# Patient Record
Sex: Male | Born: 1975
Health system: Southern US, Community
[De-identification: ages and names within clinical notes are randomized; demographics above are authoritative.]

## PROBLEM LIST (undated history)

## (undated) DIAGNOSIS — I10 Essential (primary) hypertension: Secondary | ICD-10-CM

## (undated) DIAGNOSIS — M5134 Other intervertebral disc degeneration, thoracic region: Secondary | ICD-10-CM

## (undated) DIAGNOSIS — E785 Hyperlipidemia, unspecified: Secondary | ICD-10-CM

## (undated) DIAGNOSIS — G4730 Sleep apnea, unspecified: Secondary | ICD-10-CM

## (undated) DIAGNOSIS — E669 Obesity, unspecified: Secondary | ICD-10-CM

## (undated) DIAGNOSIS — G039 Meningitis, unspecified: Secondary | ICD-10-CM

## (undated) DIAGNOSIS — Z8489 Family history of other specified conditions: Secondary | ICD-10-CM

## (undated) DIAGNOSIS — R0683 Snoring: Secondary | ICD-10-CM

## (undated) DIAGNOSIS — E119 Type 2 diabetes mellitus without complications: Secondary | ICD-10-CM

## (undated) DIAGNOSIS — Z973 Presence of spectacles and contact lenses: Secondary | ICD-10-CM

## (undated) DIAGNOSIS — E8881 Metabolic syndrome: Secondary | ICD-10-CM

## (undated) DIAGNOSIS — F32A Depression, unspecified: Secondary | ICD-10-CM

## (undated) DIAGNOSIS — G43909 Migraine, unspecified, not intractable, without status migrainosus: Secondary | ICD-10-CM

## (undated) DIAGNOSIS — R7303 Prediabetes: Secondary | ICD-10-CM

## (undated) HISTORY — DX: Obesity, unspecified: E66.9

## (undated) HISTORY — DX: Migraine, unspecified, not intractable, without status migrainosus: G43.909

## (undated) HISTORY — DX: Snoring: R06.83

## (undated) HISTORY — DX: Hyperlipidemia, unspecified: E78.5

## (undated) HISTORY — DX: Metabolic syndrome: E88.810

## (undated) HISTORY — DX: Meningitis, unspecified: G03.9

## (undated) HISTORY — DX: Type 2 diabetes mellitus without complications: E11.9

## (undated) HISTORY — DX: Metabolic syndrome: E88.81

## (undated) HISTORY — PX: ABDOMINAL SURGERY: SHX537

---
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
## 2005-09-01 ENCOUNTER — Encounter: Admission: RE | Admit: 2005-09-01 | Discharge: 2005-09-01 | Payer: Self-pay | Admitting: Endocrinology

## 2008-10-02 ENCOUNTER — Encounter (INDEPENDENT_AMBULATORY_CARE_PROVIDER_SITE_OTHER): Payer: Self-pay | Admitting: Surgery

## 2008-10-03 ENCOUNTER — Inpatient Hospital Stay (HOSPITAL_COMMUNITY): Admission: EM | Admit: 2008-10-03 | Discharge: 2008-10-04 | Payer: Self-pay | Admitting: Emergency Medicine

## 2008-11-13 ENCOUNTER — Ambulatory Visit: Payer: Self-pay | Admitting: Oncology

## 2008-11-25 LAB — COMPREHENSIVE METABOLIC PANEL
ALT: 24 U/L (ref 0–53)
AST: 22 U/L (ref 0–37)
Albumin: 3.9 g/dL (ref 3.5–5.2)
Alkaline Phosphatase: 79 U/L (ref 39–117)
BUN: 12 mg/dL (ref 6–23)
CO2: 26 mEq/L (ref 19–32)
Calcium: 9.1 mg/dL (ref 8.4–10.5)
Chloride: 106 mEq/L (ref 96–112)
Creatinine, Ser: 0.94 mg/dL (ref 0.40–1.50)
Glucose, Bld: 108 mg/dL — ABNORMAL HIGH (ref 70–99)
Potassium: 3.8 mEq/L (ref 3.5–5.3)
Sodium: 139 mEq/L (ref 135–145)
Total Bilirubin: 0.6 mg/dL (ref 0.3–1.2)
Total Protein: 7.4 g/dL (ref 6.0–8.3)

## 2008-11-25 LAB — CBC WITH DIFFERENTIAL/PLATELET
BASO%: 0.9 % (ref 0.0–2.0)
Basophils Absolute: 0.1 10*3/uL (ref 0.0–0.1)
EOS%: 1.1 % (ref 0.0–7.0)
Eosinophils Absolute: 0.1 10*3/uL (ref 0.0–0.5)
HCT: 43.3 % (ref 38.4–49.9)
HGB: 14.8 g/dL (ref 13.0–17.1)
LYMPH%: 21.2 % (ref 14.0–49.0)
MCH: 29.9 pg (ref 27.2–33.4)
MCHC: 34.1 g/dL (ref 32.0–36.0)
MCV: 87.8 fL (ref 79.3–98.0)
MONO#: 0.7 10*3/uL (ref 0.1–0.9)
MONO%: 6.8 % (ref 0.0–14.0)
NEUT#: 6.7 10*3/uL — ABNORMAL HIGH (ref 1.5–6.5)
NEUT%: 70 % (ref 39.0–75.0)
Platelets: 196 10*3/uL (ref 140–400)
RBC: 4.93 10*6/uL (ref 4.20–5.82)
RDW: 13.8 % (ref 11.0–14.6)
WBC: 9.6 10*3/uL (ref 4.0–10.3)
lymph#: 2 10*3/uL (ref 0.9–3.3)

## 2008-11-25 LAB — LACTATE DEHYDROGENASE: LDH: 117 U/L (ref 94–250)

## 2008-11-26 LAB — CEA: CEA: 0.5 ng/mL (ref 0.0–5.0)

## 2010-08-24 LAB — CBC
HCT: 40.4 % (ref 39.0–52.0)
HCT: 46.8 % (ref 39.0–52.0)
Hemoglobin: 13.4 g/dL (ref 13.0–17.0)
Hemoglobin: 15.9 g/dL (ref 13.0–17.0)
MCHC: 33.1 g/dL (ref 30.0–36.0)
MCHC: 34 g/dL (ref 30.0–36.0)
MCV: 88.6 fL (ref 78.0–100.0)
MCV: 90 fL (ref 78.0–100.0)
Platelets: 168 10*3/uL (ref 150–400)
Platelets: 187 10*3/uL (ref 150–400)
RBC: 4.49 MIL/uL (ref 4.22–5.81)
RBC: 5.28 MIL/uL (ref 4.22–5.81)
RDW: 13.5 % (ref 11.5–15.5)
RDW: 13.5 % (ref 11.5–15.5)
WBC: 13.7 10*3/uL — ABNORMAL HIGH (ref 4.0–10.5)
WBC: 15.1 10*3/uL — ABNORMAL HIGH (ref 4.0–10.5)

## 2010-08-24 LAB — DIFFERENTIAL
Basophils Absolute: 0.1 10*3/uL (ref 0.0–0.1)
Basophils Relative: 1 % (ref 0–1)
Eosinophils Absolute: 0.1 10*3/uL (ref 0.0–0.7)
Eosinophils Relative: 1 % (ref 0–5)
Lymphocytes Relative: 13 % (ref 12–46)
Lymphs Abs: 2 10*3/uL (ref 0.7–4.0)
Monocytes Absolute: 1 10*3/uL (ref 0.1–1.0)
Monocytes Relative: 7 % (ref 3–12)
Neutro Abs: 11.8 10*3/uL — ABNORMAL HIGH (ref 1.7–7.7)
Neutrophils Relative %: 79 % — ABNORMAL HIGH (ref 43–77)

## 2010-08-24 LAB — URINALYSIS, ROUTINE W REFLEX MICROSCOPIC
Bilirubin Urine: NEGATIVE
Glucose, UA: NEGATIVE mg/dL
Hgb urine dipstick: NEGATIVE
Ketones, ur: NEGATIVE mg/dL
Nitrite: NEGATIVE
Protein, ur: NEGATIVE mg/dL
Specific Gravity, Urine: 1.022 (ref 1.005–1.030)
Urobilinogen, UA: 0.2 mg/dL (ref 0.0–1.0)
pH: 5 (ref 5.0–8.0)

## 2010-08-24 LAB — COMPREHENSIVE METABOLIC PANEL
ALT: 29 U/L (ref 0–53)
AST: 34 U/L (ref 0–37)
Albumin: 4.3 g/dL (ref 3.5–5.2)
Alkaline Phosphatase: 87 U/L (ref 39–117)
BUN: 11 mg/dL (ref 6–23)
CO2: 25 mEq/L (ref 19–32)
Calcium: 9.3 mg/dL (ref 8.4–10.5)
Chloride: 107 mEq/L (ref 96–112)
Creatinine, Ser: 0.84 mg/dL (ref 0.4–1.5)
GFR calc Af Amer: >60 mL/min (ref >60)
GFR calc non Af Amer: >60 mL/min (ref >60)
Glucose, Bld: 87 mg/dL (ref 70–99)
Potassium: 4.7 mEq/L (ref 3.5–5.1)
Sodium: 140 mEq/L (ref 135–145)
Total Bilirubin: 1.2 mg/dL (ref 0.3–1.2)
Total Protein: 7.6 g/dL (ref 6.0–8.3)

## 2010-08-24 LAB — CREATININE, SERUM
Creatinine, Ser: 1.04 mg/dL (ref 0.4–1.5)
GFR calc Af Amer: >60 mL/min (ref >60)
GFR calc non Af Amer: >60 mL/min (ref >60)

## 2010-08-24 LAB — POTASSIUM: Potassium: 3.7 mEq/L (ref 3.5–5.1)

## 2010-09-28 NOTE — H&P (Signed)
Christian [name], Christian [name] NO.:  [id number]   MEDICAL RECORD NO.:  [id number]          PATIENT TYPE:  EMS   LOCATION:  ED                           FACILITY:  [facility]   PHYSICIAN:  Ardeth Sportsman, MD     DATE OF BIRTH:  10/25/1975   DATE OF ADMISSION:  10/02/2008  DATE OF DISCHARGE:                              HISTORY & PHYSICAL   PRIMARY CARE PHYSICIAN:  Dr. Juleen China with [facility]  Also [name], [name]   SURGEON:  Dr. Michaell Cowing.   REFERRING PHYSICIAN:  Dr. Cathren Laine.   REASON FOR CONSULTATION:  Acute appendicitis.   HISTORY OF PRESENT ILLNESS:  Christian [name] is a 35 year old obese male with  hypertension who has no prior GI problems, but yesterday started having  severe abdominal pain that was periumbilical that intensified and  radiated to the right lower quadrant.  He went and saw his primary care  physician, who was concerned about possibility of appendicitis, and sent  the patient for evaluation.  The CT scan confirmed it.  Based on  concerns, the ER doctors have asked me to come evaluate.   The patient denies any sick contacts or travel history.  No history of  inflammatory bowel disease or bowel syndrome.  No history of prior  antibiotics.  No history of any bowel surgery in the past.  No family  history of this.   Normally, he can walk a couple of miles without any difficulty.  No  history of cardiopulmonary issues that he recalls aside from the  hypertension and obesity.  He does occasionally drink alcohol, though no  heavy drinking.  No history of pancreatitis.  No hematochezia, melena,  or dysphagia, or GERD.   PAST MEDICAL HISTORY:  1. Hypertension.  2. Obesity.  3. Anxiety.   PAST SURGICAL HISTORY:  Negative.   SOCIAL HISTORY:  No tobacco or drug use.  Occasional uses alcohol.  His  mother and wife are at bedside.  He is in a stable relationship.   FAMILY HISTORY:  Negative for any irritable bowel, bowel disease, or  GI  symptoms that he recalls or any early cardiopulmonary disease.   ALLERGIES:  None.   MEDICATIONS:  Benicar 20/12.5 daily.   REVIEW OF SYSTEMS:  As noted per HPI.  In general, no fevers or chills,  sweats, no weight gain or weight loss.  EYES:  Negative.  ENT:  Negative.  CARDIAC:  Negative.  RESPIRATORY:  Negative.  Genitourinary,  testicular, breast, heme, lymph are negative.  Cardiovascular, note he  does have hypertension but it is well controlled at this point per the  patient.  GI as noted per HPI.  Dermatologic, psych, hepatic, renal,  endocrine otherwise negative.   PHYSICAL EXAM:  VITAL SIGNS:  Temperature 98.1, blood pressure 136/97,  pulse 90, respirations 20.  Pain was 10/10 but is down to about 4/10 on  IV narcotics.  GENERAL:  He is a well-developed, well-nourished obese male lying in bed  very still, not frankly toxic, but uncomfortable.  EYES:  Pupils equal, round, and reactive to light.  Extraocular  movements intact.  Sclerae not icteric or injected.  NEUROLOGIC:  Cranial nerves 2-12 are intact.  Hand grip is 5/5 and  equal, symmetric.  No resting or intention tremors.  HEENT:  He is normocephalic.  Mucous membranes dry.  Nose and oropharynx  clear.  LYMPHATICS:  No head, neck, axillary, or groin lymphadenopathy.  CHEST:  Clear to auscultation bilaterally.  No wheeze, rales, or  rhonchi.  No pain to rub or sternal compression.  HEART:  Regular rate and rhythm.  No murmurs, rubs, or gallops.  ABDOMEN:  Obese but soft.  He is very tender in his right lower quadrant  with voluntary guarding.  He has reproduction with cough and bed shake.  No real Rovsing sign or obturator's sign or psoas sign.  EXTERNAL GENITALIA:  NEMG, No inguinal hernias.  RECTAL:  Deferred.  SKIN:  No obvious petechia.  No caput medusae.  No purpura.  PSYCH:  Pleasant and interactive.  No evidence of any dementia,  delirium, psychosis, or paranoia.   DIAGNOSTIC STUDIES:  He has a white  count of 15.1, urinalysis negative.   He has a CAT scan that shows an inflamed appendix with some stranding  near it.  I cannot definitely tell that there was a perforation  associated with this, although it is slightly suspicious.  There is no  major abscess or free air.  He has no evidence of bowel obstruction or  hernia.  His terminal ileum looks clear.  He has no evidence of any  colitis that I can tell.   ASSESSMENT AND PLAN:  A 35 year old obese male with classic history and  physical, and CAT scan findings concerning for acute appendicitis.   PLAN:  1. Admit.  2. IV antibiotics.  3. IV fluids.  4. Diagnostic laparoscopy with appendectomy.   MEDICAL DECISION MAKING:  The anatomy and physiology of the digestive  tract was explained.  Etiology of appendicitis was discussed.  Differential diagnoses discussed.  Options discussed.  Recommendations  made for diagnostic laparoscopy with appendectomy.  Risks, benefits, and  alternatives discussed.  Possibility of overnight  stay versus prolonged stay of several weeks with complications of  abscess, ileus, other differential diagnoses, and other risks were  discussed in detail as well.  The questions answered to he and his wife,  and mother, agreed to proceed.      Ardeth Sportsman, MD  Electronically Signed     SCG/MEDQ  D:  10/02/2008  T:  10/02/2008  Job:  [id number]   cc:   [name], Felicie Morn, M.D.  Fax: [phone]   Trudi Ida. Denton Lank, M.D.  Fax: [phone]

## 2010-09-28 NOTE — Op Note (Signed)
NAME:  Christian [name], Christian [name] NO.:  [id number]   MEDICAL RECORD NO.:  [id number]          PATIENT TYPE:  OBV   LOCATION:  1613                         FACILITY:  [facility]   PHYSICIAN:  Christian [name]     DATE OF BIRTH:  16-Nov-1975   DATE OF PROCEDURE:  10/02/2008  DATE OF DISCHARGE:                               OPERATIVE REPORT   PRIMARY CARE PHYSICIAN:  Christian [name], M.D., [facility] (although I think he was initially seen by Christian [name], [name]; I had a conversation with both of them.)   SURGEON:  Christian [name]   ASSISTANT:  RNFA.   PREOPERATIVE DIAGNOSIS:  Acute appendicitis.   POSTOPERATIVE DIAGNOSIS:  Acute suppurative appendicitis.   PROCEDURE PERFORMED:  Diagnostic laparoscopy with appendectomy.   ANESTHESIA:  1. General anesthesia.  2. Local anesthetic and field block around all port sites.   SPECIMEN:  Appendix.   DRAINS:  None.   ESTIMATED BLOOD LOSS:  About 30 mL.   COMPLICATIONS:  None apparent.   INDICATIONS:  Mr. Christian [name] is a pleasant 35 year old obese male who had  severe abdominal pain, diffuse and radiating to the right lower  quadrant; with history and physical and CT scan findings concerning for  acute appendicitis.   The anatomy and physiology of the digestive tract was explained.  Pathophysiology of the appendicitis was explained with its natural  history and risks.  Options were discussed and recommendations were made  for diagnostic laparoscopy with appendectomy.  The risks, benefits and  alternatives of procedure were discussed.  Questions were answered.  He  and his wife agreed to proceed.   OPERATIVE FINDINGS:  He had a short but very broad and thickened  appendix, with inflammation and mild purulence along part of the right  paracolic gutter, but no definite perforation.  His small intestine and  colon, and the rest of his abdomen appeared to be unremarkable.  There  was no evidence of any  pelvic r subhepatic  abscess.   DESCRIPTION OF PROCEDURE:  Informed consent was confirmed.  The patient  had already been  given IV Unasyn.  He had an EKG and it was within  normal limits.  He underwent general anesthesia without any difficulty.  He had a Foley catheter sterilely placed.  He had sequential pressure  devices active throughout the entire case.  His abdomen was clipped,  prepped and draped in sterile fashion.  He was positioned supine with  the left arm tucked and right arm out.  A surgical time-out confirmed  our plan.   The #5 mm port was placed in the right upper quadrant, using optical  entry technique with the patient in steep reverse Trendelenburg and  right side up.  Camera inspection revealed no intra-abdominal injury.  Under direct visualization a final port was placed into the umbilicus  and a 12 mm port was placed suprapubically away from the bladder.   Camera inspection revealed findings noted above.  The patient was  positioned head down left-side down to allow the small bowel and greater  omentum  out of the way.  It was apparent that there was purulence along  his entry to the cecum along his right paracolic gutter.  We ended up  mobilizing the ascending colon, cecum and terminal ileum mesentery off  the right paracolic gutter in lateral medial fashion.  With that, I was  able to roll the retrocecal appendix until it came into view.  It was a  very thick, short appendix with an even thicker mesentery.  Harmonic  dissection was done to ultimately skeletonize some of the adhesions to  get a better view.  A window was made in the base of the appendix, and  the appendix was transected using a vascular load laparoscopic 45  stapler to good result.  It was clamped, held for 30 seconds, fired;  held for 30 seconds and released.  The appendiceal mesentery was ligated  using ultrasonic dissection to good result.  There was a little bleeding  from the appendiceal  mesentery during dissection, but the hemostasis was  easily controlled by the end of the case.   The appendix was removed outside of the 12 port inside an EndoCatch bag.  The fascial defect was closed using a 0 Vicryl stitch, using the  laparoscopic suture passer -- given his thickened body wall.  This was  done under direct visualization without any injury.  Copious irrigation  over 4 liters was done, with clear return at the end.  Irrigation was  done down the pelvis and right subhepatic space as well.  Inspection was  made in the staple line, and it was viable and intact with no bleeding.  The mesentery was viable and no bleeding as well.  Count  was reported  accurate.  Ports were removed.  The fascial defect was tied down.  The  skin was closed using 4-0 Monocryl stitch.  Sterile dressings were  applied.  The patient extubated and sent to the recovery room in stable  condition.   I discussed the postoperative care with the patient and his wife just  prior to surgery, and again with his wife afterwards; after showing them  the postoperative plan.  Questions answered and she expressed  understanding and appreciation.      Christian [name]  Electronically Signed     SCG/MEDQ  D:  10/02/2008  T:  10/03/2008  Job:  [id number]   cc:   Christian [name], [name]   Christian [name], M.D.  Fax: [phone]   Christian [name], M.D.  Fax: [phone]

## 2014-04-19 ENCOUNTER — Emergency Department (HOSPITAL_COMMUNITY): Payer: BC Managed Care – PPO

## 2014-04-19 ENCOUNTER — Encounter (HOSPITAL_COMMUNITY): Payer: Self-pay | Admitting: Nurse Practitioner

## 2014-04-19 ENCOUNTER — Emergency Department (HOSPITAL_COMMUNITY)
Admission: EM | Admit: 2014-04-19 | Discharge: 2014-04-19 | Disposition: A | Discharge status: Home / Self Care | Payer: BC Managed Care – PPO | Attending: Emergency Medicine | Admitting: Emergency Medicine

## 2014-04-19 DIAGNOSIS — X58XXXA Exposure to other specified factors, initial encounter: Secondary | ICD-10-CM | POA: Diagnosis not present

## 2014-04-19 DIAGNOSIS — M549 Dorsalgia, unspecified: Secondary | ICD-10-CM

## 2014-04-19 DIAGNOSIS — Y929 Unspecified place or not applicable: Secondary | ICD-10-CM | POA: Insufficient documentation

## 2014-04-19 DIAGNOSIS — Y939 Activity, unspecified: Secondary | ICD-10-CM | POA: Insufficient documentation

## 2014-04-19 DIAGNOSIS — S22000A Wedge compression fracture of unspecified thoracic vertebra, initial encounter for closed fracture: Secondary | ICD-10-CM

## 2014-04-19 DIAGNOSIS — J159 Unspecified bacterial pneumonia: Secondary | ICD-10-CM | POA: Insufficient documentation

## 2014-04-19 DIAGNOSIS — R05 Cough: Secondary | ICD-10-CM | POA: Insufficient documentation

## 2014-04-19 DIAGNOSIS — Z79899 Other long term (current) drug therapy: Secondary | ICD-10-CM | POA: Insufficient documentation

## 2014-04-19 DIAGNOSIS — I10 Essential (primary) hypertension: Secondary | ICD-10-CM | POA: Diagnosis not present

## 2014-04-19 DIAGNOSIS — M545 Low back pain: Secondary | ICD-10-CM | POA: Diagnosis not present

## 2014-04-19 DIAGNOSIS — S22080A Wedge compression fracture of T11-T12 vertebra, initial encounter for closed fracture: Secondary | ICD-10-CM | POA: Insufficient documentation

## 2014-04-19 DIAGNOSIS — Y998 Other external cause status: Secondary | ICD-10-CM | POA: Insufficient documentation

## 2014-04-19 DIAGNOSIS — Z8781 Personal history of (healed) traumatic fracture: Secondary | ICD-10-CM | POA: Diagnosis not present

## 2014-04-19 DIAGNOSIS — S22070A Wedge compression fracture of T9-T10 vertebra, initial encounter for closed fracture: Secondary | ICD-10-CM | POA: Diagnosis not present

## 2014-04-19 DIAGNOSIS — J189 Pneumonia, unspecified organism: Secondary | ICD-10-CM

## 2014-04-19 DIAGNOSIS — R059 Cough, unspecified: Secondary | ICD-10-CM

## 2014-04-19 DIAGNOSIS — R52 Pain, unspecified: Secondary | ICD-10-CM | POA: Diagnosis present

## 2014-04-19 HISTORY — DX: Essential (primary) hypertension: I10

## 2014-04-19 LAB — COMPREHENSIVE METABOLIC PANEL
ALT: 23 U/L (ref 0–53)
AST: 18 U/L (ref 0–37)
Albumin: 4.1 g/dL (ref 3.5–5.2)
Alkaline Phosphatase: 92 U/L (ref 39–117)
Anion gap: 14 (ref 5–15)
BUN: 11 mg/dL (ref 6–23)
CO2: 25 mEq/L (ref 19–32)
Calcium: 9.4 mg/dL (ref 8.4–10.5)
Chloride: 102 mEq/L (ref 96–112)
Creatinine, Ser: 0.95 mg/dL (ref 0.50–1.35)
GFR calc Af Amer: >90 mL/min (ref >90)
GFR calc non Af Amer: >90 mL/min (ref >90)
Glucose, Bld: 96 mg/dL (ref 70–99)
Potassium: 4.1 mEq/L (ref 3.7–5.3)
Sodium: 141 mEq/L (ref 137–147)
Total Bilirubin: 0.3 mg/dL (ref 0.3–1.2)
Total Protein: 8 g/dL (ref 6.0–8.3)

## 2014-04-19 LAB — CBC WITH DIFFERENTIAL/PLATELET
Basophils Absolute: 0 10*3/uL (ref 0.0–0.1)
Basophils Relative: 0 % (ref 0–1)
Eosinophils Absolute: 0.1 10*3/uL (ref 0.0–0.7)
Eosinophils Relative: 2 % (ref 0–5)
HCT: 46.9 % (ref 39.0–52.0)
Hemoglobin: 15.8 g/dL (ref 13.0–17.0)
Lymphocytes Relative: 20 % (ref 12–46)
Lymphs Abs: 1.4 10*3/uL (ref 0.7–4.0)
MCH: 29.4 pg (ref 26.0–34.0)
MCHC: 33.7 g/dL (ref 30.0–36.0)
MCV: 87.2 fL (ref 78.0–100.0)
Monocytes Absolute: 1 10*3/uL (ref 0.1–1.0)
Monocytes Relative: 15 % — ABNORMAL HIGH (ref 3–12)
Neutro Abs: 4.4 10*3/uL (ref 1.7–7.7)
Neutrophils Relative %: 63 % (ref 43–77)
Platelets: 152 10*3/uL (ref 150–400)
RBC: 5.38 MIL/uL (ref 4.22–5.81)
RDW: 13 % (ref 11.5–15.5)
WBC: 7 10*3/uL (ref 4.0–10.5)

## 2014-04-19 LAB — URINALYSIS, ROUTINE W REFLEX MICROSCOPIC
Bilirubin Urine: NEGATIVE
Glucose, UA: NEGATIVE mg/dL
Hgb urine dipstick: NEGATIVE
Ketones, ur: NEGATIVE mg/dL
Leukocytes, UA: NEGATIVE
Nitrite: NEGATIVE
Protein, ur: NEGATIVE mg/dL
Specific Gravity, Urine: 1.023 (ref 1.005–1.030)
Urobilinogen, UA: 0.2 mg/dL (ref 0.0–1.0)
pH: 5.5 (ref 5.0–8.0)

## 2014-04-19 LAB — LIPASE, BLOOD: Lipase: 18 U/L (ref 11–59)

## 2014-04-19 LAB — D-DIMER, QUANTITATIVE: D-Dimer, Quant: <0.27 ug/mL-FEU (ref 0.00–0.48)

## 2014-04-19 MED ORDER — BENZONATATE 100 MG PO CAPS: 100.0000 mg | ORAL_CAPSULE | Freq: Three times a day (TID) | ORAL | Status: DC

## 2014-04-19 MED ORDER — AZITHROMYCIN 250 MG PO TABS: 250.0000 mg | ORAL_TABLET | Freq: Every day | ORAL | Status: DC

## 2014-04-19 NOTE — ED Notes (Signed)
Patient transported to CT 

## 2014-04-19 NOTE — ED Notes (Signed)
MD at bedside. 

## 2014-04-19 NOTE — Discharge Instructions (Signed)
zithromax as prescribed until all gone for infection.  Tessalon for cough.  Follow up with primary care doctor regarding your vertebral fractures. Return if worsening symptoms.   Pneumonia Pneumonia is an infection of the lungs.  CAUSES Pneumonia may be caused by bacteria or a virus. Usually, these infections are caused by breathing infectious particles into the lungs (respiratory tract). SIGNS AND SYMPTOMS   Cough.  Fever.  Chest pain.  Increased rate of breathing.  Wheezing.  Mucus production. DIAGNOSIS  If you have the common symptoms of pneumonia, your health care provider will typically confirm the diagnosis with a chest X-ray. The X-ray will show an abnormality in the lung (pulmonary infiltrate) if you have pneumonia. Other tests of your blood, urine, or sputum may be done to find the specific cause of your pneumonia. Your health care provider may also do tests (blood gases or pulse oximetry) to see how well your lungs are working. TREATMENT  Some forms of pneumonia may be spread to other people when you cough or sneeze. You may be asked to wear a mask before and during your exam. Pneumonia that is caused by bacteria is treated with antibiotic medicine. Pneumonia that is caused by the influenza virus may be treated with an antiviral medicine. Most other viral infections must run their course. These infections will not respond to antibiotics.  HOME CARE INSTRUCTIONS   Cough suppressants may be used if you are losing too much rest. However, coughing protects you by clearing your lungs. You should avoid using cough suppressants if you can.  Your health care provider may have prescribed medicine if he or she thinks your pneumonia is caused by bacteria or influenza. Finish your medicine even if you start to feel better.  Your health care provider may also prescribe an expectorant. This loosens the mucus to be coughed up.  Take medicines only as directed by your health care  provider.  Do not smoke. Smoking is a common cause of bronchitis and can contribute to pneumonia. If you are a smoker and continue to smoke, your cough may last several weeks after your pneumonia has cleared.  A cold steam vaporizer or humidifier in your room or home may help loosen mucus.  Coughing is often worse at night. Sleeping in a semi-upright position in a recliner or using a couple pillows under your head will help with this.  Get rest as you feel it is needed. Your body will usually let you know when you need to rest. PREVENTION A pneumococcal shot (vaccine) is available to prevent a common bacterial cause of pneumonia. This is usually suggested for:  People over 36 years old.  Patients on chemotherapy.  People with chronic lung problems, such as bronchitis or emphysema.  People with immune system problems. If you are over 65 or have a high risk condition, you may receive the pneumococcal vaccine if you have not received it before. In some countries, a routine influenza vaccine is also recommended. This vaccine can help prevent some cases of pneumonia.You may be offered the influenza vaccine as part of your care. If you smoke, it is time to quit. You may receive instructions on how to stop smoking. Your health care provider can provide medicines and counseling to help you quit. SEEK MEDICAL CARE IF: You have a fever. SEEK IMMEDIATE MEDICAL CARE IF:   Your illness becomes worse. This is especially true if you are elderly or weakened from any other disease.  You cannot control your cough with  suppressants and are losing sleep.  You begin coughing up blood.  You develop pain which is getting worse or is uncontrolled with medicines.  Any of the symptoms which initially brought you in for treatment are getting worse rather than better.  You develop shortness of breath or chest pain. MAKE SURE YOU:   Understand these instructions.  Will watch your condition.  Will get  help right away if you are not doing well or get worse. Document Released: 05/02/2005 Document Revised: 09/16/2013 Document Reviewed: 07/22/2010 [facility] Patient Information 2015 [place], [place]. This information is not intended to replace advice given to you by your health care provider. Make sure you discuss any questions you have with your health care provider.   Back, Compression Fracture A compression fracture happens when a force is put upon the length of your spine. Slipping and falling on your bottom are examples of such a force. When this happens, sometimes the force is great enough to compress the building blocks (vertebral bodies) of your spine. Although this causes a lot of pain, this can usually be treated at home, unless your caregiver feels hospitalization is needed for pain control. Your backbone (spinal column) is made up of 24 main vertebral bodies in addition to the sacrum and coccyx (see illustration). These are held together by tough fibrous tissues (ligaments) and by support of your muscles. Nerve roots pass through the openings between the vertebrae. A sudden wrenching move, injury, or a fall may cause a compression fracture of one of the vertebral bodies. This may result in back pain or spread of pain into the belly (abdomen), the buttocks, and down the leg into the foot. Pain may also be created by muscle spasm alone. Large studies have been undertaken to determine the best possible course of action to help your back following injury and also to prevent future problems. The recommendations are as follows. FOLLOWING A COMPRESSION FRACTURE: Do the following only if advised by your caregiver.   If a back brace has been suggested or provided, wear it as directed.  Do not stop wearing the back brace unless instructed by your caregiver.  When allowed to return to regular activities, avoid a sedentary lifestyle. Actively exercise. Sporadic weekend binges of tennis, racquetball, or  waterskiing may actually aggravate or create problems, especially if you are not in condition for that activity.  Avoid sports requiring sudden body movements until you are in condition for them. Swimming and walking are safer activities.  Maintain good posture.  Avoid obesity.  If not already done, you should have a DEXA scan. Based on the results, be treated for osteoporosis. FOLLOWING ACUTE (SUDDEN) INJURY:  Only take over-the-counter or prescription medicines for pain, discomfort, or fever as directed by your caregiver.  Use bed rest for only the most extreme acute episode. Prolonged bed rest may aggravate your condition. Ice used for acute conditions is effective. Use a large plastic bag filled with ice. Wrap it in a towel. This also provides excellent pain relief. This may be continuous. Or use it for 30 minutes every 2 hours during acute phase, then as needed. Heat for 30 minutes prior to activities is helpful.  As soon as the acute phase (the time when your back is too painful for you to do normal activities) is over, it is important to resume normal activities and work Arboriculturisthardening programs. Back injuries can cause potentially marked changes in lifestyle. So it is important to attack these problems aggressively.  See your caregiver  for continued problems. He or she can help or refer you for appropriate exercises, physical therapy, and work hardening if needed.  If you are given narcotic medications for your condition, for the next 24 hours do not:  Drive.  Operate machinery or power tools.  Sign legal documents.  Do not drink alcohol, or take sleeping pills or other medications that may interfere with treatment. If your caregiver has given you a follow-up appointment, it is very important to keep that appointment. Not keeping the appointment could result in a chronic or permanent injury, pain, and disability. If there is any problem keeping the appointment, you must call back to  this facility for assistance.  SEEK IMMEDIATE MEDICAL CARE IF:  You develop numbness, tingling, weakness, or problems with the use of your arms or legs.  You develop severe back pain not relieved with medications.  You have changes in bowel or bladder control.  You have increasing pain in any areas of the body. Document Released: 05/02/2005 Document Revised: 09/16/2013 Document Reviewed: 12/05/2007 [facility] Patient Information 2015 [place], [place]. This information is not intended to replace advice given to you by your health care provider. Make sure you discuss any questions you have with your health care provider.

## 2014-04-19 NOTE — ED Notes (Signed)
Hes had nausea, body aches, chills, nonproductive cough since Wednesday. He has not vomited. He called his PCP and they told him to come to ED for further workup. He is concerned because he had similar pain 5 years ago and had to have emergency abdominal surgery

## 2014-04-19 NOTE — ED Notes (Signed)
Patient transported to X-ray 

## 2014-04-19 NOTE — ED Provider Notes (Signed)
CSN: [id number]     Arrival date & time 04/19/14  1123 History   First MD Initiated Contact with Patient 04/19/14 1144     Chief Complaint  Patient presents with  . Generalized Body Aches     (Consider location/radiation/quality/duration/timing/severity/associated sxs/prior Treatment) HPI Christian [name] is a 38 y.o. male with history of hypertension, presents to emergency department complaining of flulike symptoms, body aches, chills, cough and back pain. Patient states his symptoms began approximately 4 days ago. He did not check his temperature at home, but states he had body aches and chills as if he was having a fever. He denies any nasal congestion or sore throat, but states he was coughing. States cough is productive. He denies any abdominal pain, no nausea or vomiting, states has had some diarrhea. States that he was not going to come here however he persistently having upper mid back pain, which he has never had before, and he became concerned. He reports history of similar symptoms only with lower back pain about 5 years ago, and states he had to have an emergent surgery for precancerous ruptured cysts in his abdomen. Patient denies chest pain or shortness of breath. He took ibuprofen for his symptoms, last taken at 8 AM this morning  Past Medical History  Diagnosis Date  . Hypertension    Past Surgical History  Procedure Laterality Date  . Abdominal surgery     History reviewed. No pertinent family history. History  Substance Use Topics  . Smoking status: Never Smoker   . Smokeless tobacco: Not on file  . Alcohol Use: Yes    Review of Systems  Constitutional: Positive for chills.  HENT: Negative.   Respiratory: Positive for cough. Negative for chest tightness and shortness of breath.   Cardiovascular: Negative for chest pain, palpitations and leg swelling.  Gastrointestinal: Negative for nausea, vomiting, abdominal pain, diarrhea and abdominal distention.   Genitourinary: Negative for dysuria, urgency, frequency and hematuria.  Musculoskeletal: Positive for myalgias and back pain. Negative for neck pain and neck stiffness.  Skin: Negative for rash.  Allergic/Immunologic: Negative for immunocompromised state.  Neurological: Negative for dizziness, weakness, light-headedness, numbness and headaches.      Allergies  Review of patient's allergies indicates no known allergies.  Home Medications   Prior to Admission medications   Medication Sig Start Date End Date Taking? Authorizing Provider  PRESCRIPTION MEDICATION Take 1 tablet by mouth daily. Blood pressure medication   Yes Historical Provider, MD  QSYMIA 11.25-69 MG CP24 Take 1 tablet by mouth daily. 03/18/14  Yes Historical Provider, MD   BP 123/80 mmHg  Pulse 76  Temp(Src) 97.8 F (36.6 C) (Oral)  Resp 16  SpO2 97% Physical Exam  Constitutional: He is oriented to person, place, and time. He appears well-developed and well-nourished. No distress.  HENT:  Head: Normocephalic and atraumatic.  Eyes: Conjunctivae are normal.  Neck: Neck supple.  Cardiovascular: Normal rate, regular rhythm and normal heart sounds.   Pulmonary/Chest: Effort normal. No respiratory distress. He has no wheezes. He has no rales.  Abdominal: Soft. Bowel sounds are normal. He exhibits no distension. There is no tenderness. There is no rebound.  Musculoskeletal: He exhibits no edema.  No tenderness to palpation over thoracic or lumbar spine midline or paraspinal muscles  Neurological: He is alert and oriented to person, place, and time.  5/5 and equal upper and lower extremity strength bilaterally. Equal grip strength bilaterally. Normal finger to nose and heel to shin. No pronator drift.  Patellar reflexes 2+. Gait is normal   Skin: Skin is warm and dry.  Nursing note and vitals reviewed.   ED Course  Procedures (including critical care time) Labs Review Labs Reviewed  CBC WITH DIFFERENTIAL -  Abnormal; Notable for the following:    Monocytes Relative 15 (*)    All other components within normal limits  COMPREHENSIVE METABOLIC PANEL  LIPASE, BLOOD  URINALYSIS, ROUTINE W REFLEX MICROSCOPIC  D-DIMER, QUANTITATIVE    Imaging Review Dg Chest 2 View  04/19/2014   CLINICAL DATA:  Cough.  Centralized stabbing back pain.  Nausea.  EXAM: CHEST  2 VIEW  COMPARISON:  None.  FINDINGS: Normal cardiac and mediastinal contours. Low lung volumes. No consolidative pulmonary opacities. No pleural effusion or pneumothorax. Mild eventration right hemidiaphragm. Age-indeterminate wedge compression deformities of multiple mid and lower thoracic vertebral bodies.  IMPRESSION: No acute cardiopulmonary process.  Multiple age-indeterminate anterior wedge compression deformities of multiple mid and lower thoracic vertebral bodies.   Electronically Signed   By: Annia Belt M.D.   On: 04/19/2014 13:37   Ct Thoracic Spine Wo Contrast  04/19/2014   CLINICAL DATA:  Initial evaluation for back pain nausea chills non productive cough for 3 days, which fractures seen on chest x-ray, no known injury  EXAM: CT THORACIC SPINE WITHOUT CONTRAST  TECHNIQUE: Multidetector CT imaging of the thoracic spine was performed without intravenous contrast administration. Multiplanar CT image reconstructions were also generated.  COMPARISON:  Chest radiograph 04/19/2014  FINDINGS: Normal alignment. Upper thoracic spine shows minimal spondylosis. Central thoracic spine shows mild to moderate spondylosis.  No acute fractures. Minimal anterior wedging at T9, T10, T11, and T12 with no fracture lines evident. No retropulsion. There is medial right lower lobe dense consolidation with air bronchograms.  IMPRESSION: 1. Mild compression deformity second second levels from T9-T12 show in appearance most suggestive of subacute to chronic process. MRI would be most helpful to determine acuity. 2. Right lower lobe consolidation suggesting pneumonia    Electronically Signed   By: Esperanza Heir M.D.   On: 04/19/2014 15:42     EKG Interpretation None      MDM   Final diagnoses:  Cough  Back pain  Thoracic compression fracture, closed, initial encounter  CAP (community acquired pneumonia)    Patient with symptoms and thoracic back pain. Will get labs, chest x-ray, monitor. Normal, afebrile here.   Chest x-ray showing multiple thoracic vertebral fractures. Patient denies any recent injuries. States he did have a injury when he fell out of the back of a cart 20 years ago and hit his back. Otherwise no back pain or back injuries. Will get CT for further evaluation   CT showing T9-T12 mild compression deformities most suggestive of subacute to chronic process. Recommend an MRI, I do not think patient needs MRI on an emergent basis at this time. He does have a primary care doctor and follow-up with him. Also has a orthopedic specialist that he states already called and he will see him in the office next week. Patient's CT also showed right lower lobe pneumonia. This would explain his cough and body aches. Will start on Zithromax. Follow-up with primary care doctor. Vital signs normal, he is not hypoxic, not tachycardic, doubt septic.  Filed Vitals:   04/19/14 1330 04/19/14 1400 04/19/14 1430 04/19/14 1640  BP: 115/62 122/70 131/67 134/79  Pulse: 60 67 67 76  Temp:    98.4 F (36.9 C)  TempSrc:    Oral  Resp:  16  SpO2: 96% 99% 99% 97%       Lottie Musselatyana A Artie Takayama, PA-C 04/19/14 1656  Kristen N Ward, DO 04/19/14 1658

## 2014-04-19 NOTE — ED Notes (Signed)
PA at bedside.

## 2014-05-16 HISTORY — PX: UMBILICAL HERNIA REPAIR: SHX196

## 2016-03-09 DIAGNOSIS — N509 Disorder of male genital organs, unspecified: Secondary | ICD-10-CM | POA: Diagnosis not present

## 2016-03-29 DIAGNOSIS — I10 Essential (primary) hypertension: Secondary | ICD-10-CM | POA: Diagnosis not present

## 2016-03-29 DIAGNOSIS — R7301 Impaired fasting glucose: Secondary | ICD-10-CM | POA: Diagnosis not present

## 2016-03-29 DIAGNOSIS — Z0000 Encounter for general adult medical examination without abnormal findings: Secondary | ICD-10-CM | POA: Diagnosis not present

## 2016-04-01 DIAGNOSIS — N410 Acute prostatitis: Secondary | ICD-10-CM | POA: Diagnosis not present

## 2016-04-05 DIAGNOSIS — Z6841 Body Mass Index (BMI) 40.0 and over, adult: Secondary | ICD-10-CM | POA: Diagnosis not present

## 2016-04-05 DIAGNOSIS — Z1389 Encounter for screening for other disorder: Secondary | ICD-10-CM | POA: Diagnosis not present

## 2016-04-05 DIAGNOSIS — E8881 Metabolic syndrome: Secondary | ICD-10-CM | POA: Diagnosis not present

## 2016-04-05 DIAGNOSIS — R7301 Impaired fasting glucose: Secondary | ICD-10-CM | POA: Diagnosis not present

## 2016-04-05 DIAGNOSIS — I10 Essential (primary) hypertension: Secondary | ICD-10-CM | POA: Diagnosis not present

## 2016-04-05 DIAGNOSIS — Z0000 Encounter for general adult medical examination without abnormal findings: Secondary | ICD-10-CM | POA: Diagnosis not present

## 2016-04-05 DIAGNOSIS — G43909 Migraine, unspecified, not intractable, without status migrainosus: Secondary | ICD-10-CM | POA: Diagnosis not present

## 2016-04-13 DIAGNOSIS — N509 Disorder of male genital organs, unspecified: Secondary | ICD-10-CM | POA: Diagnosis not present

## 2016-05-23 DIAGNOSIS — F4325 Adjustment disorder with mixed disturbance of emotions and conduct: Secondary | ICD-10-CM | POA: Diagnosis not present

## 2016-06-15 DIAGNOSIS — F4322 Adjustment disorder with anxiety: Secondary | ICD-10-CM | POA: Diagnosis not present

## 2016-08-11 DIAGNOSIS — Z6841 Body Mass Index (BMI) 40.0 and over, adult: Secondary | ICD-10-CM | POA: Diagnosis not present

## 2016-08-11 DIAGNOSIS — G43909 Migraine, unspecified, not intractable, without status migrainosus: Secondary | ICD-10-CM | POA: Diagnosis not present

## 2016-08-11 DIAGNOSIS — I10 Essential (primary) hypertension: Secondary | ICD-10-CM | POA: Diagnosis not present

## 2016-08-11 DIAGNOSIS — F418 Other specified anxiety disorders: Secondary | ICD-10-CM | POA: Diagnosis not present

## 2016-10-17 DIAGNOSIS — F4322 Adjustment disorder with anxiety: Secondary | ICD-10-CM | POA: Diagnosis not present

## 2016-10-24 DIAGNOSIS — F4322 Adjustment disorder with anxiety: Secondary | ICD-10-CM | POA: Diagnosis not present

## 2016-10-31 DIAGNOSIS — F4322 Adjustment disorder with anxiety: Secondary | ICD-10-CM | POA: Diagnosis not present

## 2016-11-14 DIAGNOSIS — F4322 Adjustment disorder with anxiety: Secondary | ICD-10-CM | POA: Diagnosis not present

## 2016-11-28 DIAGNOSIS — F4322 Adjustment disorder with anxiety: Secondary | ICD-10-CM | POA: Diagnosis not present

## 2017-02-17 DIAGNOSIS — M546 Pain in thoracic spine: Secondary | ICD-10-CM | POA: Diagnosis not present

## 2017-02-25 DIAGNOSIS — M546 Pain in thoracic spine: Secondary | ICD-10-CM | POA: Diagnosis not present

## 2017-03-14 DIAGNOSIS — M546 Pain in thoracic spine: Secondary | ICD-10-CM | POA: Diagnosis not present

## 2017-03-16 DIAGNOSIS — M546 Pain in thoracic spine: Secondary | ICD-10-CM | POA: Diagnosis not present

## 2017-03-20 DIAGNOSIS — M25532 Pain in left wrist: Secondary | ICD-10-CM | POA: Diagnosis not present

## 2017-03-20 DIAGNOSIS — G5601 Carpal tunnel syndrome, right upper limb: Secondary | ICD-10-CM | POA: Diagnosis not present

## 2017-03-23 DIAGNOSIS — G5601 Carpal tunnel syndrome, right upper limb: Secondary | ICD-10-CM | POA: Diagnosis not present

## 2017-04-04 DIAGNOSIS — M5134 Other intervertebral disc degeneration, thoracic region: Secondary | ICD-10-CM | POA: Diagnosis not present

## 2017-04-18 DIAGNOSIS — M546 Pain in thoracic spine: Secondary | ICD-10-CM | POA: Diagnosis not present

## 2017-05-02 DIAGNOSIS — M5134 Other intervertebral disc degeneration, thoracic region: Secondary | ICD-10-CM | POA: Diagnosis not present

## 2017-05-02 DIAGNOSIS — M546 Pain in thoracic spine: Secondary | ICD-10-CM | POA: Diagnosis not present

## 2017-05-03 DIAGNOSIS — R7301 Impaired fasting glucose: Secondary | ICD-10-CM | POA: Diagnosis not present

## 2017-05-03 DIAGNOSIS — E8881 Metabolic syndrome: Secondary | ICD-10-CM | POA: Diagnosis not present

## 2017-05-03 DIAGNOSIS — I10 Essential (primary) hypertension: Secondary | ICD-10-CM | POA: Diagnosis not present

## 2017-05-03 DIAGNOSIS — Z1389 Encounter for screening for other disorder: Secondary | ICD-10-CM | POA: Diagnosis not present

## 2017-07-11 DIAGNOSIS — F439 Reaction to severe stress, unspecified: Secondary | ICD-10-CM | POA: Diagnosis not present

## 2017-07-18 DIAGNOSIS — F439 Reaction to severe stress, unspecified: Secondary | ICD-10-CM | POA: Diagnosis not present

## 2017-11-03 DIAGNOSIS — I10 Essential (primary) hypertension: Secondary | ICD-10-CM | POA: Diagnosis not present

## 2017-11-03 DIAGNOSIS — R82998 Other abnormal findings in urine: Secondary | ICD-10-CM | POA: Diagnosis not present

## 2017-11-03 DIAGNOSIS — R7301 Impaired fasting glucose: Secondary | ICD-10-CM | POA: Diagnosis not present

## 2018-01-18 DIAGNOSIS — M5134 Other intervertebral disc degeneration, thoracic region: Secondary | ICD-10-CM | POA: Diagnosis not present

## 2018-02-01 DIAGNOSIS — E8881 Metabolic syndrome: Secondary | ICD-10-CM | POA: Diagnosis not present

## 2018-02-01 DIAGNOSIS — Z125 Encounter for screening for malignant neoplasm of prostate: Secondary | ICD-10-CM | POA: Diagnosis not present

## 2018-02-01 DIAGNOSIS — E7849 Other hyperlipidemia: Secondary | ICD-10-CM | POA: Diagnosis not present

## 2018-02-01 DIAGNOSIS — I10 Essential (primary) hypertension: Secondary | ICD-10-CM | POA: Diagnosis not present

## 2018-02-01 DIAGNOSIS — Z1389 Encounter for screening for other disorder: Secondary | ICD-10-CM | POA: Diagnosis not present

## 2018-02-01 DIAGNOSIS — R7301 Impaired fasting glucose: Secondary | ICD-10-CM | POA: Diagnosis not present

## 2018-02-01 DIAGNOSIS — Z0000 Encounter for general adult medical examination without abnormal findings: Secondary | ICD-10-CM | POA: Diagnosis not present

## 2018-02-20 DIAGNOSIS — M5134 Other intervertebral disc degeneration, thoracic region: Secondary | ICD-10-CM | POA: Diagnosis not present

## 2018-05-11 DIAGNOSIS — M5033 Other cervical disc degeneration, cervicothoracic region: Secondary | ICD-10-CM | POA: Diagnosis not present

## 2018-11-07 DIAGNOSIS — R7301 Impaired fasting glucose: Secondary | ICD-10-CM | POA: Diagnosis not present

## 2018-11-07 DIAGNOSIS — G43909 Migraine, unspecified, not intractable, without status migrainosus: Secondary | ICD-10-CM | POA: Diagnosis not present

## 2018-11-07 DIAGNOSIS — E785 Hyperlipidemia, unspecified: Secondary | ICD-10-CM | POA: Diagnosis not present

## 2018-11-15 DIAGNOSIS — M5134 Other intervertebral disc degeneration, thoracic region: Secondary | ICD-10-CM | POA: Diagnosis not present

## 2019-01-16 DIAGNOSIS — E8881 Metabolic syndrome: Secondary | ICD-10-CM | POA: Diagnosis not present

## 2019-01-16 DIAGNOSIS — R7301 Impaired fasting glucose: Secondary | ICD-10-CM | POA: Diagnosis not present

## 2019-01-16 DIAGNOSIS — I10 Essential (primary) hypertension: Secondary | ICD-10-CM | POA: Diagnosis not present

## 2019-01-23 ENCOUNTER — Other Ambulatory Visit: Payer: Self-pay

## 2019-01-23 ENCOUNTER — Ambulatory Visit (INDEPENDENT_AMBULATORY_CARE_PROVIDER_SITE_OTHER): Payer: BC Managed Care – PPO | Admitting: Neurology

## 2019-01-23 ENCOUNTER — Encounter: Payer: Self-pay | Admitting: Neurology

## 2019-01-23 VITALS — BP 143/99 | HR 75 | Ht 68.0 in | Wt 277.0 lb

## 2019-01-23 DIAGNOSIS — R0683 Snoring: Secondary | ICD-10-CM

## 2019-01-23 DIAGNOSIS — R519 Headache, unspecified: Secondary | ICD-10-CM

## 2019-01-23 DIAGNOSIS — G4719 Other hypersomnia: Secondary | ICD-10-CM

## 2019-01-23 DIAGNOSIS — Z6841 Body Mass Index (BMI) 40.0 and over, adult: Secondary | ICD-10-CM

## 2019-01-23 DIAGNOSIS — R51 Headache: Secondary | ICD-10-CM

## 2019-01-23 DIAGNOSIS — Z820 Family history of epilepsy and other diseases of the nervous system: Secondary | ICD-10-CM | POA: Diagnosis not present

## 2019-01-23 NOTE — Progress Notes (Signed)
Subjective:    Patient ID: Christian [name] is a 43 y.o. male.  HPI     Huston Foley, MD, PhD [facility] [address], [address] [address] [place], [place] [address]   Dear Dr. Clelia Croft,   I saw your patient, Christian [name], upon your kind request in my sleep clinic today for initial consultation of his sleep disorder, in particular, concern for underlying obstructive sleep apnea.  The patient is unaccompanied today.  As you know, Christian [name] is a 43 year old right-handed gentleman with an underlying medical history of hypertension, impaired fasting glucose, migraine headaches, anxiety, and morbid obesity with a BMI of over 40, who reports snoring and excessive daytime somnolence.  I reviewed your office record from 01/16/2019 when he saw Tommy Medal, Pharm.D.  His Epworth sleepiness score is 18 out of 24, fatigue severity score is 41 out of 63.  He had a sleep study several years ago but is never been on CPAP therapy.  Since the COVID-19 pandemic he has had a more erratic sleep schedule, generally, he tries to be in bed between 10 PM and 11:30 PM, rise time is currently around 6 or 6:15 AM.  He has woken himself up with a sense of gasping for air but more so with sharp frontal headaches which are short-lived but do wake him up in the mornings.  He has a family history of sleep apnea in his mother who has a CPAP machine.  He has discomfort at night especially in his mid and lower back, he has been seeing Dr. Ethelene Hal and has received injections into the back since 2019 which have been helpful, more so after the first time.  He is a non-smoker, he drinks alcohol rarely, caffeine daily in the form of soda, 2-3 diet soda cans per day on average.  He would be willing to consider a CPAP machine but would rather use an oral appliance.  He has gained weight.  Since his sleep study several years ago he estimates that he is about 40 pounds heavier.  He works as a Writer.  He has 3  children, ages 75 year old son, 63 year old daughter and 58-year-old son.  His weight has been fluctuating, up to 20 pounds up or down.  Since the COVID-19 pandemic he has gained quite a bit of weight he reports.  He denies any telltale symptoms of restless leg syndrome.  He does not have night to night nocturia.   His Past Medical History Is Significant For: Past Medical History:  Diagnosis Date  . Diabetes (HCC)   . Hypertension   . Meningitis   . Metabolic syndrome   . Migraine headache   . Obesity   . Snoring     His Past Surgical History Is Significant For: Past Surgical History:  Procedure Laterality Date  . ABDOMINAL SURGERY    . APPENDECTOMY    . UMBILICAL HERNIA REPAIR      His Family History Is Significant For: Family History  Problem Relation Age of Onset  . Fibromyalgia Mother   . Neurodegenerative disease Mother   . CVA Father   . CAD Father   . Diabetes Father   . Hypertension Father   . Obesity Father   . Liver cancer Maternal Grandfather   . Heart attack Maternal Grandfather   . Lung disease Paternal Grandmother   . Heart attack Paternal Grandfather   . Diabetes Paternal Grandfather     His Social History Is Significant For: Social History  Socioeconomic History  . Marital status: Married    Spouse name: Not on file  . Number of children: Not on file  . Years of education: Not on file  . Highest education level: Not on file  Occupational History  . Not on file  Social Needs  . Financial resource strain: Not on file  . Food insecurity    Worry: Not on file    Inability: Not on file  . Transportation needs    Medical: Not on file    Non-medical: Not on file  Tobacco Use  . Smoking status: Never Smoker  . Smokeless tobacco: Never Used  Substance and Sexual Activity  . Alcohol use: Yes    Comment: Occassional Beer   . Drug use: No  . Sexual activity: Not on file  Lifestyle  . Physical activity    Days per week: Not on file    Minutes  per session: Not on file  . Stress: Not on file  Relationships  . Social Musicianconnections    Talks on phone: Not on file    Gets together: Not on file    Attends religious service: Not on file    Active member of club or organization: Not on file    Attends meetings of clubs or organizations: Not on file    Relationship status: Not on file  Other Topics Concern  . Not on file  Social History Narrative  . Not on file    His Allergies Are:  No Known Allergies:   His Current Medications Are:  Outpatient Encounter Medications as of 01/23/2019  Medication Sig  . lisinopril-hydrochlorothiazide (ZESTORETIC) 10-12.5 MG tablet Take 1 tablet by mouth daily.  Marland Kitchen. PRESCRIPTION MEDICATION Take 1 tablet by mouth daily. Blood pressure medication  . Semaglutide (OZEMPIC, 1 MG/DOSE, [place]) Inject into the skin once a week.  . topiramate (TOPAMAX) 50 MG tablet Take 50 mg by mouth 2 (two) times daily.  . [DISCONTINUED] azithromycin (ZITHROMAX) 250 MG tablet Take 1 tablet (250 mg total) by mouth daily. Take first 2 tablets together, then 1 every day until finished.  . [DISCONTINUED] benzonatate (TESSALON) 100 MG capsule Take 1 capsule (100 mg total) by mouth every 8 (eight) hours.  . [DISCONTINUED] QSYMIA 11.25-69 MG CP24 Take 1 tablet by mouth daily.   No facility-administered encounter medications on file as of 01/23/2019.   :  Review of Systems:  Out of a complete 14 point review of systems, all are reviewed and negative with the exception of these symptoms as listed below: Review of Systems  Neurological:       Pt presents today to discuss his sleep. Pt has had a sleep study about 15 years ago but never started a cpap. Pt does endorse snoring.  Epworth Sleepiness Scale 0= would never doze 1= slight chance of dozing 2= moderate chance of dozing 3= high chance of dozing  Sitting and reading: 3 Watching TV: 1 Sitting inactive in a public place (ex. Theater or meeting): 3 As a passenger in a car for an  hour without a break: 2 Lying down to rest in the afternoon: 1 Sitting and talking to someone: 2 Sitting quietly after lunch (no alcohol): 3 In a car, while stopped in traffic: 3 Total: 18     Objective:  Neurological Exam  Physical Exam Physical Examination:   Vitals:   01/23/19 1051  BP: (!) 143/99  Pulse: 75    General Examination: The patient is a very pleasant 43 y.o.  male in no acute distress. He appears well-developed and well-nourished and well groomed.   HEENT: Normocephalic, atraumatic, pupils are equal, round and reactive to light and accommodation. Funduscopic exam is normal with sharp disc margins noted. Extraocular tracking is good without limitation to gaze excursion or nystagmus noted. Normal smooth pursuit is noted. Hearing is grossly intact. Tympanic membranes are clear bilaterally. Face is symmetric with normal facial animation and normal facial sensation. Speech is clear with no dysarthria noted. There is no hypophonia. There is no lip, neck/head, jaw or voice tremor. Neck is supple with full range of passive and active motion. There are no carotid bruits on auscultation. Oropharynx exam reveals: mild mouth dryness, adequate dental hygiene and Moderate to market airway crowding secondary to tonsillar size of 2-3+ bilaterally, Mallampati class III, larger tongue and larger uvula.  Tongue protrudes centrally in palate elevates symmetrically.  He has a minimal overbite.  Nasal inspection reveals somewhat narrow nasal passages and slightly deviated septum to the top right.  He reports having some difficulty breathing through his left nostril typically.  Chest: Clear to auscultation without wheezing, rhonchi or crackles noted.  Heart: S1+S2+0, regular and normal without murmurs, rubs or gallops noted.   Abdomen: Soft, non-tender and non-distended with normal bowel sounds appreciated on auscultation.  Extremities: There is no pitting edema in the distal lower extremities  bilaterally. Pedal pulses are intact.  Skin: Warm and dry without trophic changes noted. There are no varicose veins.  Musculoskeletal: exam reveals no obvious joint deformities, tenderness or joint swelling or erythema.   Neurologically:  Mental status: The patient is awake, alert and oriented in all 4 spheres. His immediate and remote memory, attention, language skills and fund of knowledge are appropriate. There is no evidence of aphasia, agnosia, apraxia or anomia. Speech is clear with normal prosody and enunciation. Thought process is linear. Mood is normal and affect is normal.  Cranial nerves II - XII are as described above under HEENT exam. In addition: shoulder shrug is normal with equal shoulder height noted. Motor exam: Normal bulk, strength and tone is noted. There is no tremor, Romberg is negative. Fine motor skills and coordination: intact with normal finger taps, normal hand movements, normal rapid alternating patting, normal foot taps and normal foot agility.  Cerebellar testing: No dysmetria or intention tremor. There is no truncal or gait ataxia.  Sensory exam: intact to light touch in the upper and lower extremities.  Gait, station and balance: He stands easily. No veering to one side is noted. No leaning to one side is noted. Posture is age-appropriate and stance is narrow based. Gait shows normal stride length and normal pace. No problems turning are noted. Tandem walk is unremarkable.   Assessment and Plan:   In summary, Christian [name] is a very pleasant 43 y.o.-year old male with an underlying medical history of hypertension, impaired fasting glucose, migraine headaches, anxiety, and morbid obesity with a BMI of over 40, whose history and physical exam are concerning for obstructive sleep apnea (OSA). I had a long chat with the patient about my findings and the diagnosis of OSA, its prognosis and treatment options. We talked about medical treatments, surgical interventions  and non-pharmacological approaches. I explained in particular the risks and ramifications of untreated moderate to severe OSA, especially with respect to developing cardiovascular disease down the Road, including congestive heart failure, difficult to treat hypertension, cardiac arrhythmias, or stroke. Even type 2 diabetes has, in part, been linked to untreated OSA.  Symptoms of untreated OSA include daytime sleepiness, memory problems, mood irritability and mood disorder such as depression and anxiety, lack of energy, as well as recurrent headaches, especially morning headaches. We talked about trying to maintain a healthy lifestyle in general, as well as the importance of weight control. I encouraged the patient to eat healthy, exercise daily and keep well hydrated, to keep a scheduled bedtime and wake time routine, to not skip any meals and eat healthy snacks in between meals. I advised the patient not to drive when feeling sleepy. I recommended the following at this time: sleep study.   I explained the sleep test procedure to the patient and also outlined possible surgical and non-surgical treatment options of OSA, including the use of a custom-made dental device (which would require a referral to a specialist dentist or oral surgeon), upper airway surgical options, including UPPP and Inspire implantable device (which would involve a referral to an ENT surgeon).  I also explained the CPAP treatment option to the patient, who indicated that he would be willing to try CPAP if the need arises, but he would prefer to try an oral appl. I explained the importance of being compliant with PAP treatment, not only for insurance purposes but primarily to improve His symptoms, and for the patient's long term health benefit, including to reduce His cardiovascular risks. I answered all his questions today and the patient was in agreement. I plan to see him back after the sleep study is completed and encouraged him to call  with any interim questions, concerns, problems or updates.   Thank you very much for allowing me to participate in the care of this nice patient. If I can be of any further assistance to you please do not hesitate to call me at [phone].  Sincerely,   Huston FoleySaima Karrisa Didio, MD, PhD

## 2019-01-23 NOTE — Patient Instructions (Signed)

## 2019-01-31 DIAGNOSIS — I10 Essential (primary) hypertension: Secondary | ICD-10-CM | POA: Diagnosis not present

## 2019-01-31 DIAGNOSIS — G4440 Drug-induced headache, not elsewhere classified, not intractable: Secondary | ICD-10-CM | POA: Diagnosis not present

## 2019-01-31 DIAGNOSIS — G43709 Chronic migraine without aura, not intractable, without status migrainosus: Secondary | ICD-10-CM | POA: Diagnosis not present

## 2019-01-31 DIAGNOSIS — Z125 Encounter for screening for malignant neoplasm of prostate: Secondary | ICD-10-CM | POA: Diagnosis not present

## 2019-01-31 DIAGNOSIS — E7849 Other hyperlipidemia: Secondary | ICD-10-CM | POA: Diagnosis not present

## 2019-01-31 DIAGNOSIS — G43009 Migraine without aura, not intractable, without status migrainosus: Secondary | ICD-10-CM | POA: Diagnosis not present

## 2019-01-31 DIAGNOSIS — Z0000 Encounter for general adult medical examination without abnormal findings: Secondary | ICD-10-CM | POA: Diagnosis not present

## 2019-01-31 DIAGNOSIS — R7301 Impaired fasting glucose: Secondary | ICD-10-CM | POA: Diagnosis not present

## 2019-02-04 ENCOUNTER — Ambulatory Visit: Payer: BC Managed Care – PPO | Admitting: Neurology

## 2019-02-04 DIAGNOSIS — Z820 Family history of epilepsy and other diseases of the nervous system: Secondary | ICD-10-CM

## 2019-02-04 DIAGNOSIS — G4733 Obstructive sleep apnea (adult) (pediatric): Secondary | ICD-10-CM

## 2019-02-04 DIAGNOSIS — R519 Headache, unspecified: Secondary | ICD-10-CM

## 2019-02-04 DIAGNOSIS — G4719 Other hypersomnia: Secondary | ICD-10-CM

## 2019-02-04 DIAGNOSIS — R0683 Snoring: Secondary | ICD-10-CM

## 2019-02-07 DIAGNOSIS — R7301 Impaired fasting glucose: Secondary | ICD-10-CM | POA: Diagnosis not present

## 2019-02-07 DIAGNOSIS — I10 Essential (primary) hypertension: Secondary | ICD-10-CM | POA: Diagnosis not present

## 2019-02-07 DIAGNOSIS — E8881 Metabolic syndrome: Secondary | ICD-10-CM | POA: Diagnosis not present

## 2019-02-07 DIAGNOSIS — Z0000 Encounter for general adult medical examination without abnormal findings: Secondary | ICD-10-CM | POA: Diagnosis not present

## 2019-02-07 DIAGNOSIS — Z1331 Encounter for screening for depression: Secondary | ICD-10-CM | POA: Diagnosis not present

## 2019-02-07 DIAGNOSIS — E785 Hyperlipidemia, unspecified: Secondary | ICD-10-CM | POA: Diagnosis not present

## 2019-02-07 NOTE — Addendum Note (Signed)
Addended by: Star Age on: 02/07/2019 06:39 PM   Modules accepted: Orders

## 2019-02-07 NOTE — Procedures (Signed)
Patient Information     First Name: Christian Last Name: [name] ID: [id number]  Birth Date: 12-15-1975 Age: 43 Gender: Male  Referring Provider: Marton Redwood, MD BMI: 42.1 (W=278 lb, H=5' 8'')    Epworth:  18/24   Sleep Study Information    Study Date: Feb 04, 2019 S/H/A Version: 001.001.001.001 / 4.1.1528 / 60  History:    43 year old man with a history of hypertension, impaired fasting glucose, migraine headaches, anxiety, and morbid obesity with a BMI of over 40, who reports snoring and excessive daytime somnolence. Summary & Diagnosis:     OSA   Recommendations:      This home sleep test demonstrates severe obstructive sleep apnea with a total AHI of 31.5/hour and O2 nadir of 79%. Treatment with positive airway pressure (in the form of CPAP) is recommended. This will require a full night CPAP titration study for proper treatment settings, O2 monitoring and mask fitting. Based on the severity of the sleep disordered breathing an attended titration study is indicated. However, patient's insurance has denied an attended sleep study; therefore, the patient will be advised to proceed with an autoPAP titration/trial at home for now. Please note that untreated obstructive sleep apnea may carry additional perioperative morbidity. Patients with significant obstructive sleep apnea should receive perioperative PAP therapy and the surgeons and particularly the anesthesiologist should be informed of the diagnosis and the severity of the sleep disordered breathing. The patient should be cautioned not to drive, work at heights, or operate dangerous or heavy equipment when tired or sleepy. Review and reiteration of good sleep hygiene measures should be pursued with any patient. Other causes of the patient's symptoms, including circadian rhythm disturbances, an underlying mood disorder, medication effect and/or an underlying medical problem cannot be ruled out based on this test. Clinical correlation is  recommended. The patient and his referring provider will be notified of the test results. The patient will be seen in follow up in sleep clinic at [facility].  I certify that I have reviewed the raw data recording prior to the issuance of this report in accordance with the standards of the American Academy of Sleep Medicine (AASM).  Star Age, MD, PhD [facility]) Diplomat, ABPN (Neurology and Sleep)             Sleep Summary  Oxygen Saturation Statistics   Start Study Time: End Study Time: Total Recording Time:  9:52:11 PM 6:10:48 AM 8 h, 18 min  Total Sleep Time % REM of Sleep Time:  7 h, 9 min  19.3    Mean: 93 Minimum: 79 Maximum: 99  Mean of Desaturations Nadirs (%):   89  Oxygen Desaturation. %:   4-9 10-20 >20 Total  Events Number Total   124  49 71.7 28.3  0 0.0  173 100.0  Oxygen Saturation: <90 <=88 <85 <80 <70  Duration (minutes): Sleep % 21.8 5.1 13.9 2.1 3.2 0.5 0.0 0.0 0.0 0.0     Respiratory Indices      Total Events REM NREM All Night  pRDI:  239  pAHI:  225 ODI:  173  pAHIc:  27  % CSR: 0.0 29.0 23.2 5.8 8.1 34.5 33.5 28.6 3.0 33.5 31.5 24.2 3.9       Pulse Rate Statistics during Sleep (BPM)      Mean: 72 Minimum: 47 Maximum: 102    Indices are calculated using technically valid sleep time of  7 hrs, 8 min. Central-Indices are  calculated using technically valid sleep time of  6  hrs, 56 min. pRDI/pAHI are calculated using oxi desaturations ? 3%  Body Position Statistics  Position Supine Prone Right Left Non-Supine  Sleep (min) 111.0 0.0 61.0 257.5 318.5  Sleep % 25.8 0.0 14.2 60.0 74.2  pRDI 84.5 N/A 36.8 10.7 15.7  pAHI 84.5 N/A 33.8 8.2 13.0  ODI 77.4 N/A 19.9 2.3 5.7     Snoring Statistics Snoring Level (dB) >40 >50 >60 >70 >80 >Threshold (45)  Sleep (min) 301.6 23.5 1.1 0.0 0.0 49.1  Sleep % 70.2 5.5 0.3 0.0 0.0 11.4    Mean: 42 dB Sleep Stages Chart                                                 pAHI=31.5                                                            Mild              Moderate                    Severe                                                 5              15                    30

## 2019-02-07 NOTE — Progress Notes (Signed)
Patient referred by Dr. Brigitte Pulse, seen by me on 01/23/19, HST on 02/04/19.    Please call and notify the patient that the recent home sleep test showed obstructive sleep apnea in the severe range. While I recommend treatment for this in the form CPAP, his insurance will not approve a sleep study for this. They will likely only approve a trial of autoPAP, which means, that we don't have to bring him in for a sleep study with CPAP, but will let him start using a so called autoPAP machine at home, through a DME company (of his choice, or as per insurance requirement). The DME representative will educate him on how to use the machine, how to put the mask on, etc. I have placed an order in the chart. Please send referral, talk to patient, send report to referring MD. We will need a FU in sleep clinic for 10 weeks post-PAP set up, please arrange that with me or one of our NPs. Thanks,   Star Age, MD, PhD [facility])

## 2019-02-11 ENCOUNTER — Telehealth: Payer: Self-pay | Admitting: Neurology

## 2019-02-11 NOTE — Telephone Encounter (Signed)
I called pt. I advised pt that Dr. Rexene Alberts reviewed their sleep study results and found that pt has severe osa. Dr. Rexene Alberts recommends that pt start an auto pap at home. I reviewed PAP compliance expectations with the pt. Pt is agreeable to starting an auto-PAP. I advised pt that an order will be sent to a DME, [facility], and [facility] will call the pt within about one week after they file with the pt's insurance. [facility] will show the pt how to use the machine, fit for masks, and troubleshoot the auto-PAP if needed. A follow up appt was made for insurance purposes with Amy, NP on 05/02/19 at 9:00am. Pt verbalized understanding to arrive 15 minutes early and bring their auto-PAP. A letter with all of this information in it will be mailed to the pt as a reminder. I verified with the pt that the address we have on file is correct. Pt verbalized understanding of results. Pt had no questions at this time but was encouraged to call back if questions arise. I have sent the order to [facility] and have received confirmation that they have received the order.

## 2019-02-11 NOTE — Telephone Encounter (Signed)
Pt is calling in requesting sleep study results, pt is requesting a call back.

## 2019-02-11 NOTE — Telephone Encounter (Signed)
-----   Message from Star Age, MD sent at 02/07/2019  6:39 PM EDT ----- Patient referred by Dr. Brigitte Pulse, seen by me on 01/23/19, HST on 02/04/19.    Please call and notify the patient that the recent home sleep test showed obstructive sleep apnea in the severe range. While I recommend treatment for this in the form CPAP, his insurance will not approve a sleep study for this. They will likely only approve a trial of autoPAP, which means, that we don't have to bring him in for a sleep study with CPAP, but will let him start using a so called autoPAP machine at home, through a DME company (of his choice, or as per insurance requirement). The DME representative will educate him on how to use the machine, how to put the mask on, etc. I have placed an order in the chart. Please send referral, talk to patient, send report to referring MD. We will need a FU in sleep clinic for 10 weeks post-PAP set up, please arrange that with me or one of our NPs. Thanks,   Star Age, MD, PhD [facility])

## 2019-02-21 DIAGNOSIS — G4733 Obstructive sleep apnea (adult) (pediatric): Secondary | ICD-10-CM | POA: Diagnosis not present

## 2019-02-22 DIAGNOSIS — F419 Anxiety disorder, unspecified: Secondary | ICD-10-CM | POA: Diagnosis not present

## 2019-02-27 ENCOUNTER — Other Ambulatory Visit: Payer: Self-pay

## 2019-02-27 DIAGNOSIS — Z20822 Contact with and (suspected) exposure to covid-19: Secondary | ICD-10-CM

## 2019-03-01 LAB — NOVEL CORONAVIRUS, NAA: SARS-CoV-2, NAA: NOT DETECTED

## 2019-03-13 ENCOUNTER — Other Ambulatory Visit: Payer: Self-pay

## 2019-03-13 DIAGNOSIS — Z20828 Contact with and (suspected) exposure to other viral communicable diseases: Secondary | ICD-10-CM | POA: Diagnosis not present

## 2019-03-13 DIAGNOSIS — Z20822 Contact with and (suspected) exposure to covid-19: Secondary | ICD-10-CM

## 2019-03-14 LAB — NOVEL CORONAVIRUS, NAA: SARS-CoV-2, NAA: NOT DETECTED

## 2019-04-01 ENCOUNTER — Other Ambulatory Visit: Payer: Self-pay | Admitting: *Deleted

## 2019-04-01 ENCOUNTER — Ambulatory Visit (INDEPENDENT_AMBULATORY_CARE_PROVIDER_SITE_OTHER): Payer: BC Managed Care – PPO | Admitting: Mental Health

## 2019-04-01 ENCOUNTER — Other Ambulatory Visit: Payer: Self-pay

## 2019-04-01 DIAGNOSIS — F4323 Adjustment disorder with mixed anxiety and depressed mood: Secondary | ICD-10-CM

## 2019-04-01 DIAGNOSIS — Z20822 Contact with and (suspected) exposure to covid-19: Secondary | ICD-10-CM
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
## 2019-04-03 LAB — NOVEL CORONAVIRUS, NAA: SARS-CoV-2, NAA: NOT DETECTED

## 2019-04-06 DIAGNOSIS — M5134 Other intervertebral disc degeneration, thoracic region: Secondary | ICD-10-CM | POA: Diagnosis not present

## 2019-04-18 ENCOUNTER — Ambulatory Visit (INDEPENDENT_AMBULATORY_CARE_PROVIDER_SITE_OTHER): Payer: BC Managed Care – PPO | Admitting: Mental Health

## 2019-04-18 ENCOUNTER — Other Ambulatory Visit: Payer: Self-pay

## 2019-04-18 DIAGNOSIS — F4323 Adjustment disorder with mixed anxiety and depressed mood: Secondary | ICD-10-CM | POA: Diagnosis not present
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
## 2019-05-02 ENCOUNTER — Ambulatory Visit (INDEPENDENT_AMBULATORY_CARE_PROVIDER_SITE_OTHER): Payer: BC Managed Care – PPO | Admitting: Mental Health

## 2019-05-02 ENCOUNTER — Other Ambulatory Visit: Payer: Self-pay

## 2019-05-02 ENCOUNTER — Encounter: Payer: Self-pay | Admitting: Family Medicine

## 2019-05-02 ENCOUNTER — Ambulatory Visit (INDEPENDENT_AMBULATORY_CARE_PROVIDER_SITE_OTHER): Payer: BC Managed Care – PPO | Admitting: Family Medicine

## 2019-05-02 VITALS — BP 130/85 | HR 83 | Temp 97.1°F | Ht 68.0 in | Wt 264.8 lb

## 2019-05-02 DIAGNOSIS — G4733 Obstructive sleep apnea (adult) (pediatric): Secondary | ICD-10-CM

## 2019-05-02 DIAGNOSIS — F4323 Adjustment disorder with mixed anxiety and depressed mood: Secondary | ICD-10-CM

## 2019-05-02 NOTE — Patient Instructions (Addendum)
Continue working on weight loss. We will see you back in 6 months. If you have > 30 pounds weight loss we can reorder sleep test to reevaluate.   CPAP is the gold standard in OSA treatment    CPAP and BPAP Information CPAP and BPAP are methods of helping a person breathe with the use of air pressure. CPAP stands for "continuous positive airway pressure." BPAP stands for "bi-level positive airway pressure." In both methods, air is blown through your nose or mouth and into your air passages to help you breathe well. CPAP and BPAP use different amounts of pressure to blow air. With CPAP, the amount of pressure stays the same while you breathe in and out. With BPAP, the amount of pressure is increased when you breathe in (inhale) so that you can take larger breaths. Your health care provider will recommend whether CPAP or BPAP would be more helpful for you. Why are CPAP and BPAP treatments used? CPAP or BPAP can be helpful if you have:  Sleep apnea.  Chronic obstructive pulmonary disease (COPD).  Heart failure.  Medical conditions that weaken the muscles of the chest including muscular dystrophy, or neurological diseases such as amyotrophic lateral sclerosis (ALS).  Other problems that cause breathing to be weak, abnormal, or difficult. CPAP is most commonly used for obstructive sleep apnea (OSA) to keep the airways from collapsing when the muscles relax during sleep. How is CPAP or BPAP administered? Both CPAP and BPAP are provided by a small machine with a flexible plastic tube that attaches to a plastic mask. You wear the mask. Air is blown through the mask into your nose or mouth. The amount of pressure that is used to blow the air can be adjusted on the machine. Your health care provider will determine the pressure setting that should be used based on your individual needs. When should CPAP or BPAP be used? In most cases, the mask only needs to be worn during sleep. Generally, the mask  needs to be worn throughout the night and during any daytime naps. People with certain medical conditions may also need to wear the mask at other times when they are awake. Follow instructions from your health care provider about when to use the machine. What are some tips for using the mask?   Because the mask needs to be snug, some people feel trapped or closed-in (claustrophobic) when first using the mask. If you feel this way, you may need to get used to the mask. One way to do this is by holding the mask loosely over your nose or mouth and then gradually applying the mask more snugly. You can also gradually increase the amount of time that you use the mask.  Masks are available in various types and sizes. Some fit over your mouth and nose while others fit over just your nose. If your mask does not fit well, talk with your health care provider about getting a different one.  If you are using a mask that fits over your nose and you tend to breathe through your mouth, a chin strap may be applied to help keep your mouth closed.  The CPAP and BPAP machines have alarms that may sound if the mask comes off or develops a leak.  If you have trouble with the mask, it is very important that you talk with your health care provider about finding a way to make the mask easier to tolerate. Do not stop using the mask. Stopping the use  of the mask could have a negative impact on your health. What are some tips for using the machine?  Place your CPAP or BPAP machine on a secure table or stand near an electrical outlet.  Know where the on/off switch is located on the machine.  Follow instructions from your health care provider about how to set the pressure on your machine and when you should use it.  Do not eat or drink while the CPAP or BPAP machine is on. Food or fluids could get pushed into your lungs by the pressure of the CPAP or BPAP.  Do not smoke. Tobacco smoke residue can damage the machine.  For  home use, CPAP and BPAP machines can be rented or purchased through home health care companies. Many different brands of machines are available. Renting a machine before purchasing may help you find out which particular machine works well for you.  Keep the CPAP or BPAP machine and attachments clean. Ask your health care provider for specific instructions. Get help right away if:  You have redness or open areas around your nose or mouth where the mask fits.  You have trouble using the CPAP or BPAP machine.  You cannot tolerate wearing the CPAP or BPAP mask.  You have pain, discomfort, and bloating in your abdomen. Summary  CPAP and BPAP are methods of helping a person breathe with the use of air pressure.  Both CPAP and BPAP are provided by a small machine with a flexible plastic tube that attaches to a plastic mask.  If you have trouble with the mask, it is very important that you talk with your health care provider about finding a way to make the mask easier to tolerate. This information is not intended to replace advice given to you by your health care provider. Make sure you discuss any questions you have with your health care provider. Document Released: 01/29/2004 Document Revised: 08/22/2018 Document Reviewed: 03/21/2016 Elsevier Patient Education  2020 Elsevier Inc.    Sleep Apnea Sleep apnea affects breathing during sleep. It causes breathing to stop for a short time or to become shallow. It can also increase the risk of:  Heart attack.  Stroke.  Being very overweight (obese).  Diabetes.  Heart failure.  Irregular heartbeat. The goal of treatment is to help you breathe normally again. What are the causes? There are three kinds of sleep apnea:  Obstructive sleep apnea. This is caused by a blocked or collapsed airway.  Central sleep apnea. This happens when the brain does not send the right signals to the muscles that control breathing.  Mixed sleep apnea. This  is a combination of obstructive and central sleep apnea. The most common cause of this condition is a collapsed or blocked airway. This can happen if:  Your throat muscles are too relaxed.  Your tongue and tonsils are too large.  You are overweight.  Your airway is too small. What increases the risk?  Being overweight.  Smoking.  Having a small airway.  Being older.  Being male.  Drinking alcohol.  Taking medicines to calm yourself (sedatives or tranquilizers).  Having family members with the condition. What are the signs or symptoms?  Trouble staying asleep.  Being sleepy or tired during the day.  Getting angry a lot.  Loud snoring.  Headaches in the morning.  Not being able to focus your mind (concentrate).  Forgetting things.  Less interest in sex.  Mood swings.  Personality changes.  Feelings of sadness (depression).  Waking up a lot during the night to pee (urinate).  Dry mouth.  Sore throat. How is this diagnosed?  Your medical history.  A physical exam.  A test that is done when you are sleeping (sleep study). The test is most often done in a sleep lab but may also be done at home. How is this treated?   Sleeping on your side.  Using a medicine to get rid of mucus in your nose (decongestant).  Avoiding the use of alcohol, medicines to help you relax, or certain pain medicines (narcotics).  Losing weight, if needed.  Changing your diet.  Not smoking.  Using a machine to open your airway while you sleep, such as: ? An oral appliance. This is a mouthpiece that shifts your lower jaw forward. ? A CPAP device. This device blows air through a mask when you breathe out (exhale). ? An EPAP device. This has valves that you put in each nostril. ? A BPAP device. This device blows air through a mask when you breathe in (inhale) and breathe out.  Having surgery if other treatments do not work. It is important to get treatment for sleep  apnea. Without treatment, it can lead to:  High blood pressure.  Coronary artery disease.  In men, not being able to have an erection (impotence).  Reduced thinking ability. Follow these instructions at home: Lifestyle  Make changes that your doctor recommends.  Eat a healthy diet.  Lose weight if needed.  Avoid alcohol, medicines to help you relax, and some pain medicines.  Do not use any products that contain nicotine or tobacco, such as cigarettes, e-cigarettes, and chewing tobacco. If you need help quitting, ask your doctor. General instructions  Take over-the-counter and prescription medicines only as told by your doctor.  If you were given a machine to use while you sleep, use it only as told by your doctor.  If you are having surgery, make sure to tell your doctor you have sleep apnea. You may need to bring your device with you.  Keep all follow-up visits as told by your doctor. This is important. Contact a doctor if:  The machine that you were given to use during sleep bothers you or does not seem to be working.  You do not get better.  You get worse. Get help right away if:  Your chest hurts.  You have trouble breathing in enough air.  You have an uncomfortable feeling in your back, arms, or stomach.  You have trouble talking.  One side of your body feels weak.  A part of your face is hanging down. These symptoms may be an emergency. Do not wait to see if the symptoms will go away. Get medical help right away. Call your local emergency services (911 in the [place] Do not drive yourself to the hospital. Summary  This condition affects breathing during sleep.  The most common cause is a collapsed or blocked airway.  The goal of treatment is to help you breathe normally while you sleep. This information is not intended to replace advice given to you by your health care provider. Make sure you discuss any questions you have with your health care  provider. Document Released: 02/09/2008 Document Revised: 02/16/2018 Document Reviewed: 12/26/2017 Elsevier Patient Education  2020 [employer]

## 2019-05-02 NOTE — Progress Notes (Addendum)
PATIENT: Christian FarrierMatthew A [name] DOB: 12/01/1975  REASON FOR VISIT: follow up HISTORY FROM: patient  Chief Complaint  Patient presents with  . Follow-up    Initial auto pap f/u. Alone. Rm 1. Patient mentioned that he took his cpap machine back in October due to having worse sleep.      HISTORY OF PRESENT ILLNESS: Today 05/02/19 Christian FarrierMatthew A [name] is a 43 y.o. male here today for follow up for OSA on CPAP.  Molli HazardMatthew reports that initially he did attempt to CPAP therapy.  He felt that it was very difficult to complete therapy for 4 hours.  He found that he slept better without therapy.  He has returned his CPAP machine to the DME company.  He does not wish to use CPAP.  He states that he has lost about 20 pounds since July 2020.  He is working with primary care and is now taking Ozempic for weight loss.  He has managed by headache clinic and feels that topiramate is helping significantly with headaches.  He has no longer snoring.  Compliance report dated 02/10/2019 through 03/11/2019 reveals that he used CPAP 7 of those 30 days for compliance of 23%.  He used CPAP greater than 4 hours all 7 days for compliance of 23%.  On average he used CPAP 4 hours and 30 minutes.  Residual AHI was 0.6 on 7 to 14 cm of water and an EPR of 3.  There was no significant leak noted.  HISTORY: (copied from Dr Teofilo PodAthar's note on 01/23/2019)  Dear Dr. Clelia CroftShaw,   I saw your patient, Christian PlaterMatthew [name], upon your kind request in my sleep clinic today for initial consultation of his sleep disorder, in particular, concern for underlying obstructive sleep apnea.  The patient is unaccompanied today.  As you know, Mr. Christian [name] is a 43 year old right-handed gentleman with an underlying medical history of hypertension, impaired fasting glucose, migraine headaches, anxiety, and morbid obesity with a BMI of over 40, who reports snoring and excessive daytime somnolence.  I reviewed your office record from 01/16/2019 when he saw Tommy Medalatherine Miller,  Pharm.D.  His Epworth sleepiness score is 18 out of 24, fatigue severity score is 41 out of 63.  He had a sleep study several years ago but is never been on CPAP therapy.  Since the COVID-19 pandemic he has had a more erratic sleep schedule, generally, he tries to be in bed between 10 PM and 11:30 PM, rise time is currently around 6 or 6:15 AM.  He has woken himself up with a sense of gasping for air but more so with sharp frontal headaches which are short-lived but do wake him up in the mornings.  He has a family history of sleep apnea in his mother who has a CPAP machine.  He has discomfort at night especially in his mid and lower back, he has been seeing Dr. Ethelene Halamos and has received injections into the back since 2019 which have been helpful, more so after the first time.  He is a non-smoker, he drinks alcohol rarely, caffeine daily in the form of soda, 2-3 diet soda cans per day on average.  He would be willing to consider a CPAP machine but would rather use an oral appliance.  He has gained weight.  Since his sleep study several years ago he estimates that he is about 40 pounds heavier.  He works as a [employer].  He has 3 children, ages 41 year old son, 41 year old daughter and 41-year-old son.  His weight has been fluctuating, up to 20 pounds up or down.  Since the COVID-19 pandemic he has gained quite a bit of weight he reports.  He denies any telltale symptoms of restless leg syndrome.  He does not have night to night nocturia.    REVIEW OF SYSTEMS: Out of a complete 14 system review of symptoms, the patient complains only of the following symptoms, headaches and all other reviewed systems are negative.  ALLERGIES: No Known Allergies  HOME MEDICATIONS: Outpatient Medications Prior to Visit  Medication Sig Dispense Refill  . baclofen (LIORESAL) 10 MG tablet Take 10 mg by mouth 3 (three) times daily as needed.    Marland Kitchen lisinopril-hydrochlorothiazide (ZESTORETIC) 10-12.5 MG tablet Take 1 tablet by  mouth daily.    . predniSONE (STERAPRED UNI-PAK 21 TAB) 10 MG (21) TBPK tablet     . rizatriptan (MAXALT) 10 MG tablet Take 10 mg by mouth as needed.    . rosuvastatin (CRESTOR) 10 MG tablet Take 10 mg by mouth at bedtime.    . Semaglutide (OZEMPIC, 1 MG/DOSE, [place]) Inject 0.75 mg into the skin once a week.     . topiramate (TOPAMAX) 50 MG tablet Take 150 mg by mouth at bedtime.     Marland Kitchen PRESCRIPTION MEDICATION Take 1 tablet by mouth daily. Blood pressure medication     No facility-administered medications prior to visit.    PAST MEDICAL HISTORY: Past Medical History:  Diagnosis Date  . Diabetes ([place])   . Hypertension   . Meningitis   . Metabolic syndrome   . Migraine headache   . Obesity   . Snoring     PAST SURGICAL HISTORY: Past Surgical History:  Procedure Laterality Date  . ABDOMINAL SURGERY    . APPENDECTOMY    . UMBILICAL HERNIA REPAIR      FAMILY HISTORY: Family History  Problem Relation Age of Onset  . Fibromyalgia Mother   . Neurodegenerative disease Mother   . CVA Father   . CAD Father   . Diabetes Father   . Hypertension Father   . Obesity Father   . Liver cancer Maternal Grandfather   . Heart attack Maternal Grandfather   . Lung disease Paternal Grandmother   . Heart attack Paternal Grandfather   . Diabetes Paternal Grandfather     SOCIAL HISTORY: Social History   Socioeconomic History  . Marital status: Married    Spouse name: Not on file  . Number of children: Not on file  . Years of education: Not on file  . Highest education level: Not on file  Occupational History  . Not on file  Tobacco Use  . Smoking status: Never Smoker  . Smokeless tobacco: Never Used  Substance and Sexual Activity  . Alcohol use: Yes    Comment: Occassional Beer   . Drug use: No  . Sexual activity: Not on file  Other Topics Concern  . Not on file  Social History Narrative  . Not on file   Social Determinants of Health   Financial Resource Strain:   .  Difficulty of Paying Living Expenses: Not on file  Food Insecurity:   . Worried About Charity fundraiser in the Last Year: Not on file  . Ran Out of Food in the Last Year: Not on file  Transportation Needs:   . Lack of Transportation (Medical): Not on file  . Lack of Transportation (Non-Medical): Not on file  Physical Activity:   . Days of Exercise per Week:  Not on file  . Minutes of Exercise per Session: Not on file  Stress:   . Feeling of Stress : Not on file  Social Connections:   . Frequency of Communication with Friends and Family: Not on file  . Frequency of Social Gatherings with Friends and Family: Not on file  . Attends Religious Services: Not on file  . Active Member of Clubs or Organizations: Not on file  . Attends Banker Meetings: Not on file  . Marital Status: Not on file  Intimate Partner Violence:   . Fear of Current or Ex-Partner: Not on file  . Emotionally Abused: Not on file  . Physically Abused: Not on file  . Sexually Abused: Not on file      PHYSICAL EXAM  Vitals:   05/02/19 0849  BP: 130/85  Pulse: 83  Temp: (!) 97.1 F (36.2 C)  TempSrc: Oral  Weight: 264 lb 12.8 oz (120.1 kg)  Height:  (1.727 m)   Body mass index is 40.26 kg/m.  Generalized: Well developed, in no acute distress  Cardiology: normal rate and rhythm, no murmur noted Respiratory: Clear to auscultation bilaterally Neurological examination  Mentation: Alert oriented to time, place, history taking. Follows all commands speech and language fluent Cranial nerve II-XII: Pupils were equal round reactive to light. Extraocular movements were full, visual field were full on confrontational test. Motor: The motor testing reveals 5 over 5 strength of all 4 extremities. Good symmetric motor tone is noted throughout.  Gait and station: Gait is normal.   DIAGNOSTIC DATA (LABS, IMAGING, TESTING) - I reviewed patient records, labs, notes, testing and imaging myself where  available.  No flowsheet data found.   Lab Results  Component Value Date   WBC 7.0 04/19/2014   HGB 15.8 04/19/2014   HCT 46.9 04/19/2014   MCV 87.2 04/19/2014   PLT 152 04/19/2014      Component Value Date/Time   NA 141 04/19/2014 1152   K 4.1 04/19/2014 1152   CL 102 04/19/2014 1152   CO2 25 04/19/2014 1152   GLUCOSE 96 04/19/2014 1152   BUN 11 04/19/2014 1152   CREATININE 0.95 04/19/2014 1152   CALCIUM 9.4 04/19/2014 1152   PROT 8.0 04/19/2014 1152   ALBUMIN 4.1 04/19/2014 1152   AST 18 04/19/2014 1152   ALT 23 04/19/2014 1152   ALKPHOS 92 04/19/2014 1152   BILITOT 0.3 04/19/2014 1152   GFRNONAA >90 04/19/2014 1152   GFRAA >90 04/19/2014 1152   No results found for: CHOL, HDL, LDLCALC, LDLDIRECT, TRIG, CHOLHDL No results found for: RUEA5W No results found for: VITAMINB12 No results found for: TSH     ASSESSMENT AND PLAN 43 y.o. year old male  has a past medical history of Diabetes (HCC), Hypertension, Meningitis, Metabolic syndrome, Migraine headache, Obesity, and Snoring. here with     ICD-10-CM   1. OSA (obstructive sleep apnea)  G47.33     Raghav reports that he is unable to tolerate CPAP therapy.  He has returned to his CPAP machine.  We have discussed in detail the results from his home sleep test.  I have educated him on the diagnosis of moderate to severe obstructive sleep apnea.  We have discussed concerns of hypoxemia with oxygen desaturations to 79%.  I have discussed risks of untreated sleep apnea.  Aarron wishes to continue working on weight loss efforts.  He feels certain that with continued weight loss apnea will improve.  He states that he no  longer has morning headaches.  He has no longer snoring.  I have advised that he return in 6 months for follow-up.  We will reevaluate at that time.  I would like to obtain a nocturnal polysomnography in office if insurance will approve.  He verbalizes understanding and agreement with this plan.   No orders of  the defined types were placed in this encounter.    No orders of the defined types were placed in this encounter.     I spent 15 minutes with the patient. 50% of this time was spent counseling and educating patient on plan of care and medications.    Shawnie Dapper, FNP-C 05/02/2019, 12:34 PM [facility] [address], [address] [place], [place] [address] [phone]  I reviewed the above note and documentation by the Nurse Practitioner and agree with the history, exam, assessment and plan as outlined above. I was available for consultation. Huston Foley, MD, PhD [facility])

## 2019-05-02 NOTE — Progress Notes (Signed)
[facility] Counsel[facility] Psychotherapy Note  Name: Christian [name] Date: 05/02/2019 MRN: [id number] DOB: 1976-03-20 PCP: Marton Redwood, MD  Time spent: 53 mins  Treatment: Individual Therapy  Mental Status Exam:   Appearance:   Casual     Behavior:  Appropriate  Motor:  Normal  Speech/Language:   Clear and Coherent  Affect:  Congruent  Mood:  depressed  Thought process:  normal  Thought content:    WNL  Sensory/Perceptual disturbances:    WNL  Orientation:  x4  Attention:  Good  Concentration:  Good  Memory:  intact  Fund of knowledge:   Good  Insight:    Good  Judgment:   Good  Impulse Control:  Good   Reported Symptoms:  Irritable, depressed, anxiety, anger outbursts (verbal)  Risk Assessment: Danger to Self:  No Self-injurious Behavior: No Danger to Others: No Duty to Warn:no Physical Aggression / Violence:No  Access to Firearms a concern: No  Gang Involvement:No  Patient / guardian was educated about steps to take if suicide or homicide risk level increases between visits: yes While future psychiatric events cannot be accurately predicted, the patient does not currently require acute inpatient psychiatric care and does not currently meet [facility] involuntary commitment criteria.  Subjective:  Patient arrived on time for today's session. He stated his wife has medical issues, but will not disclose her medical hx and what medications she takes.  Stated she has a hx of excessive spending which has put them in debt at times.  He stated they have considerable marital stress that has been ongoing but recently increased with her marital separation was discussed.  Provide support as patient processed feelings about the same. He provided various examples of how his daughter has significant behavioral issues and how his wife responds to her and effectively often leading to an escalated situation between them.  Patient went on to discuss more relevant history related to his  marital relationship, continued family relational stressors with a focus on his wife and daughter and has attempted to cope.  Provide support, understanding while encouraging patient to engage in some healthy outlets for himself to work to decrease stress levels.  Intervention: CBT, supportive therapy  Diagnoses:    ICD-10-CM   1. Adjustment disorder with mixed anxiety and depressed mood  F43.23      Plan: Patient is to use CBT, mindfulness and coping skills to help manage decrease symptoms associated with their diagnosis.  Patient to continue to work on his communication and assess his expectations and how they play a role in his reactive behaviors that include anger outbursts.   Long-term goal:   Reduce overall level, frequency, and intensity of the feelings of depression, anxiety and panic evidenced by decreased irritability, negative self talk  from 6 to 7 days/week to 0 to 1 days/week per client report for at least 3 consecutive months.  Short-term goal:  Verbally express understanding of the relationship between feelings of depression, anxiety and their impact on thinking patterns and behaviors. Verbalize an understanding of the role that distorted thinking plays in creating fears, excessive worry, and ruminations. Improve effective communication skills to reduce relational stress Decrease irritability frequency and anger outbursts  Progress: Progressing  [place], [facility]

## 2019-05-15 DIAGNOSIS — E785 Hyperlipidemia, unspecified: Secondary | ICD-10-CM | POA: Diagnosis not present

## 2019-05-15 DIAGNOSIS — I10 Essential (primary) hypertension: Secondary | ICD-10-CM | POA: Diagnosis not present

## 2019-05-15 DIAGNOSIS — R7301 Impaired fasting glucose: Secondary | ICD-10-CM | POA: Diagnosis not present

## 2019-05-16 ENCOUNTER — Ambulatory Visit: Payer: BC Managed Care – PPO | Admitting: Mental Health

## 2019-05-30 ENCOUNTER — Ambulatory Visit (INDEPENDENT_AMBULATORY_CARE_PROVIDER_SITE_OTHER): Payer: BC Managed Care – PPO | Admitting: Mental Health

## 2019-05-30 DIAGNOSIS — F4323 Adjustment disorder with mixed anxiety and depressed mood: Secondary | ICD-10-CM

## 2019-05-30 NOTE — Progress Notes (Signed)
[facility] Counsel[facility] Psychotherapy Note  Name: Christian [name] Date: 05/30/2019 MRN: [id number] DOB: [birth year] PCP: Marton Redwood, MD  Time spent: 55 minutes  Treatment: Individual Therapy  Virtual Visit via Telephone Note Connected with patient by a video enabled telemedicine/telehealth application or telephone, with their informed consent, and verified patient privacy and that I am speaking with the correct person using two identifiers. I discussed the limitations, risks, security and privacy concerns of performing psychotherapy and management service by telephone and the availability of in person appointments. I also discussed with the patient that there may be a patient responsible charge related to this service. The patient expressed understanding and agreed to proceed. I discussed the treatment planning with the patient. The patient was provided an opportunity to ask questions and all were answered. The patient agreed with the plan and demonstrated an understanding of the instructions. The patient was advised to call  our office if  symptoms worsen or feel they are in a crisis state and need immediate contact.   Therapist Location: home Patient Location: home  Mental Status Exam:   Appearance:   Casual     Behavior:  Appropriate  Motor:  Normal  Speech/Language:   Clear and Coherent  Affect:  Congruent  Mood:  depressed  Thought process:  normal  Thought content:    WNL  Sensory/Perceptual disturbances:    WNL  Orientation:  x4  Attention:  Good  Concentration:  Good  Memory:  intact  Fund of knowledge:   Good  Insight:    Good  Judgment:   Good  Impulse Control:  Good   Reported Symptoms:  Irritable, depressed, anxiety, anger outbursts (verbal)  Risk Assessment: Danger to Self:  No Self-injurious Behavior: No Danger to Others: No Duty to Warn:no Physical Aggression / Violence:No  Access to Firearms a concern: No  Gang Involvement:No  Patient / guardian was  educated about steps to take if suicide or homicide risk level increases between visits: yes While future psychiatric events cannot be accurately predicted, the patient does not currently require acute inpatient psychiatric care and does not currently meet [facility] involuntary commitment criteria.  Subjective:  Patient engaged in telehealth session. Stated his wife has been sick lately. Over the last week, has been "all over the place" referring to her mood. He stated they have been supportive to a male friend who is going through marital stress. Patient stated his wife was taking this man some food recently.  A few days later, he noticed their sex life had increased. They discussed reasons. Wife stated she wanted their marriage to work. She then asked if it was okay to bring the other man into their sexual relationship. Patient stated he was upset, hurt. This strained the relationship.  He stated his wife is constantly upset w/ their 42 year old due to his having a gf and spending most of his time w/ her. He stated his wife blames the lawsuit on their 65 year old and him due to how it has increased her emotional distress. He stated his daughter has had continued behavioral issues. They are concerned about their younger son due to having less friends as a result of the lawsuit as they had to pull him from the school last year. Wife continues to take medication for mood but he does not know what she takes, she continues not disclose what she takes to her husband. He was able to confront her re; her mood. Encouraged him to attempt to  find time for himself to destress due to multiple stressors.   Intervention: CBT, supportive therapy  Diagnoses:    ICD-10-CM   1. Adjustment disorder with mixed anxiety and depressed mood  F43.23      Plan: Patient is to use CBT, mindfulness and coping skills to help manage decrease symptoms associated with their diagnosis.  Patient to continue to work on his  communication and assess his expectations and how they play a role in his reactive behaviors that include anger outbursts.   Long-term goal:   Reduce overall level, frequency, and intensity of the feelings of depression, anxiety and panic evidenced by decreased irritability, negative self talk  from 6 to 7 days/week to 0 to 1 days/week per client report for at least 3 consecutive months.  Short-term goal:  Verbally express understanding of the relationship between feelings of depression, anxiety and their impact on thinking patterns and behaviors. Verbalize an understanding of the role that distorted thinking plays in creating fears, excessive worry, and ruminations. Improve effective communication skills to reduce relational stress Decrease irritability frequency and anger outbursts  Progress: Progressing  [name], [facility]

## 2019-06-13 ENCOUNTER — Ambulatory Visit (INDEPENDENT_AMBULATORY_CARE_PROVIDER_SITE_OTHER): Payer: BC Managed Care – PPO | Admitting: Mental Health

## 2019-06-13 ENCOUNTER — Other Ambulatory Visit: Payer: Self-pay

## 2019-06-13 DIAGNOSIS — F4323 Adjustment disorder with mixed anxiety and depressed mood: Secondary | ICD-10-CM

## 2019-06-13 NOTE — Progress Notes (Signed)
[facility] Counsel[facility] Psychotherapy Note  Name: Christian [name] Date: 06/13/2019 MRN: [id number] DOB: October 15, 1975 PCP: Martha Clan, MD  Time spent: 54 minutes  Treatment: Individual Therapy  Mental Status Exam:   Appearance:   Casual     Behavior:  Appropriate  Motor:  Normal  Speech/Language:   Clear and Coherent  Affect:  Congruent  Mood:  depressed  Thought process:  normal  Thought content:    WNL  Sensory/Perceptual disturbances:    WNL  Orientation:  x4  Attention:  Good  Concentration:  Good  Memory:  intact  Fund of knowledge:   Good  Insight:    Good  Judgment:   Good  Impulse Control:  Good   Reported Symptoms:  Irritable, depressed, anxiety, anger outbursts (verbal)  Risk Assessment: Danger to Self:  No Self-injurious Behavior: No Danger to Others: No Duty to Warn:no Physical Aggression / Violence:No  Access to Firearms a concern: No  Gang Involvement:No  Patient / guardian was educated about steps to take if suicide or homicide risk level increases between visits: yes While future psychiatric events cannot be accurately predicted, the patient does not currently require acute inpatient psychiatric care and does not currently meet [facility] involuntary commitment criteria.  Subjective:  Patient arrived on time for today's session in no apparent distress.  He stated that he has had ongoing stress at home, how his wife continues to have an affective reactions often with his daughter and shared a recent conflict his daughter and son had how her approach escalated the situation.  He shared how he intervened and was able to de-escalate the situation, going on to share details.  He shared how his children are in therapy and how they participated in family therapy and a recent session.  He shared how his wife has minimal accountability for her ineffective way that she tries to handle some of her children's behavior and also how she struggles with accountability and  other relationships as well as in their marital relationship.  He shared how he was able to spend time with a friend recently, how he needs downtime for himself.  Has been able to begin working out about 2 times per week and this is helpful but shared how he needs more time although this is a struggle to find.  We explored ways to try and obtain a way to integrate these efforts towards self-care consistently.  He plans to follow through between sessions.  Intervention: CBT, supportive therapy  Diagnoses:    ICD-10-CM   1. Adjustment disorder with mixed anxiety and depressed mood  F43.23      Plan: Patient is to use CBT, mindfulness and coping skills to help manage decrease symptoms associated with their diagnosis.  Patient to continue to work on his communication and assess his expectations and how they play a role in his reactive behaviors that include anger outbursts.   Long-term goal:   Reduce overall level, frequency, and intensity of the feelings of depression, anxiety and panic evidenced by decreased irritability, negative self talk  from 6 to 7 days/week to 0 to 1 days/week per client report for at least 3 consecutive months.  Short-term goal:  Verbally express understanding of the relationship between feelings of depression, anxiety and their impact on thinking patterns and behaviors. Verbalize an understanding of the role that distorted thinking plays in creating fears, excessive worry, and ruminations. Improve effective communication skills to reduce relational stress Decrease irritability frequency and anger outbursts  Progress:  [place], [facility]

## 2019-06-27 ENCOUNTER — Other Ambulatory Visit: Payer: Self-pay

## 2019-06-27 ENCOUNTER — Ambulatory Visit (INDEPENDENT_AMBULATORY_CARE_PROVIDER_SITE_OTHER): Payer: BC Managed Care – PPO | Admitting: Mental Health

## 2019-06-27 DIAGNOSIS — F4323 Adjustment disorder with mixed anxiety and depressed mood: Secondary | ICD-10-CM

## 2019-06-27 NOTE — Progress Notes (Signed)
[facility] Psychotherapy Note  Name: Christian [name] Date: 06/27/2019 MRN: [id number] DOB: [birth year] PCP: Martha Clan, MD  Time spent: 54 minutes  Treatment: Individual Therapy  Mental Status Exam:   Appearance:   Casual     Behavior:  Appropriate  Motor:  Normal  Speech/Language:   Clear and Coherent  Affect:  Congruent  Mood:  depressed  Thought process:  normal  Thought content:    WNL  Sensory/Perceptual disturbances:    WNL  Orientation:  x4  Attention:  Good  Concentration:  Good  Memory:  intact  Fund of knowledge:   Good  Insight:    Good  Judgment:   Good  Impulse Control:  Good   Reported Symptoms:  Irritable, depressed, anxiety, anger outbursts (verbal)  Risk Assessment: Danger to Self:  No Self-injurious Behavior: No Danger to Others: No Duty to Warn:no Physical Aggression / Violence:No  Access to Firearms a concern: No  Gang Involvement:No  Patient / guardian was educated about steps to take if suicide or homicide risk level increases between visits: yes While future psychiatric events cannot be accurately predicted, the patient does not currently require acute inpatient psychiatric care and does not currently meet [facility] involuntary commitment criteria.  Subjective:  Patient arrived on time for today's session.  He shared progress related to family stressors, relationships.  He stated that he and his wife continue to have marital stress going on to detail some recent issues between her and her daughter and with her older son.  He shared how recently she again stated that she wanted to separate and also that she recently talked to a lawyer.  He stated this is a change however about 2 days later she appears to back off on the statements.  Patient stated that he is at a point where he is emotionally exhausted from some of her behaviors which she feels instigate conflict in the home.  He shared how they different parenting styles, sharing  examples.  Patient stated he wants to be consistent with rules, expectations with the children, however his wife will not keep consistent with consequences and how he feels this consent mixed messages to the children.  He shared she continues to not share with him her mental health issues such as what medications she may take and this is been consistent for a while.  Patient identified to discovery his need to try and focus on some individual activities to de-stress and also needing to include some exercise in his week.  Provide support, encouragement continue to work with patient family cognitive behavioral framework.  Intervention: CBT, supportive therapy  Diagnoses:    ICD-10-CM   1. Adjustment disorder with mixed anxiety and depressed mood  F43.23      Plan: Patient is to use CBT, mindfulness and coping skills to help manage decrease symptoms associated with their diagnosis.  Patient to continue to work on his communication and assess his expectations and how they play a role in his reactive behaviors that include anger outbursts.   Long-term goal:   Reduce overall level, frequency, and intensity of the feelings of depression, anxiety and panic evidenced by decreased irritability, negative self talk  from 6 to 7 days/week to 0 to 1 days/week per client report for at least 3 consecutive months.  Short-term goal:  Verbally express understanding of the relationship between feelings of depression, anxiety and their impact on thinking patterns and behaviors. Verbalize an understanding of the role that distorted thinking plays  in creating fears, excessive worry, and ruminations. Improve effective communication skills to reduce relational stress Decrease irritability frequency and anger outbursts  Progress: Progressing  Anson [place], [facility]

## 2019-07-11 ENCOUNTER — Ambulatory Visit: Payer: BC Managed Care – PPO | Admitting: Mental Health

## 2019-07-18 DIAGNOSIS — Z6841 Body Mass Index (BMI) 40.0 and over, adult: Secondary | ICD-10-CM | POA: Diagnosis not present

## 2019-07-18 DIAGNOSIS — G43009 Migraine without aura, not intractable, without status migrainosus: Secondary | ICD-10-CM | POA: Diagnosis not present

## 2019-07-18 DIAGNOSIS — G43709 Chronic migraine without aura, not intractable, without status migrainosus: Secondary | ICD-10-CM | POA: Diagnosis not present

## 2019-07-24 ENCOUNTER — Other Ambulatory Visit: Payer: Self-pay

## 2019-07-24 ENCOUNTER — Ambulatory Visit (INDEPENDENT_AMBULATORY_CARE_PROVIDER_SITE_OTHER): Payer: BC Managed Care – PPO | Admitting: Mental Health

## 2019-07-24 DIAGNOSIS — F4323 Adjustment disorder with mixed anxiety and depressed mood: Secondary | ICD-10-CM

## 2019-07-24 NOTE — Progress Notes (Signed)
[facility] Psychotherapy Note  Name: Christian [name] Date: 07/24/2019 MRN: [id number] DOB: 1976/04/10 PCP: Marton Redwood, MD  Time spent: 62 minutes  Treatment: Individual Therapy  Mental Status Exam:   Appearance:   Casual     Behavior:  Appropriate  Motor:  Normal  Speech/Language:   Clear and Coherent  Affect:  Congruent  Mood:  depressed  Thought process:  normal  Thought content:    WNL  Sensory/Perceptual disturbances:    WNL  Orientation:  x4  Attention:  Good  Concentration:  Good  Memory:  intact  Fund of knowledge:   Good  Insight:    Good  Judgment:   Good  Impulse Control:  Good   Reported Symptoms:  Irritable, depressed, anxiety, anger outbursts (verbal)  Risk Assessment: Danger to Self:  No Self-injurious Behavior: No Danger to Others: No Duty to Warn:no Physical Aggression / Violence:No  Access to Firearms a concern: No  Gang Involvement:No  Patient / guardian was educated about steps to take if suicide or homicide risk level increases between visits: yes While future psychiatric events cannot be accurately predicted, the patient does not currently require acute inpatient psychiatric care and does not currently meet [facility] involuntary commitment criteria.  Subjective:  Patient arrived on time for today's session.  He shared the ongoing stress at home, how his daughter was evaluated at the ER about a week ago due to her continued aggressive behaviors at home towards his wife.  He stated that she was not admitted inpatient but they were advised by the assessment team to bring her back if she exhibits any similar behaviors regarding concerns of safety.  He vented feelings related to the challenges of raising his daughter who copes with autism, they are looking into alternative treatment options as she is in outpatient therapy, however he is concerned about financial stress as the consideration of boarding school was discussed.  He will initiate  how they continue to navigate steps regarding the lawsuit with their sons previous school.  He is hopeful that in the coming months it will be concluded.  He shared how he and his wife have had some continued arguments, her recently stating that she wanted a divorce contacting a Chief Executive Officer.  He shared at this point, he is unsure of the future of the relationship, sharing some examples of how they argue frequently, how she has a significant temper but also that she is engaged in treatment, and how he hopes this will be helpful.  Some ways to communicate effectively were explored during session as well as considering the possibility of their engaging in couples counseling.   Intervention: CBT, supportive therapy  Diagnoses:    ICD-10-CM   1. Adjustment disorder with mixed anxiety and depressed mood  F43.23      Plan: Patient is to use CBT, mindfulness and coping skills to help manage decrease symptoms associated with their diagnosis.  Patient to continue to work on his communication and assess his expectations and how they play a role in his reactive behaviors that include anger outbursts.  Patient to utilize communication skills with more focus on affirming statements towards his wife.   Long-term goal:    Reduce overall level, frequency, and intensity of the feelings of depression, anxiety and panic evidenced by decreased irritability, negative self talk  from 6 to 7 days/week to 0 to 1 days/week per client report for at least 3 consecutive months.  Short-term goal:  Verbally express understanding of the  relationship between feelings of depression, anxiety and their impact on thinking patterns and behaviors. Verbalize an understanding of the role that distorted thinking plays in creating fears, excessive worry, and ruminations. Improve effective communication skills to reduce relational stress Decrease irritability frequency and anger outbursts  Progress: Progressing  [name] Session, [facility]

## 2019-07-25 DIAGNOSIS — M79641 Pain in right hand: Secondary | ICD-10-CM | POA: Diagnosis not present

## 2019-08-07 ENCOUNTER — Ambulatory Visit (INDEPENDENT_AMBULATORY_CARE_PROVIDER_SITE_OTHER): Payer: BC Managed Care – PPO | Admitting: Mental Health

## 2019-08-07 ENCOUNTER — Other Ambulatory Visit: Payer: Self-pay

## 2019-08-07 DIAGNOSIS — F4323 Adjustment disorder with mixed anxiety and depressed mood: Secondary | ICD-10-CM | POA: Diagnosis not present

## 2019-08-07 NOTE — Progress Notes (Signed)
[facility] Psychotherapy Note  Name: Christian [name] Date: 08/07/2019 MRN: [id number] DOB: November 02, 1975 PCP: Marton Redwood, MD  Time spent: 54 minutes  Treatment: Individual Therapy  Mental Status Exam:   Appearance:   Casual     Behavior:  Appropriate  Motor:  Normal  Speech/Language:   Clear and Coherent  Affect:  Congruent, full range  Mood:   Euthymic, anxious  Thought process:  normal  Thought content:    WNL  Sensory/Perceptual disturbances:    WNL  Orientation:  x4  Attention:  Good  Concentration:  Good  Memory:  intact  Fund of knowledge:   Good  Insight:    Good  Judgment:   Good  Impulse Control:  Good   Reported Symptoms:  Irritable, depressed, anxiety, anger outbursts (verbal)  Risk Assessment: Danger to Self:  No Self-injurious Behavior: No Danger to Others: No Duty to Warn:no Physical Aggression / Violence:No  Access to Firearms a concern: No  Gang Involvement:No  Patient / guardian was educated about steps to take if suicide or homicide risk level increases between visits: yes While future psychiatric events cannot be accurately predicted, the patient does not currently require acute inpatient psychiatric care and does not currently meet [facility] involuntary commitment criteria.  Subjective:  He shared recent progress, reports some continued marital stress. He stated she has mood changes, sharing some details.  He stated that his wife realize she was not taking her medication that helps to stabilize her mood over the past few months, how this may have played a role in some of her recent mood issues per his report.  He shared how his daughter is also exhibiting a more stable mood in part due to her medication change.  He stated his wife continues to vacillate between wanting to work through the differences to separating and ending the marriage.  He shared his attempts to communicate in the relationship and we continue to explore possibilities as  well as reviewing the potential benefits of they are going to couples counseling at some point.  He continues to be committed to the relationship and feels that he wants they conclude the current lawsuit that some of the stress will decrease and this will in turn improve family relationships in some ways but also his marital relationship.  He identified the negative cognition "nothing ever changes" referring to the challenges that he has identified in the marriage relating to his wife's mood as well as her being dissatisfied with his efforts or judgmental.  We discussed the potential benefits of her restarting some medication as he stated this may be helpful but ultimately at this point he is doubtful long-term.   Patient arrived on time for today's session.  He shared the ongoing stress at home, how his daughter was evaluated at the ER about a week ago due to her continued aggressive behaviors at home towards his wife.  He stated that she was not admitted inpatient but they were advised by the assessment team to bring her back if she exhibits any similar behaviors regarding concerns of safety.  He vented feelings related to the challenges of raising his daughter who copes with autism, they are looking into alternative treatment options as she is in outpatient therapy, however he is concerned about financial stress as the consideration of boarding school was discussed.  He will initiate how they continue to navigate steps regarding the lawsuit with their sons previous school.  He is hopeful that in the coming  months it will be concluded.  He shared how he and his wife have had some continued arguments, her recently stating that she wanted a divorce contacting a Clinical research associate.  He shared at this point, he is unsure of the future of the relationship, sharing some examples of how they argue frequently, how she has a significant temper but also that she is engaged in treatment, and how he hopes this will be helpful.  Some  ways to communicate effectively were explored during session as well as considering the possibility of their engaging in couples counseling.   Intervention: CBT, supportive therapy  Diagnoses:  No diagnosis found.   Plan: Patient is to use CBT, mindfulness and coping skills to help manage decrease symptoms associated with their diagnosis.  Patient to continue to work on his communication and assess his expectations and how they play a role in his reactive behaviors that include anger outbursts.  Patient to utilize communication skills with more focus on affirming statements towards his wife.   Long-term goal:    Reduce overall level, frequency, and intensity of the feelings of depression, anxiety and panic evidenced by decreased irritability, negative self talk  from 6 to 7 days/week to 0 to 1 days/week per client report for at least 3 consecutive months.  Short-term goal:  Verbally express understanding of the relationship between feelings of depression, anxiety and their impact on thinking patterns and behaviors. Verbalize an understanding of the role that distorted thinking plays in creating fears, excessive worry, and ruminations. Improve effective communication skills to reduce relational stress Decrease irritability frequency and anger outbursts  Progress: Progressing  [name] Session, [facility]

## 2019-08-21 ENCOUNTER — Ambulatory Visit (INDEPENDENT_AMBULATORY_CARE_PROVIDER_SITE_OTHER): Payer: BC Managed Care – PPO | Admitting: Mental Health

## 2019-08-21 ENCOUNTER — Other Ambulatory Visit: Payer: Self-pay

## 2019-08-21 DIAGNOSIS — F4323 Adjustment disorder with mixed anxiety and depressed mood: Secondary | ICD-10-CM | POA: Diagnosis not present

## 2019-08-21 NOTE — Progress Notes (Signed)
[facility] Psychotherapy Note  Name: Christian [name] Date: 08/21/2019 MRN: [id number] DOB: October 19, 1975 PCP: Martha Clan, MD  Time spent: 54 minutes  Treatment: Individual Therapy  Mental Status Exam:   Appearance:   Casual     Behavior:  Appropriate  Motor:  Normal  Speech/Language:   Clear and Coherent  Affect:  Congruent, full range  Mood:   Euthymic, anxious  Thought process:  normal  Thought content:    WNL  Sensory/Perceptual disturbances:    WNL  Orientation:  x4  Attention:  Good  Concentration:  Good  Memory:  intact  Fund of knowledge:   Good  Insight:    Good  Judgment:   Good  Impulse Control:  Good   Reported Symptoms:  Irritable, depressed, anxiety, anger outbursts (verbal)  Risk Assessment: Danger to Self:  No Self-injurious Behavior: No Danger to Others: No Duty to Warn:no Physical Aggression / Violence:No  Access to Firearms a concern: No  Gang Involvement:No  Patient / guardian was educated about steps to take if suicide or homicide risk level increases between visits: yes While future psychiatric events cannot be accurately predicted, the patient does not currently require acute inpatient psychiatric care and does not currently meet [facility] involuntary commitment criteria.  Subjective:  Patient presents for session on time, sharing progress.  He stated that he continues to have marital stress going on to share some details, how his wife was upset this morning some of this relating to their daughter's behavior as well as the ongoing litigation.  He stated that they have to engage in deposition tomorrow however, his wife being stressed with the situation, is trying to distance herself from the process as it has been stressful for her.  Patient shared how it also has been stressful for him and the family.  He vented feelings related to his marital relationship, feels that he is often blamed by his wife in the relationship, stating that he  "works too much" as well as other similar comments.  At this point, he shared that he does not know what he needs to do to make her happy but did identify ways he plans to set some boundaries when she becomes highly critical of him.  We reviewed the potential benefits of couples counseling in which he stated he will consider but wants to move past the current litigation process.   Intervention: CBT, supportive therapy  Diagnoses:    ICD-10-CM   1. Adjustment disorder with mixed anxiety and depressed mood  F43.23      Plan: Patient is to use CBT, mindfulness and coping skills to help manage decrease symptoms associated with their diagnosis.  Patient to continue to work on his communication and assess his expectations and how they play a role in his reactive behaviors that include anger outbursts.  Patient to utilize communication skills with more focus on affirming statements towards his wife.   Long-term goal:    Reduce overall level, frequency, and intensity of the feelings of depression, anxiety and panic evidenced by decreased irritability, negative self talk  from 6 to 7 days/week to 0 to 1 days/week per client report for at least 3 consecutive months.  Short-term goal:  Verbally express understanding of the relationship between feelings of depression, anxiety and their impact on thinking patterns and behaviors. Verbalize an understanding of the role that distorted thinking plays in creating fears, excessive worry, and ruminations. Improve effective communication skills to reduce relational stress Decrease irritability frequency and  anger outbursts  Progress: Progressing  Anson [place], [facility]

## 2019-09-04 ENCOUNTER — Other Ambulatory Visit: Payer: Self-pay

## 2019-09-04 ENCOUNTER — Ambulatory Visit (INDEPENDENT_AMBULATORY_CARE_PROVIDER_SITE_OTHER): Payer: BC Managed Care – PPO | Admitting: Mental Health

## 2019-09-04 DIAGNOSIS — F4323 Adjustment disorder with mixed anxiety and depressed mood: Secondary | ICD-10-CM

## 2019-09-04 NOTE — Progress Notes (Signed)
[facility] Psychotherapy Note  Name: Christian [name] Date: 09/04/2019 MRN: [id number] DOB: Mar 01, 1976 PCP: Martha Clan, MD  Time spent: 56 minutes  Treatment: Individual Therapy  Mental Status Exam:   Appearance:   Casual     Behavior:  Appropriate  Motor:  Normal  Speech/Language:   Clear and Coherent  Affect:  Congruent, full range  Mood:   Euthymic, anxious  Thought process:  normal  Thought content:    WNL  Sensory/Perceptual disturbances:    WNL  Orientation:  x4  Attention:  Good  Concentration:  Good  Memory:  intact  Fund of knowledge:   Good  Insight:    Good  Judgment:   Good  Impulse Control:  Good   Reported Symptoms:  Irritable, depressed, anxiety, anger outbursts (verbal)  Risk Assessment: Danger to Self:  No Self-injurious Behavior: No Danger to Others: No Duty to Warn:no Physical Aggression / Violence:No  Access to Firearms a concern: No  Gang Involvement:No  Patient / guardian was educated about steps to take if suicide or homicide risk level increases between visits: yes While future psychiatric events cannot be accurately predicted, the patient does not currently require acute inpatient psychiatric care and does not currently meet [facility] involuntary commitment criteria.  Subjective:  He arrived on time, in no apparent distress sharing recent events related to outcomes of a recent mediation meeting.  He stated that they decided to settle the case.  Time was spent to allow him to share details as well as vent feelings.  How it impacted his relationship with his wife was also shared.  There continues to be stress in the marriage he shared how she blamed him for how the litigation affected their marriage and family.  He shared how she has vacillated between blaming him and wanting the case to continue over the last year.  He says she mentioned divorced recently again and he provided some details about how her conversation went and  ultimately has stopped again.  He shared feelings of confusion as she appears to vacillate with this decision as well, he expressed not wanting a divorce that she is the one that brings this up.  He went on to share other relationships, how his son will go to his parents property to go fishing and more relational history he has with his parents and sister.  He has a strained relationship with his father to the point where they do not speak for the last couple of years.  He speaks to his mother about once per week.  It appears they are falling out and centered around finances, that his father wants him to pay him back for partially paying for some of his college.  Patient made this agreement at the young age of 56 while also coping with learning disabilities that made it challenging to be successful academically, although he was able to graduate with honors through hard work.  We plan to further discuss this relationship next session.  Striking a balance of enjoying activities for himself while also being there for his children recognizing their interests was discussed as this has been challenging due to how he was raised.   Intervention: CBT, supportive therapy  Diagnoses:    ICD-10-CM   1. Adjustment disorder with mixed anxiety and depressed mood  F43.23      Plan: Patient is to use CBT, mindfulness and coping skills to help manage decrease symptoms associated with their diagnosis.  Patient to continue to work  on his communication and assess his expectations and how they play a role in his reactive behaviors that include anger outbursts.  Patient to utilize communication skills with more focus on affirming statements towards his wife.   Long-term goal:    Reduce overall level, frequency, and intensity of the feelings of depression, anxiety and panic evidenced by decreased irritability, negative self talk  from 6 to 7 days/week to 0 to 1 days/week per client report for at least 3 consecutive  months.  Short-term goal:  Verbally express understanding of the relationship between feelings of depression, anxiety and their impact on thinking patterns and behaviors. Verbalize an understanding of the role that distorted thinking plays in creating fears, excessive worry, and ruminations. Improve effective communication skills to reduce relational stress Decrease irritability frequency and anger outbursts  Progress: Progressing  Anson [place], [facility]

## 2019-09-09 DIAGNOSIS — R7301 Impaired fasting glucose: Secondary | ICD-10-CM | POA: Diagnosis not present

## 2019-09-09 DIAGNOSIS — G43909 Migraine, unspecified, not intractable, without status migrainosus: Secondary | ICD-10-CM | POA: Diagnosis not present

## 2019-09-09 DIAGNOSIS — I10 Essential (primary) hypertension: Secondary | ICD-10-CM | POA: Diagnosis not present

## 2019-09-09 DIAGNOSIS — E7849 Other hyperlipidemia: Secondary | ICD-10-CM | POA: Diagnosis not present

## 2019-09-18 ENCOUNTER — Ambulatory Visit (INDEPENDENT_AMBULATORY_CARE_PROVIDER_SITE_OTHER): Payer: BC Managed Care – PPO | Admitting: Mental Health

## 2019-09-18 ENCOUNTER — Other Ambulatory Visit: Payer: Self-pay

## 2019-09-18 DIAGNOSIS — F4323 Adjustment disorder with mixed anxiety and depressed mood: Secondary | ICD-10-CM

## 2019-09-18 NOTE — Progress Notes (Signed)
[facility] Psychotherapy Note  Name: Christian [name] Date: 09/18/2019 MRN: [id number] DOB: [birth year] PCP: Marton Redwood, MD  Time spent: 55 minutes  Treatment: Individual Therapy  Mental Status Exam:   Appearance:   Casual     Behavior:  Appropriate  Motor:  Normal  Speech/Language:   Clear and Coherent  Affect:  Congruent, full range  Mood:   Euthymic   Thought process:  normal  Thought content:    WNL  Sensory/Perceptual disturbances:    WNL  Orientation:  x4  Attention:  Good  Concentration:  Good  Memory:  intact  Fund of knowledge:   Good  Insight:    Good  Judgment:   Good  Impulse Control:  Good   Reported Symptoms:  Irritable, depressed, anxiety, anger outbursts (verbal)  Risk Assessment: Danger to Self:  No Self-injurious Behavior: No Danger to Others: No Duty to Warn:no Physical Aggression / Violence:No  Access to Firearms a concern: No  Gang Involvement:No  Patient / guardian was educated about steps to take if suicide or homicide risk level increases between visits: yes While future psychiatric events cannot be accurately predicted, the patient does not currently require acute inpatient psychiatric care and does not currently meet [facility] involuntary commitment criteria.  Subjective:  Patient presents for session in no apparent distress.  He shared his main stressor since our last session which was centered around his daughters mental health issues and subsequent behavioral outbursts.  Has affected the family giving various examples where shared as well as feelings related.  He and his wife continue to have some marital strain but less conflicts are related to the lawsuit.  He stated they both are expressing how they just want this chapter of their lives to be over relating to this lawsuit as it has been stressful.  Patient shared how his mother-in-law is having considerable health issues, going on to share their relational history which is  strained due to various factors over the years.  He stated that she can often be critical and recently on the visit she made a critical weight comment about his son in which his wife was able to address with her directly.  He and his wife are able to discuss the issue and appear to be having less relational conflicts recently.  Stated he is trying to manage his frustrations particularly with his daughter and her increasingly defiant behaviors recently.  I plan to have her tested to further identify her clinical needs.  Ways to relieve stress and cope and care for himself or continually explored where he plans to follow through with going for walks which have been helpful.   Intervention: CBT, supportive therapy  Diagnoses:    ICD-10-CM   1. Adjustment disorder with mixed anxiety and depressed mood  F43.23      Plan: Patient is to use CBT, mindfulness and coping skills to help manage decrease symptoms associated with their diagnosis.  Patient to continue to work on his communication and assess his expectations and how they play a role in his reactive behaviors that include anger outbursts.  Patient to utilize communication skills with more focus on affirming statements towards his wife.   Long-term goal:    Reduce overall level, frequency, and intensity of the feelings of depression, anxiety and panic evidenced by decreased irritability, negative self talk  from 6 to 7 days/week to 0 to 1 days/week per client report for at least 3 consecutive months.  Short-term goal:  Verbally  express understanding of the relationship between feelings of depression, anxiety and their impact on thinking patterns and behaviors. Verbalize an understanding of the role that distorted thinking plays in creating fears, excessive worry, and ruminations. Improve effective communication skills to reduce relational stress Decrease irritability frequency and anger outbursts  Progress: Progressing  [name] Session,  [facility]

## 2019-10-02 ENCOUNTER — Other Ambulatory Visit: Payer: Self-pay

## 2019-10-02 ENCOUNTER — Ambulatory Visit (INDEPENDENT_AMBULATORY_CARE_PROVIDER_SITE_OTHER): Payer: BC Managed Care – PPO | Admitting: Mental Health

## 2019-10-02 DIAGNOSIS — F4323 Adjustment disorder with mixed anxiety and depressed mood: Secondary | ICD-10-CM

## 2019-10-02 NOTE — Progress Notes (Signed)
[facility] Psychotherapy Note  Name: Christian [name] Date: 10/02/2019 MRN: [id number] DOB: 07-21-1975 PCP: Marton Redwood, MD  Time spent: 53 minutes  Treatment: Individual Therapy  Mental Status Exam:   Appearance:   Casual     Behavior:  Appropriate  Motor:  Normal  Speech/Language:   Clear and Coherent  Affect:  Congruent, full range  Mood:   Euthymic   Thought process:  normal  Thought content:    WNL  Sensory/Perceptual disturbances:    WNL  Orientation:  x4  Attention:  Good  Concentration:  Good  Memory:  intact  Fund of knowledge:   Good  Insight:    Good  Judgment:   Good  Impulse Control:  Good   Reported Symptoms:  Irritable, depressed, anxiety, anger outbursts (verbal)  Risk Assessment: Danger to Self:  No Self-injurious Behavior: No Danger to Others: No Duty to Warn:no Physical Aggression / Violence:No  Access to Firearms a concern: No  Gang Involvement:No  Patient / guardian was educated about steps to take if suicide or homicide risk level increases between visits: yes While future psychiatric events cannot be accurately predicted, the patient does not currently require acute inpatient psychiatric care and does not currently meet [facility] involuntary commitment criteria.  Subjective:  Patient presents for session sharing recent progress.  He stated that he and his wife continue to have less stress in the relationship, improved communication recently.  He shared how they are concluding the final details of the lawsuit with which they were engaged for the past several months.  He stated that he and his son plan to attend a court hearing to make him executor and this will be the final step.  Patient expresses feelings of relief and contentment with this sense of closure.  He stated that he and his wife continue to have challenges with her daughter who patient believes is on the autistic spectrum, going on to share details regarding her social  interactions and behaviors.  He worries about her future, if she will be able to function well enough to be independent or she will need their support significantly, at this point, he is unsure but expresses significant worry and concern.  They plan to continue to address her needs to plan and care for her.  Working about 60 hours a week, he has limited time for self-care, arguments in the house occur and he identifies the need for downtime, to keep his stress lower.  He plans to return to work on site possibly as soon as next month identifying the need for better work life balance.  Collaboratively, we explored ways to cope, care for himself to identify some activities he can engage in where he feels some of his stress will get relieved.  He plans to continue to work to identify between sessions as this is challenging due to his busy schedule.  Continue to work with patient from a cognitive behavioral framework, assisting him in identifying when he verbalized some hopeless feelings.   Intervention: CBT, supportive therapy  Diagnoses:    ICD-10-CM   1. Adjustment disorder with mixed anxiety and depressed mood  F43.23      Plan: Patient is to use CBT, mindfulness and coping skills to help manage decrease symptoms associated with their diagnosis.  Patient to continue to work on his communication and assess his expectations and how they play a role in his reactive behaviors that include anger outbursts.  Patient to continue to explore ways he  can care for himself, finding time to engaging meaningful activities to destress.   Long-term goal:    Reduce overall level, frequency, and intensity of the feelings of depression, anxiety and panic evidenced by decreased irritability, negative self talk  from 6 to 7 days/week to 0 to 1 days/week per client report for at least 3 consecutive months.  Short-term goal:  Verbally express understanding of the relationship between feelings of depression, anxiety and  their impact on thinking patterns and behaviors. Verbalize an understanding of the role that distorted thinking plays in creating fears, excessive worry, and ruminations. Improve effective communication skills to reduce relational stress Decrease irritability frequency and anger outbursts  Progress: Progressing  [name] Session, [facility]

## 2019-10-16 ENCOUNTER — Ambulatory Visit (INDEPENDENT_AMBULATORY_CARE_PROVIDER_SITE_OTHER): Payer: BC Managed Care – PPO | Admitting: Mental Health

## 2019-10-16 ENCOUNTER — Other Ambulatory Visit: Payer: Self-pay

## 2019-10-16 DIAGNOSIS — F4323 Adjustment disorder with mixed anxiety and depressed mood: Secondary | ICD-10-CM

## 2019-10-16 NOTE — Progress Notes (Addendum)
[facility] Psychotherapy Note  Name: Christian [name] Date: 10/16/2019 MRN: [id number] DOB: [birth year] PCP: Marton Redwood, MD  Time spent: 55 minutes  Treatment: Individual Therapy  Mental Status Exam:   Appearance:   Casual     Behavior:  Appropriate  Motor:  Normal  Speech/Language:   Clear and Coherent, some pressured  Affect:  Tearful, congruent  Mood:  Depressed, anxious  Thought process:  normal  Thought content:    WNL  Sensory/Perceptual disturbances:    WNL  Orientation:  x4  Attention:  Good  Concentration:  Good  Memory:  intact  Fund of knowledge:   Good  Insight:    developing  Judgment:   Good  Impulse Control:  Good   Reported Symptoms:  Irritable, depressed, anxiety, anger outbursts (verbal), tearful, feelings of inadequacy  Risk Assessment: Danger to Self:  No Self-injurious Behavior: No Danger to Others: No Duty to Warn:no Physical Aggression / Violence:No  Access to Firearms a concern: No  Gang Involvement:No  Patient / guardian was educated about steps to take if suicide or homicide risk level increases between visits: yes While future psychiatric events cannot be accurately predicted, the patient does not currently require acute inpatient psychiatric care and does not currently meet [facility] involuntary commitment criteria.  Subjective:  Patient presents for session in some apparent distress.  He tearfully shared how he has had a very difficult and challenging last few days.  He shared how he copes with chronic back pain due to multiple vertebrae fracture occurring a few years ago.  He stated that he has a history of taking injections to manage this pain but stopped last year due to ineffectiveness.  He stated that the months moved on, and he did not address his back issues.  He stated that he ordered some months for his yard, attempted to shovel a substantial amount over a period of 2 to 3 hours ultimately having to stop due to severe  pain and discomfort.  He stated his wife was helpful, continuing the job.  He identified significant feelings of inadequacy and feeling "demeaning" is being unable to complete this physical task. He shared how he has had decreased appetite over the past 2 days and some nausea.  We encouraged him to follow-up with his doctor appointments to address his back pain.  It appears the nausea could be associated.  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  He identified feelings of fear, worry and sadness.  Through guided discovery, he identified how this relates to their living in the city several years ago and his working at a job in which she had highly critical supervisor there and they are coping with significant financial stress.  He stated this was ongoing for a few years.  Challenged him to examine and identify any differences he knows to be true about himself currently versus during that time.  Encouraged him to identify strengths with discussion related to core beliefs.  We plan to further discuss next session.  Intervention: CBT, supportive therapy  Diagnoses:    ICD-10-CM   1. Adjustment disorder with mixed anxiety and depressed mood  F43.23      Plan: Patient is to use CBT, mindfulness and coping skills to help manage decrease symptoms  associated with their diagnosis.  Patient to continue to work on his communication and assess his expectations and how they play a role in his reactive behaviors that include anger outbursts.  Patient to continue to explore ways he can care for himself, finding time to engaging meaningful activities to destress.   Long-term goal:    Reduce overall level, frequency, and intensity of the feelings of depression, anxiety and panic evidenced by  decreased irritability, negative self talk  from 6 to 7 days/week to 0 to 1 days/week per client report for at least 3 consecutive months.  Patient to follow through with getting enough rest and nutrition as this will assist in helping his mood. He is to also reach out to his wife for support.  Short-term goal:  Verbally express understanding of the relationship between feelings of depression, anxiety and their impact on thinking patterns and behaviors. Verbalize an understanding of the role that distorted thinking plays in creating fears, excessive worry, and ruminations. Improve effective communication skills to reduce relational stress Decrease irritability frequency and anger outbursts  Progress: Progressing  [name] Session, [facility]

## 2019-10-18 DIAGNOSIS — M546 Pain in thoracic spine: Secondary | ICD-10-CM | POA: Diagnosis not present

## 2019-10-18 DIAGNOSIS — G43009 Migraine without aura, not intractable, without status migrainosus: Secondary | ICD-10-CM | POA: Diagnosis not present

## 2019-10-18 DIAGNOSIS — Z6841 Body Mass Index (BMI) 40.0 and over, adult: Secondary | ICD-10-CM | POA: Diagnosis not present

## 2019-10-18 DIAGNOSIS — Z6835 Body mass index (BMI) 35.0-35.9, adult: Secondary | ICD-10-CM | POA: Diagnosis not present

## 2019-10-18 DIAGNOSIS — G43709 Chronic migraine without aura, not intractable, without status migrainosus: Secondary | ICD-10-CM | POA: Diagnosis not present

## 2019-10-21 DIAGNOSIS — M5134 Other intervertebral disc degeneration, thoracic region: Secondary | ICD-10-CM | POA: Diagnosis not present

## 2019-10-30 ENCOUNTER — Other Ambulatory Visit: Payer: Self-pay

## 2019-10-30 ENCOUNTER — Ambulatory Visit (INDEPENDENT_AMBULATORY_CARE_PROVIDER_SITE_OTHER): Payer: BC Managed Care – PPO | Admitting: Mental Health

## 2019-10-30 DIAGNOSIS — M5134 Other intervertebral disc degeneration, thoracic region: Secondary | ICD-10-CM | POA: Diagnosis not present

## 2019-10-30 DIAGNOSIS — F4323 Adjustment disorder with mixed anxiety and depressed mood: Secondary | ICD-10-CM

## 2019-10-30 NOTE — Progress Notes (Signed)
[facility] Psychotherapy Note  Name: Christian [name] Date: 10/30/2019 MRN: [id number] DOB: 24-Aug-1975 PCP: Marton Redwood, MD  Time spent: 55 minutes  Treatment: Individual Therapy  Mental Status Exam:   Appearance:   Casual     Behavior:  Appropriate  Motor:  Normal  Speech/Language:   Clear and Coherent, some pressured  Affect:  Tearful, congruent  Mood:  Depressed, anxious  Thought process:  normal  Thought content:    WNL  Sensory/Perceptual disturbances:    WNL  Orientation:  x4  Attention:  Good  Concentration:  Good  Memory:  intact  Fund of knowledge:   Good  Insight:    developing  Judgment:   Good  Impulse Control:  Good   Reported Symptoms:  Irritable, depressed, anxiety, anger outbursts (verbal), tearful, feelings of inadequacy  Risk Assessment: Danger to Self:  No Self-injurious Behavior: No Danger to Others: No Duty to Warn:no Physical Aggression / Violence:No  Access to Firearms a concern: No  Gang Involvement:No  Patient / guardian was educated about steps to take if suicide or homicide risk level increases between visits: yes While future psychiatric events cannot be accurately predicted, the patient does not currently require acute inpatient psychiatric care and does not currently meet [facility] involuntary commitment criteria.  Subjective:  Patient presents for session continuing to share recent stressors.  He stated that he and his wife learned recently that she had 2 lumps on her chest letter to find out that one of them had precancerous cells while the other was benign.  She is scheduled for surgery early next month.  He processed many feelings related and share how he openly tried to talk with his wife about his fears, thoughts about what his life would be like without her here.  He stated that she told him that he was just saying that because he would have to take care of the children without her.  He stated how this was an accurate, as  thoughts were more focused on just being here without her and not having her in his life.  He went on to share other worries related to his daughter who appears to cope with challenges on the autistic spectrum.  He stated that they have identified a camp that would be suited to her needs, skill building however, his daughter has been upset because he has not a "normal" camp.  He shared how he feels that he and his wife have not been his career and honest with her daughter about her potential limitations as she considers herself someone who will be able to drive and even go to college.  Intervention: CBT, supportive therapy  Diagnoses:    ICD-10-CM   1. Adjustment disorder with mixed anxiety and depressed mood  F43.23      Plan: Patient is to use CBT, mindfulness and coping skills to help manage decrease symptoms associated with their diagnosis.  Patient to continue to work on his communication and assess his expectations and how they play a role in his reactive behaviors that include anger outbursts.  Patient to continue to explore ways he can care for himself, finding time to engaging meaningful activities to destress.   Long-term goal:    Reduce overall level, frequency, and intensity of the feelings of depression, anxiety and panic evidenced by decreased irritability, negative self talk  from 6 to 7 days/week to 0 to 1 days/week per client report for at least 3 consecutive months.  Patient to follow  through with getting enough rest and nutrition as this will assist in helping his mood. He is to also reach out to his wife for support.  Short-term goal:  Verbally express understanding of the relationship between feelings of depression, anxiety and their impact on thinking patterns and behaviors. Verbalize an understanding of the role that distorted thinking plays in creating fears, excessive worry, and ruminations. Improve effective communication skills to reduce relational stress Decrease  irritability frequency and anger outbursts  Progress: Progressing  [name] Session, [facility]

## 2019-10-31 ENCOUNTER — Ambulatory Visit: Payer: Self-pay | Admitting: Family Medicine

## 2019-11-01 DIAGNOSIS — M5134 Other intervertebral disc degeneration, thoracic region: Secondary | ICD-10-CM | POA: Diagnosis not present

## 2019-11-04 DIAGNOSIS — F419 Anxiety disorder, unspecified: Secondary | ICD-10-CM | POA: Diagnosis not present

## 2019-11-04 DIAGNOSIS — F329 Major depressive disorder, single episode, unspecified: Secondary | ICD-10-CM | POA: Diagnosis not present

## 2019-11-04 DIAGNOSIS — G43909 Migraine, unspecified, not intractable, without status migrainosus: Secondary | ICD-10-CM | POA: Diagnosis not present

## 2019-11-13 ENCOUNTER — Other Ambulatory Visit: Payer: Self-pay

## 2019-11-13 ENCOUNTER — Ambulatory Visit (INDEPENDENT_AMBULATORY_CARE_PROVIDER_SITE_OTHER): Payer: BC Managed Care – PPO | Admitting: Mental Health

## 2019-11-13 DIAGNOSIS — F4323 Adjustment disorder with mixed anxiety and depressed mood: Secondary | ICD-10-CM | POA: Diagnosis not present

## 2019-11-13 DIAGNOSIS — M5134 Other intervertebral disc degeneration, thoracic region: Secondary | ICD-10-CM | POA: Diagnosis not present

## 2019-11-13 NOTE — Progress Notes (Signed)
[facility] Psychotherapy Note  Name: Christian Christian [name] Date: 11/13/2019 MRN: [id number] DOB: [birth year] PCP: Christian Clan, MD  Time spent: 54 minutes  Treatment: Individual Therapy  Mental Status Exam:   Appearance:   Casual     Behavior:  Appropriate  Motor:  Normal  Speech/Language:   Clear and Coherent, some pressured  Affect:  Tearful, congruent  Mood:  Depressed, anxious  Thought process:  normal  Thought content:    WNL  Sensory/Perceptual disturbances:    WNL  Orientation:  x4  Attention:  Good  Concentration:  Good  Memory:  intact  Fund of knowledge:   Good  Insight:    developing  Judgment:   Good  Impulse Control:  Good   Reported Symptoms:  Irritable, depressed, anxiety, anger outbursts (verbal), tearful, feelings of inadequacy  Risk Assessment: Danger to Self:  No Self-injurious Behavior: No Danger to Others: No Duty to Warn:no Physical Aggression / Violence:No  Access to Firearms a concern: No  Gang Involvement:No  Patient / guardian was educated about steps to take if suicide or homicide risk level increases between visits: yes While future psychiatric events cannot be accurately predicted, the patient does not currently require acute inpatient psychiatric care and does not currently meet [facility] Christian [name] involuntary commitment criteria.  Subjective:  Patient presents for session continuing to share recent stressors.  He stated that his family went on a short vacation about a week ago where he found himself unable to relax.  He stated that he did not want to go but his wife was strongly encouraging.  He stated that he had some panic attacks and was crying often.  He stated that he called his primary care physician and engaged in a virtual visit where he was started on Lexapro 10 mg.  He stated that he experienced some nausea and was given a medication to treat but some nausea remains.  He stated that he recently weaned himself off his migraine  medications, finds himself struggling at night to sleep through the night, wakes up about 3 AM, ruminating with a focus on money stress primarily as he learned recently his wife had 3 credit cards open that he was unaware of that have high balances.  Through guided discovery, he identified the day that he felt his depression and anxiety increased which was about 3 weeks ago when he could not spread mulch in the yard Christian [name] to his back issues.  He stated that his wife can be critical of him as discussed in last sessions.  He stated that sh told him recently that she is uncertain if she could ever "get over the lawsuit" referring to the lawsuit they were engaged in related to their son and the school he attended about 2 years ago.  He stated that she blames him for "choosing our son over her".  Patient stated that he was trying to advocate for their son Christian [name] to the incident that occurred at school, that his son wanted his parents to do so and how his wife in the beginning was adamant that they take steps to hold the school accountable.  He identified also feeling that he is to blame, take some responsibility for the issues they have in their marriage while also appearing to understand that some of the comments his wife makes at times are belittling and changing to him.  Having difficulty to relax, we discussed and provided resource for him to engage in some mindfulness exercises and diaphragmatic breathing.  We recommended he schedule for psychiatric eval following today's session.  Intervention: CBT, supportive therapy  Diagnoses:    ICD-10-CM   1. Adjustment disorder with mixed anxiety and depressed mood  F43.23      Plan: Patient is to use CBT, mindfulness and coping skills to help manage decrease symptoms associated with their diagnosis.  Patient to follow through with coping skills discussed, diaphragmatic breathing mindfulness exercises.  Patient to follow through with psychiatric evaluation.  Patient to  continue to use coping skills and communication strategies.   Long-term goal:    Reduce overall level, frequency, and intensity of the feelings of depression, anxiety and panic evidenced by decreased irritability, negative self talk  from 6 to 7 days/week to 0 to 1 days/week per client report for at least 3 consecutive months.  Patient to follow through with getting enough rest and nutrition as this will assist in helping his mood. He is to also reach out to his wife for support.  Short-term goal:  Verbally express understanding of the relationship between feelings of depression, anxiety and their impact on thinking patterns and behaviors. Verbalize an understanding of the role that distorted thinking plays in creating fears, excessive worry, and ruminations. Improve effective communication skills to reduce relational stress Decrease irritability frequency and anger outbursts  Progress: Progressing  Christian [name] Session, [facility]

## 2019-11-20 ENCOUNTER — Ambulatory Visit (INDEPENDENT_AMBULATORY_CARE_PROVIDER_SITE_OTHER): Payer: BC Managed Care – PPO | Admitting: Neurology

## 2019-11-20 ENCOUNTER — Encounter: Payer: Self-pay | Admitting: Neurology

## 2019-11-20 VITALS — BP 108/73 | HR 69 | Ht 69.0 in | Wt 253.3 lb

## 2019-11-20 DIAGNOSIS — R634 Abnormal weight loss: Secondary | ICD-10-CM | POA: Diagnosis not present

## 2019-11-20 DIAGNOSIS — G4733 Obstructive sleep apnea (adult) (pediatric): Secondary | ICD-10-CM | POA: Diagnosis not present

## 2019-11-20 DIAGNOSIS — R519 Headache, unspecified: Secondary | ICD-10-CM | POA: Diagnosis not present

## 2019-11-20 DIAGNOSIS — E669 Obesity, unspecified: Secondary | ICD-10-CM | POA: Diagnosis not present

## 2019-11-20 NOTE — Patient Instructions (Signed)
As discussed, we will reevaluate your sleep with a sleep study.  Hopefully, we can bring you in for sleep testing.  You have lost quite a bit of weight which is commendable.  Please continue to work on it.  I am glad to hear that you are sleeping better.  We will call you with your test results and take it from there.  We may be able to explore a dental treatment option for sleep apnea versus surgical evaluation, possibly a evaluation for a implantable device for treatment of sleep apnea called inspire.  We will keep you posted as to your test results by phone call.

## 2019-11-20 NOTE — Progress Notes (Signed)
Subjective:    Patient ID: Christian [name] is a 44 y.o. male.  HPI     Interim history:   Christian [name] is a 44 year old right-handed gentleman with an underlying medical history of hypertension, impaired fasting glucose, migraine headaches, anxiety, and morbid obesity with a BMI of over 40, who Presents for follow-up consultation of his obstructive sleep apnea for reevaluation.  Patient is unaccompanied today.  I first met him on 01/23/2019 at the request of his primary care physician, at which time he reported snoring and daytime somnolence and recurrent headaches.  He was advised to proceed with a sleep study.  A home sleep test from 02/04/2019 indicated severe obstructive sleep apnea with a total AHI of 31.5/hour and O2 nadir of 79%.  He had an interim appointment with Debbora Presto, nurse practitioner on 05/01/2020, at which time he reported that he was no longer on Pap therapy.  He had returned his AutoPap machine in October and reported that he could not tolerate it.  He was working on weight loss.  He was advised to follow-up in 6 months for reevaluation.   Today, 11/20/2019: He reports sleeping better.  He snores less per wife's report.  He also has less daytime somnolence, Epworth sleepiness score is 8 out of 24 today, fatigue severity score is 32 out of 63.  He reports that he could not tolerate the CPAP.  He has occasional morning headaches.  He is taking Trokendi once daily for migraine management and takes Maxalt as needed. He is followed by [facility] neurology for his migraines. He has lost weight.  He reports having lost about 30 pounds.  He continues to work on it.  He would like to have his sleep apnea reevaluated with an actual laboratory attended sleep study.  The patient's allergies, current medications, family history, past medical history, past social history, past surgical history and problem list were reviewed and updated as appropriate.  Previously:   01/23/19: (He) reports snoring and  excessive daytime somnolence.  I reviewed your office record from 01/16/2019 when he saw Etta Grandchild, Pharm.D.  His Epworth sleepiness score is 18 out of 24, fatigue severity score is 41 out of 63.  He had a sleep study several years ago but is never been on CPAP therapy.  Since the COVID-19 pandemic he has had a more erratic sleep schedule, generally, he tries to be in bed between 10 PM and 11:30 PM, rise time is currently around 6 or 6:15 AM.  He has woken himself up with a sense of gasping for air but more so with sharp frontal headaches which are short-lived but do wake him up in the mornings.  He has a family history of sleep apnea in his mother who has a CPAP machine.  He has discomfort at night especially in his mid and lower back, he has been seeing Dr. Nelva Bush and has received injections into the back since 2019 which have been helpful, more so after the first time.  He is a non-smoker, he drinks alcohol rarely, caffeine daily in the form of soda, 2-3 diet soda cans per day on average.  He would be willing to consider a CPAP machine but would rather use an oral appliance.  He has gained weight.  Since his sleep study several years ago he estimates that he is about 40 pounds heavier.  He works as a Cytogeneticist.  He has 3 children, ages 58 year old son, 78 year old daughter and 51-year-old son.  His weight  has been fluctuating, up to 20 pounds up or down.  Since the COVID-19 pandemic he has gained quite a bit of weight he reports.  He denies any telltale symptoms of restless leg syndrome.  He does not have night to night nocturia.   His Past Medical History Is Significant For: Past Medical History:  Diagnosis Date  . Diabetes ([place])   . Hypertension   . Meningitis   . Metabolic syndrome   . Migraine headache   . Obesity   . Snoring     His Past Surgical History Is Significant For: Past Surgical History:  Procedure Laterality Date  . ABDOMINAL SURGERY    . APPENDECTOMY    . UMBILICAL  HERNIA REPAIR      His Family History Is Significant For: Family History  Problem Relation Age of Onset  . Fibromyalgia Mother   . Neurodegenerative disease Mother   . CVA Father   . CAD Father   . Diabetes Father   . Hypertension Father   . Obesity Father   . Liver cancer Maternal Grandfather   . Heart attack Maternal Grandfather   . Lung disease Paternal Grandmother   . Heart attack Paternal Grandfather   . Diabetes Paternal Grandfather     His Social History Is Significant For: Social History   Socioeconomic History  . Marital status: Married    Spouse name: Not on file  . Number of children: Not on file  . Years of education: Not on file  . Highest education level: Not on file  Occupational History  . Not on file  Tobacco Use  . Smoking status: Never Smoker  . Smokeless tobacco: Never Used  Substance and Sexual Activity  . Alcohol use: Yes    Comment: Occassional Beer   . Drug use: No  . Sexual activity: Not on file  Other Topics Concern  . Not on file  Social History Narrative  . Not on file   Social Determinants of Health   Financial Resource Strain:   . Difficulty of Paying Living Expenses:   Food Insecurity:   . Worried About Charity fundraiser in the Last Year:   . Arboriculturist in the Last Year:   Transportation Needs:   . Film/video editor (Medical):   Marland Kitchen Lack of Transportation (Non-Medical):   Physical Activity:   . Days of Exercise per Week:   . Minutes of Exercise per Session:   Stress:   . Feeling of Stress :   Social Connections:   . Frequency of Communication with Friends and Family:   . Frequency of Social Gatherings with Friends and Family:   . Attends Religious Services:   . Active Member of Clubs or Organizations:   . Attends Archivist Meetings:   Marland Kitchen Marital Status:     His Allergies Are:  No Known Allergies:   His Current Medications Are:  Outpatient Encounter Medications as of 11/20/2019  Medication Sig  .  baclofen (LIORESAL) 10 MG tablet Take 10 mg by mouth 3 (three) times daily as needed.  Marland Kitchen escitalopram (LEXAPRO) 10 MG tablet Take 10 mg by mouth daily.  Marland Kitchen lisinopril-hydrochlorothiazide (ZESTORETIC) 10-12.5 MG tablet Take 1 tablet by mouth daily.  . rizatriptan (MAXALT) 10 MG tablet Take 10 mg by mouth as needed.  . rosuvastatin (CRESTOR) 10 MG tablet Take 10 mg by mouth at bedtime.  Marland Kitchen TROKENDI XR 200 MG CP24 Take 1 capsule by mouth daily.  . [DISCONTINUED] predniSONE (  STERAPRED UNI-PAK 21 TAB) 10 MG (21) TBPK tablet   . [DISCONTINUED] Semaglutide (OZEMPIC, 1 MG/DOSE, ) Inject 0.75 mg into the skin once a week.   . [DISCONTINUED] topiramate (TOPAMAX) 50 MG tablet Take 150 mg by mouth at bedtime.    No facility-administered encounter medications on file as of 11/20/2019.  :  Review of Systems:  Out of a complete 14 point review of systems, all are reviewed and negative with the exception of these symptoms as listed below: Review of Systems  Neurological:       Here to discuss repeating in lab sleep study. Pt reports he discussed with NP at last visit. Pt sts he is sleeping better at night, reports snoring has improved but occasionally will wake up and not be able to fall back asleep.  Could not tolerate cpap.     Objective:  Neurological Exam  Physical Exam Physical Examination:   Vitals:   11/20/19 1400  BP: 108/73  Pulse: 69    General Examination: The patient is a very pleasant 44 y.o. male in no acute distress. He appears well-developed and well-nourished and well groomed.   HEENT: Normocephalic, atraumatic, pupils are equal, round and reactive to light. Tracking is well preserved.  Corrective eyeglasses in place.  Hearing is grossly intact.  Face is symmetric with normal facial animation.  Speech is clear without dysarthria, hypophonia or voice tremor.  He has no facial or neck tremor.  Airway examination reveals a moderately crowded airway secondary to tonsillar size of about  2+, Mallampati is class II, tongue protrudes centrally and palate elevates symmetrically, neck circumference of 18 inches.  Chest: Clear to auscultation without wheezing, rhonchi or crackles noted.  Heart: S1+S2+0, regular and normal without murmurs, rubs or gallops noted.   Abdomen: Soft, non-tender and non-distended.  Extremities: There is no obvious edema in the distal lower extremities bilaterally.  Skin: Warm and dry without trophic changes noted. There are no varicose veins.  Musculoskeletal: exam reveals no obvious joint deformities, tenderness or joint swelling or erythema.   Neurologically:  Mental status: The patient is awake, alert and oriented in all 4 spheres. His immediate and remote memory, attention, language skills and fund of knowledge are appropriate. There is no evidence of aphasia, agnosia, apraxia or anomia. Speech is clear with normal prosody and enunciation. Thought process is linear. Mood is normal and affect is normal.  Cranial nerves II - XII are as described above under HEENT exam.  Motor exam: Normal bulk, strength and tone is noted. There is no tremor. Fine motor skills and coordination: grossly intact.  Cerebellar testing: No dysmetria or intention tremor. There is no truncal or gait ataxia.  Sensory exam: intact to light touch.   Assessment and Plan:   In summary, JAMARKIS BRANAM is a very pleasant 44 year old male with an underlying medical history of hypertension, impaired fasting glucose, migraine headaches, anxiety, and obesity, who presents for follow-up consultation of his obstructive sleep apnea, which was determined to be in the severe range by home sleep testing in September 2020.  He has since then lost approximately 25 to 30 pounds.  He could not tolerate AutoPap.  He returned his machine in October 2020.  He would be willing to get reevaluated.  We talked about the importance of treating moderate to severe obstructive sleep apnea especially  with regards to long-term risks for cardiovascular disease.  We talked about alternative treatment options today including exploring a dental device versus surgical treatment  option including an implantable hypoglossal nerve stimulator potentially.  We both pick up our discussion after testing.  He is advised to return for laboratory sleep study, we will call to schedule this hopefully soon.  We will keep him posted as to his test results initially by phone call.  He is commended on his weight loss success and encouraged to continue to work on it.  He is advised to call us with any interim questions or concerns.  I answered all his questions today and he was in agreement with the plan.  I spent 20 minutes in total face-to-face time and in reviewing records during pre-charting, more than 50% of which was spent in counseling and coordination of care, reviewing test results, reviewing medications and treatment regimen and/or in discussing or reviewing the diagnosis of OSA, the prognosis and treatment options. Pertinent laboratory and imaging test results that were available during this visit with the patient were reviewed by me and considered in my medical decision making (see chart for details).

## 2019-11-25 ENCOUNTER — Ambulatory Visit (INDEPENDENT_AMBULATORY_CARE_PROVIDER_SITE_OTHER): Payer: BC Managed Care – PPO | Admitting: Adult Health

## 2019-11-25 ENCOUNTER — Encounter: Payer: Self-pay | Admitting: Adult Health

## 2019-11-25 ENCOUNTER — Other Ambulatory Visit: Payer: Self-pay

## 2019-11-25 VITALS — BP 116/73 | HR 64 | Ht 69.0 in | Wt 242.0 lb

## 2019-11-25 DIAGNOSIS — F411 Generalized anxiety disorder: Secondary | ICD-10-CM

## 2019-11-25 DIAGNOSIS — F331 Major depressive disorder, recurrent, moderate: Secondary | ICD-10-CM

## 2019-11-25 DIAGNOSIS — Z630 Problems in relationship with spouse or partner: Secondary | ICD-10-CM

## 2019-11-25 DIAGNOSIS — F410 Panic disorder [episodic paroxysmal anxiety] without agoraphobia: Secondary | ICD-10-CM

## 2019-11-25 DIAGNOSIS — G4700 Insomnia, unspecified: Secondary | ICD-10-CM

## 2019-11-25 MED ORDER — CLORAZEPATE DIPOTASSIUM 3.75 MG PO TABS: 3.7500 mg | ORAL_TABLET | Freq: Two times a day (BID) | ORAL | 3 refills | Status: DC | PRN

## 2019-11-25 MED ORDER — ESCITALOPRAM OXALATE 20 MG PO TABS: 20.0000 mg | ORAL_TABLET | Freq: Every day | ORAL | 2 refills | Status: DC

## 2019-11-25 NOTE — Progress Notes (Signed)
[facility] MD/PA/NP Initial Note  11/25/2019 10:56 AM Christian [name]  MRN:  [id number]  Chief Complaint:   HPI:   Describes mood today as "not the best". Pleasant. Flat. Mood symptoms - reports depression, constant anxiety and feeling scared, panic attacks, and denies irritability. Has never really had depression. Has had "bouts and can normally separate himself from it". Difficulties making decisions. PCP started him on Lexapro 10mg  3 weeks ago. Feels like it has helped with tearfulness. Not feeling as depressed. Remains more anxious overall. Stating "mornings are the worst". Has been so "overwhelmed" with anxiety that he "throws up". He and wife having issues - "I'm on pins and needles". Feels like Covid has been a factor in their relationship. Cannot separate work from home life. Family stressors - children. Wife having issues with depression. Stating "I can usually rally and help her, but I can't right now". Stating "we haven't been able to get along". Wife told him this morning she was leaving him. Decreased interest and motivation. Taking medications as prescribed.  Energy levels lower. Active, does not have a regular exercise routine.  Enjoys some usual interests and activities. Watchig TV to pass the time. Married - lives with wife of 17 years and 3 children 85, 73, and 23. Spending time with family. Appetite decreased - eats once a day. Weight loss - 249 down from 280 pounds over past 4 months. Sleeping difficulties. Can get to sleep but wakes up and can't get back to sleep.  Averages 4 to 6 hours. Ongoing for a year or longer.  Focus and concentration stable. Completing tasks. Managing aspects of household. Works full-time from home for 8. Goes back into the office on August 1st.  Denies SI or HI. Denies AH or VH.  Previous medication trials: Qysmia  Visit Diagnosis:    ICD-10-CM   1. Generalized anxiety disorder  F41.1 clorazepate (TRANXENE) 3.75 MG tablet    escitalopram  (LEXAPRO) 20 MG tablet  2. Major depressive disorder, recurrent episode, moderate (HCC)  F33.1 escitalopram (LEXAPRO) 20 MG tablet  3. Insomnia, unspecified type  G47.00 clorazepate (TRANXENE) 3.75 MG tablet  4. Marital conflict  Z63.0   5. Panic attacks  F41.0 clorazepate (TRANXENE) 3.75 MG tablet    Past Psychiatric History: Denies psychiatric hospitalization.  Past Medical History:  Past Medical History:  Diagnosis Date  . Diabetes ([facility])   . Hypertension   . Meningitis   . Metabolic syndrome   . Migraine headache   . Obesity   . Snoring     Past Surgical History:  Procedure Laterality Date  . ABDOMINAL SURGERY    . APPENDECTOMY    . UMBILICAL HERNIA REPAIR      Family Psychiatric History: Mother - depression. Grandmother admitted for "mental breakdowns.   Family History:  Family History  Problem Relation Age of Onset  . Fibromyalgia Mother   . Neurodegenerative disease Mother   . CVA Father   . CAD Father   . Diabetes Father   . Hypertension Father   . Obesity Father   . Liver cancer Maternal Grandfather   . Heart attack Maternal Grandfather   . Lung disease Paternal Grandmother   . Heart attack Paternal Grandfather   . Diabetes Paternal Grandfather     Social History:  Social History   Socioeconomic History  . Marital status: Married    Spouse name: Not on file  . Number of children: Not on file  . Years of education: Not on file  .  Highest education level: Not on file  Occupational History  . Not on file  Tobacco Use  . Smoking status: Never Smoker  . Smokeless tobacco: Never Used  Substance and Sexual Activity  . Alcohol use: Yes    Comment: Occassional Beer   . Drug use: No  . Sexual activity: Not on file  Other Topics Concern  . Not on file  Social History Narrative  . Not on file   Social Determinants of Health   Financial Resource Strain:   . Difficulty of Paying Living Expenses:   Food Insecurity:   . Worried About Brewing technologist in the Last Year:   . Barista in the Last Year:   Transportation Needs:   . Freight forwarder (Medical):   Marland Kitchen Lack of Transportation (Non-Medical):   Physical Activity:   . Days of Exercise per Week:   . Minutes of Exercise per Session:   Stress:   . Feeling of Stress :   Social Connections:   . Frequency of Communication with Friends and Family:   . Frequency of Social Gatherings with Friends and Family:   . Attends Religious Services:   . Active Member of Clubs or Organizations:   . Attends Banker Meetings:   Marland Kitchen Marital Status:     Allergies: No Known Allergies  Metabolic Disorder Labs: No results found for: HGBA1C, MPG No results found for: PROLACTIN No results found for: CHOL, TRIG, HDL, CHOLHDL, VLDL, LDLCALC No results found for: TSH  Therapeutic Level Labs: No results found for: LITHIUM No results found for: VALPROATE No components found for:  CBMZ  Current Medications: Current Outpatient Medications  Medication Sig Dispense Refill  . baclofen (LIORESAL) 10 MG tablet Take 10 mg by mouth 3 (three) times daily as needed.    . clorazepate (TRANXENE) 3.75 MG tablet Take 1 tablet (3.75 mg total) by mouth 2 (two) times daily as needed for anxiety. 30 tablet 3  . escitalopram (LEXAPRO) 20 MG tablet Take 1 tablet (20 mg total) by mouth daily. 30 tablet 2  . lisinopril-hydrochlorothiazide (ZESTORETIC) 10-12.5 MG tablet Take 1 tablet by mouth daily.    . ondansetron (ZOFRAN) 4 MG tablet Take 4 mg by mouth every 6 (six) hours as needed.    . prochlorperazine (COMPAZINE) 10 MG tablet Take 10 mg by mouth every 8 (eight) hours as needed.    . rizatriptan (MAXALT) 10 MG tablet Take 10 mg by mouth as needed.    . rosuvastatin (CRESTOR) 10 MG tablet Take 10 mg by mouth at bedtime.    Marland Kitchen TROKENDI XR 200 MG CP24 Take 1 capsule by mouth daily.     No current facility-administered medications for this visit.    Medication Side Effects: none  Orders  placed this visit:  No orders of the defined types were placed in this encounter.   Psychiatric Specialty Exam:  Review of Systems  Musculoskeletal: Negative for gait problem.  Neurological: Negative for tremors.  Psychiatric/Behavioral:       Please refer to HPI    There were no vitals taken for this visit.There is no height or weight on file to calculate BMI.  General Appearance: Casual, Neat and Well Groomed  Eye Contact:  Good  Speech:  Clear and Coherent and Normal Rate  Volume:  Normal  Mood:  Anxious and Depressed  Affect:  Appropriate and Congruent  Thought Process:  Coherent and Descriptions of Associations: Intact  Orientation:  Full (  Time, Place, and Person)  Thought Content: Logical   Suicidal Thoughts:  No  Homicidal Thoughts:  No  Memory:  WNL  Judgement:  Good  Insight:  Good  Psychomotor Activity:  Normal  Concentration:  Concentration: Good  Recall:  Good  Fund of Knowledge: Good  Language: Good  Assets:  Communication Skills Desire for Improvement Financial Resources/Insurance Housing Intimacy Leisure Time Physical Health Resilience Social Support Talents/Skills Transportation Vocational/Educational  ADL's:  Intact  Cognition: WNL  Prognosis:  Good   Screenings:   Receiving Psychotherapy: Yes   Treatment Plan/Recommendations:   Plan:  PDMP reviewed  1. Increase Lexapro 10mg  to 20mg  daily 2. Add Tranxene 3.75mg  BID - anxiety  Read and reviewed note with patient for accuracy.   RTC 4 weeks  Patient advised to contact office with any questions, adverse effects, or acute worsening in signs and symptoms.  Discussed potential benefits, risk, and side effects of benzodiazepines to include potential risk of tolerance and dependence, as well as possible drowsiness.  Advised patient not to drive if experiencing drowsiness and to take lowest possible effective dose to minimize risk of dependence and tolerance.   , NP

## 2019-11-26 ENCOUNTER — Telehealth: Payer: Self-pay

## 2019-11-26 DIAGNOSIS — G4733 Obstructive sleep apnea (adult) (pediatric): Secondary | ICD-10-CM

## 2019-11-26 DIAGNOSIS — M5134 Other intervertebral disc degeneration, thoracic region: Secondary | ICD-10-CM | POA: Diagnosis not present

## 2019-11-26 NOTE — Telephone Encounter (Signed)
[employer] denied PSG study. I got a split night approved due to non compliant with cpap. Hopefully the tech can work with him to get adjusedt to cpap.  Is this ok? We will need a Split night order.

## 2019-11-26 NOTE — Telephone Encounter (Signed)
Split night study ordered, per insurance requirement.

## 2019-11-27 ENCOUNTER — Telehealth: Payer: Self-pay

## 2019-11-27 DIAGNOSIS — M5134 Other intervertebral disc degeneration, thoracic region: Secondary | ICD-10-CM | POA: Diagnosis not present

## 2019-11-27 NOTE — Telephone Encounter (Signed)
Pt is not ready to schedule at this time. Pt has my contact info to call me back when ready to schedule.

## 2019-11-28 ENCOUNTER — Ambulatory Visit: Payer: BC Managed Care – PPO | Admitting: Mental Health

## 2019-12-13 ENCOUNTER — Ambulatory Visit: Payer: BC Managed Care – PPO | Admitting: Mental Health

## 2019-12-16 ENCOUNTER — Other Ambulatory Visit: Payer: Self-pay

## 2019-12-16 ENCOUNTER — Ambulatory Visit (INDEPENDENT_AMBULATORY_CARE_PROVIDER_SITE_OTHER): Payer: BC Managed Care – PPO | Admitting: Mental Health

## 2019-12-16 DIAGNOSIS — F411 Generalized anxiety disorder: Secondary | ICD-10-CM | POA: Diagnosis not present

## 2019-12-16 NOTE — Progress Notes (Signed)
[facility] Counselor Psychotherapy Note  Name: Christian [name] Date: 12/16/2019 MRN: [id number] DOB: 25-Jul-1975 PCP: Martha Clan, MD  Time spent: 54 minutes  Treatment: Individual Therapy  Mental Status Exam:   Appearance:   Casual     Behavior:  Appropriate  Motor:  Normal  Speech/Language:   Clear and Coherent, some pressured  Affect:  Tearful, congruent  Mood:  Depressed, anxious  Thought process:  normal  Thought content:    WNL  Sensory/Perceptual disturbances:    WNL  Orientation:  x4  Attention:  Good  Concentration:  Good  Memory:  intact  Fund of knowledge:   Good  Insight:    developing  Judgment:   Good  Impulse Control:  Good   Reported Symptoms:  Irritable, depressed, anxiety, anger outbursts (verbal), tearful, feelings of inadequacy  Risk Assessment: Danger to Self:  No Self-injurious Behavior: No Danger to Others: No Duty to Warn:no Physical Aggression / Violence:No  Access to Firearms a concern: No  Gang Involvement:No  Patient / guardian was educated about steps to take if suicide or homicide risk level increases between visits: yes While future psychiatric events cannot be accurately predicted, the patient does not currently require acute inpatient psychiatric care and does not currently meet [facility] involuntary commitment criteria.  Subjective:  Patient presents for session on time.  He shared progress and recent events.  He stated the family went on a vacation recently and he shared in detail the challenges related to his wife's behavior throughout the trip.  He stated the trip was her idea, he cautioned against going due to their financial issues as well as speculating if the children would really enjoy this type of trip which included a lot of driving.  He shared how his wife had many "emotional breakdowns", sharing some details in his attempts to try and manage the many attempts of her trying to go back home on earlier flights without them.   Also, her mentioning that she wanted a separation multiple times and in front of the children stating that they were getting a divorce which was distressful to them per his report.  He stated he learned later that she was off one of her medications which was able to be called and in her take toward the end of the trip.  He stated he is unsure of what she takes or any history of diagnosis as she keeps this information guarded.  He identified and made a plan toward self-care daily centered around work where he is returning to the office to be more consistent with tasks and to be able to focus.  Other ways to cope and care for himself were explored, being able to make some downtime for himself to do stress utilization of exercises in this process was encouraged.  He said his current psychiatric medications recently prescribed have been helpful.  Intervention: CBT, supportive therapy  Diagnoses:    ICD-10-CM   1. Generalized anxiety disorder  F41.1      Plan: Patient is to use CBT, mindfulness and coping skills to help manage decrease symptoms associated with their diagnosis.  Patient to follow through with coping skills discussed, diaphragmatic breathing mindfulness exercises.  Patient to continue to use coping skills and communication strategies.   Long-term goal:    Reduce overall level, frequency, and intensity of the feelings of depression, anxiety and panic evidenced by decreased irritability, negative self talk  from 6 to 7 days/week to 0 to 1 days/week per client  report for at least 3 consecutive months.  Patient to follow through with getting enough rest and nutrition as this will assist in helping his mood. He is to also reach out to his wife for support.  Short-term goal:  Verbally express understanding of the relationship between feelings of depression, anxiety and their impact on thinking patterns and behaviors. Verbalize an understanding of the role that distorted thinking plays in  creating fears, excessive worry, and ruminations. Improve effective communication skills to reduce relational stress Decrease irritability frequency and anger outbursts  Progress: Progressing  [name] Session, [facility]

## 2019-12-19 ENCOUNTER — Other Ambulatory Visit: Payer: Self-pay | Admitting: Adult Health

## 2019-12-19 DIAGNOSIS — F411 Generalized anxiety disorder: Secondary | ICD-10-CM

## 2019-12-19 DIAGNOSIS — F331 Major depressive disorder, recurrent, moderate: Secondary | ICD-10-CM

## 2019-12-23 ENCOUNTER — Ambulatory Visit: Payer: BC Managed Care – PPO | Admitting: Adult Health

## 2019-12-23 ENCOUNTER — Telehealth: Payer: Self-pay

## 2019-12-23 NOTE — Telephone Encounter (Signed)
Unable to LVM for pt to call me back to schedule sleep study. Voicemail box is full. Will try calling again soon.

## 2019-12-24 ENCOUNTER — Encounter: Payer: Self-pay | Admitting: Adult Health

## 2019-12-24 ENCOUNTER — Ambulatory Visit (INDEPENDENT_AMBULATORY_CARE_PROVIDER_SITE_OTHER): Payer: BC Managed Care – PPO | Admitting: Adult Health

## 2019-12-24 ENCOUNTER — Other Ambulatory Visit: Payer: Self-pay

## 2019-12-24 DIAGNOSIS — F411 Generalized anxiety disorder: Secondary | ICD-10-CM | POA: Diagnosis not present

## 2019-12-24 DIAGNOSIS — F331 Major depressive disorder, recurrent, moderate: Secondary | ICD-10-CM

## 2019-12-24 DIAGNOSIS — Z630 Problems in relationship with spouse or partner: Secondary | ICD-10-CM

## 2019-12-24 DIAGNOSIS — G4700 Insomnia, unspecified: Secondary | ICD-10-CM

## 2019-12-24 NOTE — Progress Notes (Signed)
Christian [name] [id number] 1976-02-16 44 y.o.  Subjective:   Patient ID:  Christian [name] is a 44 y.o. (DOB 1976/03/09) male.  Chief Complaint: No chief complaint on file.   HPI Christian [name] presents to the office today for follow-up of MDD, GAD, insomnia, and marital conflict.  Describes mood today as "not the best". Pleasant. Denies tearfulness. Mood symptoms - reports decreased depression, anxiety and panic attacks. Denies irritability. Able to make decisions. Family feels he is "stoic" - not talking as much. Not showing as much "emotion". Stating "I feel numb". Also stating "anything is better than where I was". Recently went on a family vacation - "it was a disaster". Not taking the Tranxene regularly. Mornings are better - "not waking up and throwing up". Wife looking at apartments - "unhappy in marriage". Stating "we go back and forth and back and forth". Improved interest and motivation. Taking medications as prescribed.   Energy levels improved. Active, does not have a regular exercise routine.  Enjoys some usual interests and activities. Married. Lives with wife of 17 years and 3 children 89, 64, and 9. Spending time with family. Appetite decreased - eats once a day. Weight loss - 245 down from 280 pounds over past 4 months. Sleeping well most nights. Averages 8 hours.   Focus and concentration stable. Completing tasks. Managing aspects of household. Works full-time from home for [employer].   Denies SI or HI. Denies AH or VH.  Previous medication trials: Qysmia  Review of Systems:  Review of Systems  Musculoskeletal: Negative for gait problem.  Neurological: Negative for tremors.  Psychiatric/Behavioral:       Please refer to HPI    Medications: I have reviewed the patient's current medications.  Current Outpatient Medications  Medication Sig Dispense Refill  . baclofen (LIORESAL) 10 MG tablet Take 10 mg by mouth 3 (three) times daily as needed.    . clorazepate  (TRANXENE) 3.75 MG tablet Take 1 tablet (3.75 mg total) by mouth 2 (two) times daily as needed for anxiety. 30 tablet 3  . escitalopram (LEXAPRO) 20 MG tablet Take 1 tablet (20 mg total) by mouth daily. 30 tablet 2  . lisinopril-hydrochlorothiazide (ZESTORETIC) 10-12.5 MG tablet Take 1 tablet by mouth daily.    . ondansetron (ZOFRAN) 4 MG tablet Take 4 mg by mouth every 6 (six) hours as needed.    . prochlorperazine (COMPAZINE) 10 MG tablet Take 10 mg by mouth every 8 (eight) hours as needed.    . rizatriptan (MAXALT) 10 MG tablet Take 10 mg by mouth as needed.    . rosuvastatin (CRESTOR) 10 MG tablet Take 10 mg by mouth at bedtime.    Marland Kitchen TROKENDI XR 200 MG CP24 Take 1 capsule by mouth daily.     No current facility-administered medications for this visit.    Medication Side Effects: None  Allergies: No Known Allergies  Past Medical History:  Diagnosis Date  . Diabetes (HCC)   . Hypertension   . Meningitis   . Metabolic syndrome   . Migraine headache   . Obesity   . Snoring     Family History  Problem Relation Age of Onset  . Fibromyalgia Mother   . Neurodegenerative disease Mother   . CVA Father   . CAD Father   . Diabetes Father   . Hypertension Father   . Obesity Father   . Liver cancer Maternal Grandfather   . Heart attack Maternal Grandfather   . Lung disease  Paternal Grandmother   . Heart attack Paternal Grandfather   . Diabetes Paternal Grandfather     Social History   Socioeconomic History  . Marital status: Married    Spouse name: Not on file  . Number of children: Not on file  . Years of education: Not on file  . Highest education level: Not on file  Occupational History  . Not on file  Tobacco Use  . Smoking status: Never Smoker  . Smokeless tobacco: Never Used  Substance and Sexual Activity  . Alcohol use: Yes    Comment: Occassional Beer   . Drug use: No  . Sexual activity: Not on file  Other Topics Concern  . Not on file  Social History  Narrative  . Not on file   Social Determinants of Health   Financial Resource Strain:   . Difficulty of Paying Living Expenses:   Food Insecurity:   . Worried About Programme researcher, broadcasting/film/video in the Last Year:   . Barista in the Last Year:   Transportation Needs:   . Freight forwarder (Medical):   Marland Kitchen Lack of Transportation (Non-Medical):   Physical Activity:   . Days of Exercise per Week:   . Minutes of Exercise per Session:   Stress:   . Feeling of Stress :   Social Connections:   . Frequency of Communication with Friends and Family:   . Frequency of Social Gatherings with Friends and Family:   . Attends Religious Services:   . Active Member of Clubs or Organizations:   . Attends Banker Meetings:   Marland Kitchen Marital Status:   Intimate Partner Violence:   . Fear of Current or Ex-Partner:   . Emotionally Abused:   Marland Kitchen Physically Abused:   . Sexually Abused:     Past Medical History, Surgical history, Social history, and Family history were reviewed and updated as appropriate.   Please see review of systems for further details on the patient's review from today.   Objective:   Physical Exam:  There were no vitals taken for this visit.  Physical Exam Constitutional:      General: He is not in acute distress. Musculoskeletal:        General: No deformity.  Neurological:     Mental Status: He is alert and oriented to person, place, and time.     Coordination: Coordination normal.  Psychiatric:        Attention and Perception: Attention and perception normal. He does not perceive auditory or visual hallucinations.        Mood and Affect: Mood normal. Mood is not anxious or depressed. Affect is not labile, blunt, angry or inappropriate.        Speech: Speech normal.        Behavior: Behavior normal.        Thought Content: Thought content normal. Thought content is not paranoid or delusional. Thought content does not include homicidal or suicidal ideation.  Thought content does not include homicidal or suicidal plan.        Cognition and Memory: Cognition and memory normal.        Judgment: Judgment normal.     Comments: Insight intact     Lab Review:     Component Value Date/Time   NA 141 04/19/2014 1152   K 4.1 04/19/2014 1152   CL 102 04/19/2014 1152   CO2 25 04/19/2014 1152   GLUCOSE 96 04/19/2014 1152   BUN 11 04/19/2014  1152   CREATININE 0.95 04/19/2014 1152   CALCIUM 9.4 04/19/2014 1152   PROT 8.0 04/19/2014 1152   ALBUMIN 4.1 04/19/2014 1152   AST 18 04/19/2014 1152   ALT 23 04/19/2014 1152   ALKPHOS 92 04/19/2014 1152   BILITOT 0.3 04/19/2014 1152   GFRNONAA >90 04/19/2014 1152   GFRAA >90 04/19/2014 1152       Component Value Date/Time   WBC 7.0 04/19/2014 1152   RBC 5.38 04/19/2014 1152   HGB 15.8 04/19/2014 1152   HGB 14.8 11/25/2008 1520   HCT 46.9 04/19/2014 1152   HCT 43.3 11/25/2008 1520   PLT 152 04/19/2014 1152   PLT 196 11/25/2008 1520   MCV 87.2 04/19/2014 1152   MCV 87.8 11/25/2008 1520   MCH 29.4 04/19/2014 1152   MCHC 33.7 04/19/2014 1152   RDW 13.0 04/19/2014 1152   RDW 13.8 11/25/2008 1520   LYMPHSABS 1.4 04/19/2014 1152   LYMPHSABS 2.0 11/25/2008 1520   MONOABS 1.0 04/19/2014 1152   MONOABS 0.7 11/25/2008 1520   EOSABS 0.1 04/19/2014 1152   EOSABS 0.1 11/25/2008 1520   BASOSABS 0.0 04/19/2014 1152   BASOSABS 0.1 11/25/2008 1520    No results found for: POCLITH, LITHIUM   No results found for: PHENYTOIN, PHENOBARB, VALPROATE, CBMZ   .res Assessment: Plan:    Plan:  PDMP reviewed  1. Continue Lexapro 20mg  daily 2. ContinueTranxene 3.75mg  BID - anxiety  RTC 4 weeks  Patient advised to contact office with any questions, adverse effects, or acute worsening in signs and symptoms.  Discussed potential benefits, risk, and side effects of benzodiazepines to include potential risk of tolerance and dependence, as well as possible drowsiness.  Advised patient not to drive if  experiencing drowsiness and to take lowest possible effective dose to minimize risk of dependence and tolerance.    Diagnoses and all orders for this visit:  Generalized anxiety disorder  Major depressive disorder, recurrent episode, moderate (HCC)  Insomnia, unspecified type  Marital conflict     Please see After Visit Summary for patient specific instructions.  Future Appointments  Date Time Provider Department Center  01/09/2020  3:00 PM 01/11/2020, [facility] None  01/23/2020  9:00 AM 03/24/2020, [facility] None  02/06/2020  9:00 AM 02/08/2020, [facility] None    No orders of the defined types were placed in this encounter.   -------------------------------

## 2019-12-26 ENCOUNTER — Telehealth: Payer: Self-pay

## 2019-12-26 NOTE — Telephone Encounter (Signed)
LVM for pt to call me back to schedule sleep study  

## 2020-01-09 ENCOUNTER — Other Ambulatory Visit: Payer: Self-pay

## 2020-01-09 ENCOUNTER — Ambulatory Visit (INDEPENDENT_AMBULATORY_CARE_PROVIDER_SITE_OTHER): Payer: BC Managed Care – PPO | Admitting: Mental Health

## 2020-01-09 DIAGNOSIS — F411 Generalized anxiety disorder: Secondary | ICD-10-CM
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
## 2020-01-23 ENCOUNTER — Ambulatory Visit: Payer: BC Managed Care – PPO | Admitting: Mental Health

## 2020-01-28 ENCOUNTER — Telehealth (INDEPENDENT_AMBULATORY_CARE_PROVIDER_SITE_OTHER): Payer: BC Managed Care – PPO | Admitting: Adult Health

## 2020-01-28 ENCOUNTER — Encounter: Payer: Self-pay | Admitting: Adult Health

## 2020-01-28 DIAGNOSIS — F411 Generalized anxiety disorder: Secondary | ICD-10-CM

## 2020-01-28 DIAGNOSIS — F331 Major depressive disorder, recurrent, moderate: Secondary | ICD-10-CM | POA: Diagnosis not present

## 2020-01-28 DIAGNOSIS — F410 Panic disorder [episodic paroxysmal anxiety] without agoraphobia: Secondary | ICD-10-CM

## 2020-01-28 DIAGNOSIS — G4700 Insomnia, unspecified: Secondary | ICD-10-CM

## 2020-01-28 DIAGNOSIS — F4323 Adjustment disorder with mixed anxiety and depressed mood: Secondary | ICD-10-CM

## 2020-01-28 MED ORDER — ESCITALOPRAM OXALATE 20 MG PO TABS: 20.0000 mg | ORAL_TABLET | Freq: Every day | ORAL | 2 refills | Status: DC

## 2020-01-28 MED ORDER — CLORAZEPATE DIPOTASSIUM 3.75 MG PO TABS: 3.7500 mg | ORAL_TABLET | Freq: Two times a day (BID) | ORAL | 3 refills | Status: DC | PRN
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
## 2020-01-29 DIAGNOSIS — M47814 Spondylosis without myelopathy or radiculopathy, thoracic region: Secondary | ICD-10-CM | POA: Diagnosis not present

## 2020-01-29 DIAGNOSIS — M5134 Other intervertebral disc degeneration, thoracic region: Secondary | ICD-10-CM | POA: Diagnosis not present

## 2020-02-04 DIAGNOSIS — Z6841 Body Mass Index (BMI) 40.0 and over, adult: Secondary | ICD-10-CM | POA: Diagnosis not present

## 2020-02-04 DIAGNOSIS — G43709 Chronic migraine without aura, not intractable, without status migrainosus: Secondary | ICD-10-CM | POA: Diagnosis not present

## 2020-02-04 DIAGNOSIS — G43009 Migraine without aura, not intractable, without status migrainosus: Secondary | ICD-10-CM | POA: Diagnosis not present

## 2020-02-06 ENCOUNTER — Telehealth (INDEPENDENT_AMBULATORY_CARE_PROVIDER_SITE_OTHER): Payer: BC Managed Care – PPO | Admitting: Mental Health

## 2020-02-06 DIAGNOSIS — F331 Major depressive disorder, recurrent, moderate: Secondary | ICD-10-CM

## 2020-02-06 DIAGNOSIS — F411 Generalized anxiety disorder: Secondary | ICD-10-CM
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
## 2020-02-12 ENCOUNTER — Ambulatory Visit (INDEPENDENT_AMBULATORY_CARE_PROVIDER_SITE_OTHER): Payer: BC Managed Care – PPO | Admitting: Mental Health

## 2020-02-12 DIAGNOSIS — F411 Generalized anxiety disorder: Secondary | ICD-10-CM

## 2020-02-12 DIAGNOSIS — F331 Major depressive disorder, recurrent, moderate: Secondary | ICD-10-CM

## 2020-02-12 NOTE — Progress Notes (Signed)
[facility] Psychotherapy Note  Name: Christian [name] Date: 02/12/2020 MRN: [id number] DOB: [birth year] PCP: Martha Clan, MD  Time spent: 59 minutes  Treatment: Individual Therapy  Mental Status Exam:   Appearance:   Casual     Behavior:  Appropriate  Motor:  Normal  Speech/Language:   Clear and Coherent, some pressured  Affect:  Tearful, congruent  Mood:  Depressed, anxious  Thought process:  normal  Thought content:    WNL  Sensory/Perceptual disturbances:    WNL  Orientation:  x4  Attention:  Good  Concentration:  Good  Memory:  intact  Fund of knowledge:   Good  Insight:    developing  Judgment:   Good  Impulse Control:  Good   Reported Symptoms:  Irritable, depressed, anxiety, anger outbursts (verbal), tearful, feelings of inadequacy  Risk Assessment:  Danger to Self:  No Self-injurious Behavior: No Danger to Others: No Duty to Warn:no Physical Aggression / Violence:No  Access to Firearms a concern: No  Gang Involvement:No  Patient / guardian was educated about steps to take if suicide or homicide risk level increases between visits: yes While future psychiatric events cannot be accurately predicted, the patient does not currently require acute inpatient psychiatric care and does not currently meet [facility] involuntary commitment criteria.  Subjective: Patient engaged in telehealth session via video.  He shared progress and changes.  He stated he continues to work from home as they continue to try and make a decision about making another car purchase.  He stated his wife started a job part-time, he worries that it will not make enough money to constitute getting the job but also identified how he feels his wife may need this to interact more with others socially and get out of the house more often.  He stated that they have had recent arguments as a couple, they continue to stay very busy with their 3 kids, taking them to athletic events.  He stated his  wife will often get upset in the evening due to how busy with her schedule as.  He stated that she has a "horrendous temper", that her family members all are temperamental and tend to raise the voices when upset.  He stated that she often complains about her schedule, the house not being kept up etc.  He shared feelings related to these experiences with her.  He stated that she is not mentioned recently that she wants to leave or separate; last incident was a few weeks ago where he stated that he told her that if that is what she wants to do as a decision she can make for herself.  He shared how he has her status so often he does not know what else to tell her in the situations.  Provide support and understanding throughout, explore ways to cope and care for himself.  He stated that knowing his daughter can get treatment again is important, has an appointment scheduled for therapy next week and hopes that it will be beneficial.   Intervention: CBT, supportive therapy  Diagnoses:    ICD-10-CM   1. Generalized anxiety disorder  F41.1   2. Major depressive disorder, recurrent episode, moderate (HCC)  F33.1      Plan: Patient is to use CBT, mindfulness and coping skills to help manage decrease symptoms associated with their diagnosis.  Patient to follow through with coping skills discussed, diaphragmatic breathing mindfulness exercises.  Patient to continue to use coping skills and communication strategies.  Long-term goal:    Reduce overall level, frequency, and intensity of the feelings of depression, anxiety and panic evidenced by decreased irritability, negative self talk  from 6 to 7 days/week to 0 to 1 days/week per client report for at least 3 consecutive months.  Patient to follow through with getting enough rest and nutrition as this will assist in helping his mood. He is to also reach out to his wife for support.  Short-term goal:  Verbally express understanding of the relationship between  feelings of depression, anxiety and their impact on thinking patterns and behaviors. Verbalize an understanding of the role that distorted thinking plays in creating fears, excessive worry, and ruminations. Improve effective communication skills to reduce relational stress Decrease irritability frequency and anger outbursts  Progress: Progressing  [name] Session, [facility]

## 2020-02-20 DIAGNOSIS — M47814 Spondylosis without myelopathy or radiculopathy, thoracic region: Secondary | ICD-10-CM | POA: Diagnosis not present

## 2020-02-26 ENCOUNTER — Ambulatory Visit (INDEPENDENT_AMBULATORY_CARE_PROVIDER_SITE_OTHER): Payer: BC Managed Care – PPO | Admitting: Mental Health

## 2020-02-26 ENCOUNTER — Other Ambulatory Visit: Payer: Self-pay

## 2020-02-26 DIAGNOSIS — F331 Major depressive disorder, recurrent, moderate: Secondary | ICD-10-CM
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
## 2020-03-11 ENCOUNTER — Ambulatory Visit (INDEPENDENT_AMBULATORY_CARE_PROVIDER_SITE_OTHER): Payer: BC Managed Care – PPO | Admitting: Mental Health

## 2020-03-11 ENCOUNTER — Other Ambulatory Visit: Payer: Self-pay

## 2020-03-11 DIAGNOSIS — F331 Major depressive disorder, recurrent, moderate: Secondary | ICD-10-CM | POA: Diagnosis not present

## 2020-03-11 NOTE — Progress Notes (Signed)
[facility] Psychotherapy Note  Name: Christian [name] Date: 03/11/2020 MRN: [id number] DOB: [birth year] PCP: Martha Clan, MD  Time spent: 54 minutes  Treatment: Individual Therapy  Mental Status Exam:   Appearance:   Casual     Behavior:  Appropriate  Motor:  Normal  Speech/Language:   Clear and Coherent    Affect:  Full range  Mood:  Depressed, anxious  Thought process:  normal  Thought content:    WNL  Sensory/Perceptual disturbances:    WNL  Orientation:  x4  Attention:  Good  Concentration:  Good  Memory:  intact  Fund of knowledge:   WNL  Insight:    good  Judgment:   Good  Impulse Control:  Good   Reported Symptoms:  Irritable, depressed, anxiety, anger outbursts (verbal), tearful, feelings of inadequacy  Risk Assessment:  Danger to Self:  No Self-injurious Behavior: No Danger to Others: No Duty to Warn:no Physical Aggression / Violence:No  Access to Firearms a concern: No  Gang Involvement:No  Patient / guardian was educated about steps to take if suicide or homicide risk level increases between visits: yes While future psychiatric events cannot be accurately predicted, the patient does not currently require acute inpatient psychiatric care and does not currently meet [facility] involuntary commitment criteria.  Subjective:  Patient presents for session sharing recent events and progress.  He shared how he has settled in the need for the family to have enough vehicles, or to call for his son recently where he grappled with the decision due to the amount that he is paying for the vehicle.  He stated that he can afford to pay for the vehicle but questioned if it was the right decision; ultimately he stated he is going to go forward with the purchase and his son getting the car eventually.  Other stressors were assessed, he continues to have family stressors related to his daughter continuing to have daily outbursts, struggles daily with mood instability.   He stated that she is on the autistic spectrum and they have followed through with her psychiatric appointments.  He stated that in their last appointment it was recommended that his daughter receive some residential placement for a period of a few months to allow a "reset" for her in the family due to her ongoing behavioral outbursts in the home and how it is affecting the family.  Patient processed feelings related, identifying how he feels is needed for everyone including himself as he has found himself more upset and agitated with his daughter when she has the outbursts as he often tries to manage the situations that tend to escalate between his daughter and his wife.  He identified being comfortable with with allowing the placement to occur as this is been difficult for him to except in the past.  Provide support and understanding throughout, assisting him in identifying thoughts and feelings related to the family challenges recently as well as the adjustment of his daughter going to a placement possibly in the coming weeks.  Intervention: CBT, supportive therapy  Diagnoses:    ICD-10-CM   1. Major depressive disorder, recurrent episode, moderate (HCC)  F33.1      Plan: Patient is to use CBT, mindfulness and coping skills to help manage decrease symptoms associated with their diagnosis.  Patient to follow through with coping skills discussed, diaphragmatic breathing mindfulness exercises.  Patient to continue to use coping skills and communication strategies.   Long-term goal:    Reduce overall level,  frequency, and intensity of the feelings of depression, anxiety and panic evidenced by decreased irritability, negative self talk  from 6 to 7 days/week to 0 to 1 days/week per client report for at least 3 consecutive months.  Patient to follow through with getting enough rest and nutrition as this will assist in helping his mood. He is to also reach out to his wife for support.  Short-term goal:   Verbally express understanding of the relationship between feelings of depression, anxiety and their impact on thinking patterns and behaviors. Verbalize an understanding of the role that distorted thinking plays in creating fears, excessive worry, and ruminations. Improve effective communication skills to reduce relational stress Decrease irritability frequency and anger outbursts  Progress: Progressing  [name] Session, [facility]

## 2020-03-13 DIAGNOSIS — Z0000 Encounter for general adult medical examination without abnormal findings: Secondary | ICD-10-CM | POA: Diagnosis not present

## 2020-03-13 DIAGNOSIS — E785 Hyperlipidemia, unspecified: Secondary | ICD-10-CM | POA: Diagnosis not present

## 2020-03-13 DIAGNOSIS — Z125 Encounter for screening for malignant neoplasm of prostate: Secondary | ICD-10-CM | POA: Diagnosis not present

## 2020-03-13 DIAGNOSIS — R7301 Impaired fasting glucose: Secondary | ICD-10-CM | POA: Diagnosis not present

## 2020-03-25 ENCOUNTER — Other Ambulatory Visit: Payer: Self-pay

## 2020-03-25 ENCOUNTER — Ambulatory Visit (INDEPENDENT_AMBULATORY_CARE_PROVIDER_SITE_OTHER): Payer: BC Managed Care – PPO | Admitting: Mental Health

## 2020-03-25 DIAGNOSIS — F331 Major depressive disorder, recurrent, moderate: Secondary | ICD-10-CM | POA: Diagnosis not present

## 2020-03-25 NOTE — Progress Notes (Signed)
[facility] Psychotherapy Note  Name: Christian [name] Date: 03/25/2020 MRN: [id number] DOB: 1975-11-07 PCP: Marton Redwood, MD  Time spent: 54 minutes  Treatment: Individual Therapy  Mental Status Exam:   Appearance:   Casual     Behavior:  Appropriate  Motor:  Normal  Speech/Language:   Clear and Coherent    Affect:  Full range  Mood:  Depressed, anxious  Thought process:  normal  Thought content:    WNL  Sensory/Perceptual disturbances:    WNL  Orientation:  x4  Attention:  Good  Concentration:  Good  Memory:  intact  Fund of knowledge:   WNL  Insight:    good  Judgment:   Good  Impulse Control:  Good   Reported Symptoms:  Irritable, depressed, anxiety, anger outbursts (verbal), tearful, feelings of inadequacy  Risk Assessment:  Danger to Self:  No Self-injurious Behavior: No Danger to Others: No Duty to Warn:no Physical Aggression / Violence:No  Access to Firearms a concern: No  Gang Involvement:No  Patient / guardian was educated about steps to take if suicide or homicide risk level increases between visits: yes While future psychiatric events cannot be accurately predicted, the patient does not currently require acute inpatient psychiatric care and does not currently meet [facility] involuntary commitment criteria.  Subjective:  Patient presents for today's session appearing tired.  He stated that he is getting about 6-1/2 hours of sleep per night.  Struggles to get to bed on time, even when trying to go to bed early his wife may enter the room turning on lights to get herself ready for bed while waking him up in the process.  He stated he has had many conversations with her about this and other disagreements, recently relating to the ongoing struggle with her daughter who copes with autism.  He stated that his wife is expressed needing a "break" referring to getting away for about 2 months.  He said he tries to be supportive when she processes feelings  related to the struggle with her daughter however, after this may indoor for several hours or days, he eventually expresses how he feels which would be abandoned and left to parent all her children.  Assisted him in facilitating thoughts and feelings related to what he knows to be true about himself versus what his wife may tell him which is often critical in nature particularly when she is feeling emotionally distressed.  He identified his being good father coming being there for his children, committed to meeting their needs through his emotional support as well as financial.  He continues to express wanting his daughter to get her needs met and has a therapy appointment later today.  He stated they continue to consider out-of-home placement for their daughter and plan to look into possible programs.  Provide support, understanding throughout.  Assisted him in identifying outlets for himself, when to know when he needs to work from the office versus at home to keep his stress manageable and keep his productivity with work intact.  Intervention: CBT, supportive therapy  Diagnoses:  No diagnosis found.   Plan: Patient is to use CBT, mindfulness and coping skills to help manage decrease symptoms associated with their diagnosis.  Patient to follow through with coping skills discussed, diaphragmatic breathing mindfulness exercises.  Patient to continue to use coping skills and communication strategies.   Long-term goal:    Reduce overall level, frequency, and intensity of the feelings of depression, anxiety and panic evidenced by decreased  irritability, negative self talk  from 6 to 7 days/week to 0 to 1 days/week per client report for at least 3 consecutive months.  Patient to follow through with getting enough rest and nutrition as this will assist in helping his mood. He is to also reach out to his wife for support.  Short-term goal:  Verbally express understanding of the relationship between feelings  of depression, anxiety and their impact on thinking patterns and behaviors. Verbalize an understanding of the role that distorted thinking plays in creating fears, excessive worry, and ruminations. Improve effective communication skills to reduce relational stress Decrease irritability frequency and anger outbursts  Progress: Progressing  [place], [facility]

## 2020-04-02 DIAGNOSIS — I10 Essential (primary) hypertension: Secondary | ICD-10-CM | POA: Diagnosis not present

## 2020-04-02 DIAGNOSIS — G629 Polyneuropathy, unspecified: Secondary | ICD-10-CM | POA: Diagnosis not present

## 2020-04-02 DIAGNOSIS — Z1331 Encounter for screening for depression: Secondary | ICD-10-CM | POA: Diagnosis not present

## 2020-04-02 DIAGNOSIS — Z1389 Encounter for screening for other disorder: Secondary | ICD-10-CM | POA: Diagnosis not present

## 2020-04-02 DIAGNOSIS — Z0000 Encounter for general adult medical examination without abnormal findings: Secondary | ICD-10-CM | POA: Diagnosis not present

## 2020-04-06 ENCOUNTER — Ambulatory Visit (INDEPENDENT_AMBULATORY_CARE_PROVIDER_SITE_OTHER): Payer: BC Managed Care – PPO | Admitting: Mental Health

## 2020-04-06 ENCOUNTER — Other Ambulatory Visit: Payer: Self-pay

## 2020-04-06 DIAGNOSIS — F331 Major depressive disorder, recurrent, moderate: Secondary | ICD-10-CM

## 2020-04-06 NOTE — Progress Notes (Signed)
[facility] Psychotherapy Note  Name: Christian [name] Date: 04/06/2020 MRN: [id number] DOB: 07-04-1975 PCP: Christian Clan, MD  Time spent: 54 minutes  Treatment: Individual Therapy  Mental Status Exam:   Appearance:   Casual     Behavior:  Appropriate  Motor:  Normal  Speech/Language:   Clear and Coherent    Affect:  Full range  Mood:  Depressed, anxious  Thought process:  normal  Thought content:    WNL  Sensory/Perceptual disturbances:    WNL  Orientation:  x4  Attention:  Good  Concentration:  Good  Memory:  intact  Fund of knowledge:   WNL  Insight:    good  Judgment:   Good  Impulse Control:  Good   Reported Symptoms:  Irritable, depressed, anxiety, anger outbursts (verbal), tearful, feelings of inadequacy  Risk Assessment:  Danger to Self:  No Self-injurious Behavior: No Danger to Others: No Duty to Warn:no Physical Aggression / Violence:No  Access to Firearms a concern: No  Gang Involvement:No  Patient / guardian was educated about steps to take if suicide or homicide risk level increases between visits: yes While future psychiatric events cannot be accurately predicted, the patient does not currently require acute inpatient psychiatric care and does not currently meet [facility] involuntary commitment criteria.  Subjective:  Patient presents for today's session.  He shared recent events where his daughter went to stay with her paternal grandmother for a couple of days due to patient and his wife needing a break due to her recent behavioral outbursts in the home.  He stated that she continues to get aggressive at times both verbally and physically.  He stated they continue to search for possible out-of-home placement temporarily to assist her in meeting needs while also giving the family respite.  He stated his wife tends to vacillate about these types of decisions, how she has expressed wanting her to engage in a program outside the home, but now,  expresses doubt about following through.  Patient vented feelings related to trying to follow through with making a decision that will be best while also trying also to meet his wife's express needs which he feels change and ultimately leave him responsible where she possibly could blame him later for making the decision.  He stated he wants to make this decision as a couple.  He identified the need to have downtime, time away from the home to ensure that he is following through with work responsibilities where he has been able to do so over the past several weeks.  Ways to continue to cope and care for himself were identified and encouraged to follow through.    Intervention: CBT, supportive therapy  Diagnoses:    ICD-10-CM   1. Major depressive disorder, recurrent episode, moderate (HCC)  F33.1      Plan: Patient is to use CBT, mindfulness and coping skills to help manage decrease symptoms associated with their diagnosis.  Patient to follow through with coping skills discussed, diaphragmatic breathing mindfulness exercises.  Patient to continue to use coping skills and communication strategies.   Long-term goal:    Reduce overall level, frequency, and intensity of the feelings of depression, anxiety and panic evidenced by decreased irritability, negative self talk  from 6 to 7 days/week to 0 to 1 days/week per client report for at least 3 consecutive months.  Patient to follow through with getting enough rest and nutrition as this will assist in helping his mood. He is to also reach  out to his wife for support.  Short-term goal:  Verbally express understanding of the relationship between feelings of depression, anxiety and their impact on thinking patterns and behaviors. Verbalize an understanding of the role that distorted thinking plays in creating fears, excessive worry, and ruminations. Improve effective communication skills to reduce relational stress Decrease irritability frequency and  anger outbursts  Progress: Progressing  [name] Session, [facility]

## 2020-04-23 ENCOUNTER — Other Ambulatory Visit: Payer: Self-pay

## 2020-04-23 ENCOUNTER — Ambulatory Visit (INDEPENDENT_AMBULATORY_CARE_PROVIDER_SITE_OTHER): Payer: BC Managed Care – PPO | Admitting: Mental Health

## 2020-04-23 DIAGNOSIS — F331 Major depressive disorder, recurrent, moderate: Secondary | ICD-10-CM | POA: Diagnosis not present

## 2020-04-23 NOTE — Progress Notes (Signed)
[facility] Psychotherapy Note  Name: Christian [name] Date: 04/23/2020 MRN: [id number] DOB: 30-Jan-1976 PCP: Martha Clan, MD  Time spent: 54 minutes  Treatment: Individual Therapy  Mental Status Exam:   Appearance:   Casual     Behavior:  Appropriate  Motor:  Normal  Speech/Language:   Clear and Coherent    Affect:  Full range  Mood:  Depressed, anxious  Thought process:  normal  Thought content:    WNL  Sensory/Perceptual disturbances:    WNL  Orientation:  x4  Attention:  Good  Concentration:  Good  Memory:  intact  Fund of knowledge:   WNL  Insight:    good  Judgment:   Good  Impulse Control:  Good   Reported Symptoms:  Irritable, depressed, anxiety, anger outbursts (verbal), tearful, feelings of inadequacy  Risk Assessment:  Danger to Self:  No Self-injurious Behavior: No Danger to Others: No Duty to Warn:no Physical Aggression / Violence:No  Access to Firearms a concern: No  Gang Involvement:No  Patient / guardian was educated about steps to take if suicide or homicide risk level increases between visits: yes While future psychiatric events cannot be accurately predicted, the patient does not currently require acute inpatient psychiatric care and does not currently meet [facility] involuntary commitment criteria.  Subjective:  Patient presents for today's session.  He shared recent events where his daughter went to stay with her paternal grandmother for a couple of days due to patient and his wife needing a break due to her recent behavioral outbursts in the home.  He stated that she continues to get aggressive at times both verbally and physically.  He stated they continue to search for possible out-of-home placement temporarily to assist her in meeting needs while also giving the family respite.  He stated his wife tends to vacillate about these types of decisions, how she has expressed wanting her to engage in a program outside the home, but now,  expresses doubt about following through.  Patient vented feelings related to trying to follow through with making a decision that will be best while also trying also to meet his wife's express needs which he feels change and ultimately leave him responsible where she possibly could blame him later for making the decision.  He stated he wants to make this decision as a couple.  He identified the need to have downtime, time away from the home to ensure that he is following through with work responsibilities where he has been able to do so over the past several weeks.  Ways to continue to cope and care for himself were identified and encouraged to follow through.    Intervention: CBT, supportive therapy  Diagnoses:    ICD-10-CM   1. Major depressive disorder, recurrent episode, moderate (HCC)  F33.1      Plan: Patient is to use CBT, mindfulness and coping skills to help manage decrease symptoms associated with their diagnosis.  Patient to follow through with coping skills discussed, diaphragmatic breathing mindfulness exercises.  Patient to continue to use coping skills and communication strategies.   Long-term goal:    Reduce overall level, frequency, and intensity of the feelings of depression, anxiety and panic evidenced by decreased irritability, negative self talk  from 6 to 7 days/week to 0 to 1 days/week per client report for at least 3 consecutive months.  Patient to follow through with getting enough rest and nutrition as this will assist in helping his mood. He is to also reach  out to his wife for support.  Short-term goal:  Verbally express understanding of the relationship between feelings of depression, anxiety and their impact on thinking patterns and behaviors. Verbalize an understanding of the role that distorted thinking plays in creating fears, excessive worry, and ruminations. Improve effective communication skills to reduce relational stress Decrease irritability frequency and  anger outbursts  Progress: Progressing  [name] Session, [facility]

## 2020-05-14 ENCOUNTER — Other Ambulatory Visit: Payer: Self-pay

## 2020-05-14 ENCOUNTER — Ambulatory Visit (INDEPENDENT_AMBULATORY_CARE_PROVIDER_SITE_OTHER): Payer: BC Managed Care – PPO | Admitting: Mental Health

## 2020-05-14 DIAGNOSIS — F331 Major depressive disorder, recurrent, moderate: Secondary | ICD-10-CM | POA: Diagnosis not present

## 2020-05-14 NOTE — Progress Notes (Signed)
[facility] Psychotherapy Note  Name: Christian [name] Date: 05/14/2020 MRN: [id number] DOB: April 24, 1976 PCP: Martha Clan, MD  Time spent: 54 minutes  Treatment: Individual Therapy  Mental Status Exam:   Appearance:   Casual     Behavior:  Appropriate  Motor:  Normal  Speech/Language:   Clear and Coherent    Affect:  Full range  Mood:  Depressed, anxious  Thought process:  normal  Thought content:    WNL  Sensory/Perceptual disturbances:    WNL  Orientation:  x4  Attention:  Good  Concentration:  Good  Memory:  intact  Fund of knowledge:   WNL  Insight:    good  Judgment:   Good  Impulse Control:  Good   Reported Symptoms:  Irritable, depressed, anxiety, anger outbursts (verbal), tearful, feelings of inadequacy  Risk Assessment:  Danger to Self:  No Self-injurious Behavior: No Danger to Others: No Duty to Warn:no Physical Aggression / Violence:No  Access to Firearms a concern: No  Gang Involvement:No  Patient / guardian was educated about steps to take if suicide or homicide risk level increases between visits: yes While future psychiatric events cannot be accurately predicted, the patient does not currently require acute inpatient psychiatric care and does not currently meet [facility] involuntary commitment criteria.  Subjective:  Patient presents for today's session in some distress.   He shared challenges over the past 2 weeks.  He stated that his daughter had significant behavioral outbursts at home where she was aggressive towards her mother and at him at times when attempting to calm her down.  He stated that they took her to the local ER for evaluation but was not admitted to the psychiatric unit.  He stated that they had to call the police out to their house due to her behavior where they were given options which included going back to the ER or petition for involuntary commitment with a local magistrate.  He stated they eventually decided to go  discussed with the magistrate and ultimately she was taken back to the hospital by them where she was held for the last few days waiting for bed availability.  He stated she is to go to [facility] later today for psychiatric admission.  He shared many thoughts and feelings related to the challenges over the past 2 weeks, his wife now wanting her out of the hospital, due to feeling upset about the situation of her being admitted.  Patient shared how he wants her to get the help she needs.  We discussed how she is in a safe place currently, for stabilization and we explore ways to care and cope for himself during this time.  He stated that he plans to go out of town with their sons for a couple of days per his wife's request.  Patient plans to check on her while out of town albeit he would like her to go with them she will not come and insist that he go.  He stated he plans to have a friend of hers also who lives locally to check on her and spend time with her during this time.  Provide support throughout, assisting him and identifying and clarifying needs for himself and family at this time.   He shared recent events where his daughter went to stay with her paternal grandmother for a couple of days due to patient and his wife needing a break due to her recent behavioral outbursts in the home.  He stated that  she continues to get aggressive at times both verbally and physically.  He stated they continue to search for possible out-of-home placement temporarily to assist her in meeting needs while also giving the family respite.  He stated his wife tends to vacillate about these types of decisions, how she has expressed wanting her to engage in a program outside the home, but now, expresses doubt about following through.  Patient vented feelings related to trying to follow through with making a decision that will be best while also trying also to meet his wife's express needs which he feels change and ultimately  leave him responsible where she possibly could blame him later for making the decision.  He stated he wants to make this decision as a couple.  He identified the need to have downtime, time away from the home to ensure that he is following through with work responsibilities where he has been able to do so over the past several weeks.  Ways to continue to cope and care for himself were identified and encouraged to follow through.    Intervention: CBT, supportive therapy  Diagnoses:    ICD-10-CM   1. Major depressive disorder, recurrent episode, moderate (HCC)  F33.1      Plan: Patient is to use CBT, mindfulness and coping skills to help manage decrease symptoms associated with their diagnosis.  Patient to follow through with coping skills discussed, diaphragmatic breathing mindfulness exercises.  Patient to continue to use coping skills and communication strategies.   Long-term goal:    Reduce overall level, frequency, and intensity of the feelings of depression, anxiety and panic evidenced by decreased irritability, negative self talk  from 6 to 7 days/week to 0 to 1 days/week per client report for at least 3 consecutive months.  Patient to follow through with getting enough rest and nutrition as this will assist in helping his mood. He is to also reach out to his wife for support.  Short-term goal:  Verbally express understanding of the relationship between feelings of depression, anxiety and their impact on thinking patterns and behaviors. Verbalize an understanding of the role that distorted thinking plays in creating fears, excessive worry, and ruminations. Improve effective communication skills to reduce relational stress Decrease irritability frequency and anger outbursts  Progress: Progressing  [name] Session, [facility]

## 2020-05-16 ENCOUNTER — Other Ambulatory Visit: Payer: Self-pay | Admitting: Adult Health

## 2020-05-16 DIAGNOSIS — F331 Major depressive disorder, recurrent, moderate: Secondary | ICD-10-CM

## 2020-05-16 DIAGNOSIS — F411 Generalized anxiety disorder: Secondary | ICD-10-CM

## 2020-05-16 DIAGNOSIS — R1900 Intra-abdominal and pelvic swelling, mass and lump, unspecified site: Secondary | ICD-10-CM

## 2020-05-16 HISTORY — DX: Intra-abdominal and pelvic swelling, mass and lump, unspecified site: R19.00

## 2020-05-26 ENCOUNTER — Other Ambulatory Visit: Payer: Self-pay

## 2020-05-26 ENCOUNTER — Ambulatory Visit (INDEPENDENT_AMBULATORY_CARE_PROVIDER_SITE_OTHER): Payer: BC Managed Care – PPO | Admitting: Mental Health

## 2020-05-26 DIAGNOSIS — F331 Major depressive disorder, recurrent, moderate: Secondary | ICD-10-CM

## 2020-05-26 NOTE — Progress Notes (Signed)
[facility] Psychotherapy Note  Name: Christian [name] Date: 05/26/2020 MRN: [id number] DOB: 08-01-1975 PCP: Martha Clan, MD  Time spent: 54 minutes  Treatment: Individual Therapy  Mental Status Exam:   Appearance:   Casual     Behavior:  Appropriate  Motor:  Normal  Speech/Language:   Clear and Coherent    Affect:  Full range  Mood:  Depressed, anxious  Thought process:  normal  Thought content:    WNL  Sensory/Perceptual disturbances:    WNL  Orientation:  x4  Attention:  Good  Concentration:  Good  Memory:  intact  Fund of knowledge:   WNL  Insight:    good  Judgment:   Good  Impulse Control:  Good   Reported Symptoms:  Irritable, depressed, anxiety, anger outbursts (verbal), tearful, feelings of inadequacy  Risk Assessment:  Danger to Self:  No Self-injurious Behavior: No Danger to Others: No Duty to Warn:no Physical Aggression / Violence:No  Access to Firearms a concern: No  Gang Involvement:No  Patient / guardian was educated about steps to take if suicide or homicide risk level increases between visits: yes While future psychiatric events cannot be accurately predicted, the patient does not currently require acute inpatient psychiatric care and does not currently meet [facility] involuntary commitment criteria.  Subjective:  Patient presents for today's session.  He shared how he and his wife picked up their daughter from inpatient at [facility] last Thursday after being inpatient psychiatrically for about 1 week.  He stated that she has transitioned well but, overall, has a tendency to over eat, that it is a clinical condition that she has struggled with before.  He said they are in the process of identifying and setting up a psychiatric appointment for continued medication management for their daughter.  He shared how his wife, who he feels copes with OCD as well as his older teenage son, has been having increased stress due to some of his daughter's  behaviors related to excessive eating recently.  He shared how he tries to be supportive as well as work towards solutions due to the ongoing challenges related to their daughter's behavioral issues.  He stated there has been less defiance at home with her and they plan to continue to have her engage in therapy.  He went on to share feelings related to the continued strain in the marital relationship with his wife, feeling blamed often by his wife particularly if there is a significant life decision to be made.  He said that she has expressed wanting to move and he has come up with several different options however, he has some trepidation due to ultimately feeling responsible solely if his wife eventually disagrees with decisions that have been made.  Provided support throughout, assisting him in identifying ways to cope and care for himself where he plans to follow through with more communication with his wife to work on their relationship.  Reviewed coping such as thought stopping situations where he has to refocus his mind on work and other tasks to be effective.  Intervention: CBT, supportive therapy  Diagnoses:    ICD-10-CM   1. Major depressive disorder, recurrent episode, moderate (HCC)  F33.1      Plan: Patient is to use CBT, mindfulness and coping skills to help manage decrease symptoms associated with their diagnosis.  Patient to follow through with coping skills discussed, diaphragmatic breathing mindfulness exercises.  Patient to continue to use coping skills and communication strategies.   Long-term  goal:    Reduce overall level, frequency, and intensity of the feelings of depression, anxiety and panic evidenced by decreased irritability, negative self talk  from 6 to 7 days/week to 0 to 1 days/week per client report for at least 3 consecutive months.  Patient to follow through with getting enough rest and nutrition as this will assist in helping his mood. He is to also reach out to his  wife for support.  Short-term goal:  Verbally express understanding of the relationship between feelings of depression, anxiety and their impact on thinking patterns and behaviors. Verbalize an understanding of the role that distorted thinking plays in creating fears, excessive worry, and ruminations. Improve effective communication skills to reduce relational stress Decrease irritability frequency and anger outbursts  Progress: Progressing  [name] Session, [facility]

## 2020-06-01 ENCOUNTER — Other Ambulatory Visit: Payer: Self-pay | Admitting: Adult Health

## 2020-06-01 DIAGNOSIS — F331 Major depressive disorder, recurrent, moderate: Secondary | ICD-10-CM

## 2020-06-01 DIAGNOSIS — F411 Generalized anxiety disorder: Secondary | ICD-10-CM

## 2020-06-09 ENCOUNTER — Ambulatory Visit (INDEPENDENT_AMBULATORY_CARE_PROVIDER_SITE_OTHER): Payer: BC Managed Care – PPO | Admitting: Mental Health

## 2020-06-09 ENCOUNTER — Other Ambulatory Visit: Payer: Self-pay

## 2020-06-09 DIAGNOSIS — F331 Major depressive disorder, recurrent, moderate: Secondary | ICD-10-CM | POA: Diagnosis not present

## 2020-06-09 NOTE — Progress Notes (Signed)
[facility] Psychotherapy Note  Name: Christian [name] Date: 06/09/2020 MRN: [id number] DOB: [birth year] PCP: Martha Clan, MD  Time spent: 54 minutes  Treatment: Individual Therapy  Mental Status Exam:   Appearance:   Casual     Behavior:  Appropriate  Motor:  Normal  Speech/Language:   Clear and Coherent    Affect:  Full range  Mood:  Depressed, anxious  Thought process:  normal  Thought content:    WNL  Sensory/Perceptual disturbances:    WNL  Orientation:  x4  Attention:  Good  Concentration:  Good  Memory:  intact  Fund of knowledge:   WNL  Insight:    good  Judgment:   Good  Impulse Control:  Good   Reported Symptoms:  Irritable, depressed, anxiety, anger outbursts (verbal), tearful, feelings of inadequacy  Risk Assessment:  Danger to Self:  No Self-injurious Behavior: No Danger to Others: No Duty to Warn:no Physical Aggression / Violence:No  Access to Firearms a concern: No  Gang Involvement:No  Patient / guardian was educated about steps to take if suicide or homicide risk level increases between visits: yes While future psychiatric events cannot be accurately predicted, the patient does not currently require acute inpatient psychiatric care and does not currently meet [facility] involuntary commitment criteria.  Subjective:  Patient presents for today's session.  Patient shared recent progress and events.  He stated he and his wife continue to have marital strain, occurring over the last 2 to 3 days.  He stated that the recent differences centered primarily around they are agreeing on whether to relocate to another city.  Patient went on to process thoughts and feelings regarding his being in this area for many years, albeit not totally against moving but unsure at this point.  He shared how his wife is trying to pack up the house and has been critical of him for not being more helpful.  Patient stated that due to his being unsure about the move itself  or even where they are going potentially, his left him not motivated to take some additional steps.  He stated that she brought up getting divorced again recently and stated she plans to make attempts at talking to an attorney.  Patient processed feelings related to his wife going back to discussing divorce while also just a handful days previous verbalizing plans for them to be together and relocate.  Patient identified as planned to be mindful of not engaging in arguments with his wife, he shared how he has expressed that he loves her and he wants to work on their relationship while also sharing today how he is fatigued from having some of the same arguments.  I continue to not engage in couples counseling as this was discussed in previous sessions.  Intervention: CBT, supportive therapy  Diagnoses:    ICD-10-CM   1. Major depressive disorder, recurrent episode, moderate (HCC)  F33.1      Plan: Patient is to use CBT, mindfulness and coping skills to help manage decrease symptoms associated with their diagnosis.  Patient to follow through with coping skills discussed, diaphragmatic breathing mindfulness exercises.  Patient to continue to use coping skills and communication strategies.   Long-term goal:    Reduce overall level, frequency, and intensity of the feelings of depression, anxiety and panic evidenced by decreased irritability, negative self talk  from 6 to 7 days/week to 0 to 1 days/week per client report for at least 3 consecutive months.  Patient to follow  through with getting enough rest and nutrition as this will assist in helping his mood. He is to also reach out to his wife for support.  Short-term goal:  Verbally express understanding of the relationship between feelings of depression, anxiety and their impact on thinking patterns and behaviors. Verbalize an understanding of the role that distorted thinking plays in creating fears, excessive worry, and ruminations. Improve  effective communication skills to reduce relational stress Decrease irritability frequency and anger outbursts  Progress: Progressing  [name] Session, [facility]

## 2020-06-23 ENCOUNTER — Ambulatory Visit: Payer: BC Managed Care – PPO | Admitting: Mental Health

## 2020-06-30 ENCOUNTER — Ambulatory Visit (INDEPENDENT_AMBULATORY_CARE_PROVIDER_SITE_OTHER): Payer: BC Managed Care – PPO | Admitting: Mental Health

## 2020-06-30 ENCOUNTER — Other Ambulatory Visit: Payer: Self-pay

## 2020-06-30 DIAGNOSIS — F331 Major depressive disorder, recurrent, moderate: Secondary | ICD-10-CM

## 2020-06-30 NOTE — Progress Notes (Signed)
[facility] Psychotherapy Note  Name: NAMEER SUMMER Date: 06/30/2020 MRN: [id number] DOB: Apr 23, 1976 PCP: Martha Clan, MD  Time spent: 53 minutes  Treatment: Individual Therapy  Mental Status Exam:   Appearance:   Casual     Behavior:  Appropriate  Motor:  Normal  Speech/Language:   Clear and Coherent    Affect:  Full range  Mood:  Depressed, anxious  Thought process:  normal  Thought content:    WNL  Sensory/Perceptual disturbances:    WNL  Orientation:  x4  Attention:  Good  Concentration:  Good  Memory:  intact  Fund of knowledge:   WNL  Insight:    good  Judgment:   Good  Impulse Control:  Good   Reported Symptoms:  Irritable, depressed, anxiety, anger outbursts (verbal), tearful, feelings of inadequacy  Risk Assessment:  Danger to Self:  No Self-injurious Behavior: No Danger to Others: No Duty to Warn:no Physical Aggression / Violence:No  Access to Firearms a concern: No  Gang Involvement:No  Patient / guardian was educated about steps to take if suicide or homicide risk level increases between visits: yes While future psychiatric events cannot be accurately predicted, the patient does not currently require acute inpatient psychiatric care and does not currently meet [facility] involuntary commitment criteria.  Subjective:  Patient presents for today's session.  Patient shared recent progress and events. Had a difficult morning due to his daughter acting out this morning as he was trying to get her to school on time. Patient stated he and his wife have had more changes. He stated that she has abstained from any cannabis use over the past 3 weeks and has attended some support groups. He shared how she I went out of town with family to return to him being at home over the weekend and his admittedly not falling through with some tasks to continue the process of packing away items in their house for potentially selling in the next month or two. Through  guide discovery, he identified fearful of an engaging in this process due to what potentially may I mean besides just selling their house and they're moving, such as their separating and other changes. He shared also the struggle he has with binge eating, how this has been present for many years. Assistant have an exploring and identifying thoughts and feelings associated. Identified using food such as fast food takeout, eating and excess even when he starts to feel full and experience some pain. Provide him resource to complete between sessions to further understand and identify triggers.   Intervention: CBT, supportive therapy  Diagnoses:    ICD-10-CM   1. Major depressive disorder, recurrent episode, moderate (HCC)  F33.1      Plan: Patient is to use CBT, mindfulness and coping skills to help manage decrease symptoms associated with their diagnosis.  Patient to follow through with coping skills discussed, diaphragmatic breathing mindfulness exercises.  Patient to continue complete homework.    Long-term goal:    Reduce overall level, frequency, and intensity of the feelings of depression, anxiety and panic evidenced by decreased irritability, negative self talk  from 6 to 7 days/week to 0 to 1 days/week per client report for at least 3 consecutive months.  Patient to follow through with getting enough rest and nutrition as this will assist in helping his mood. He is to also reach out to his wife for support.  Short-term goal:  Verbally express understanding of the relationship between feelings of depression, anxiety  and their impact on thinking patterns and behaviors. Verbalize an understanding of the role that distorted thinking plays in creating fears, excessive worry, and ruminations. Improve effective communication skills to reduce relational stress Decrease irritability frequency and anger outbursts  Progress: Progressing  [name] Session, [facility]

## 2020-07-07 ENCOUNTER — Ambulatory Visit: Payer: BC Managed Care – PPO | Admitting: Mental Health

## 2020-07-14 ENCOUNTER — Other Ambulatory Visit: Payer: Self-pay

## 2020-07-14 ENCOUNTER — Ambulatory Visit (INDEPENDENT_AMBULATORY_CARE_PROVIDER_SITE_OTHER): Payer: BC Managed Care – PPO | Admitting: Mental Health

## 2020-07-14 DIAGNOSIS — F331 Major depressive disorder, recurrent, moderate: Secondary | ICD-10-CM | POA: Diagnosis not present

## 2020-07-14 NOTE — Progress Notes (Signed)
[facility] Psychotherapy Note  Name: LAQUAN BEIER Date: 07/14/20 MRN: [id number] DOB: 1975-12-18 PCP: Cleatis Polka., MD  Time spent: 54 minutes  Treatment: Individual Therapy  Mental Status Exam:   Appearance:   Casual     Behavior:  Appropriate  Motor:  Normal  Speech/Language:   Clear and Coherent    Affect:  Full range  Mood:  Depressed, anxious  Thought process:  normal  Thought content:    WNL  Sensory/Perceptual disturbances:    WNL  Orientation:  x4  Attention:  Good  Concentration:  Good  Memory:  intact  Fund of knowledge:   WNL  Insight:    good  Judgment:   Good  Impulse Control:  Good   Reported Symptoms:  Irritable, depressed, anxiety, anger outbursts (verbal), tearful, feelings of inadequacy  Risk Assessment:  Danger to Self:  No Self-injurious Behavior: No Danger to Others: No Duty to Warn:no Physical Aggression / Violence:No  Access to Firearms a concern: No  Gang Involvement:No  Patient / guardian was educated about steps to take if suicide or homicide risk level increases between visits: yes While future psychiatric events cannot be accurately predicted, the patient does not currently require acute inpatient psychiatric care and does not currently meet [facility] involuntary commitment criteria.  Subjective:  Patient presents for today's session.  Patient shared continued family stressors.  He shared how he and his wife continue to have marital strain as they have disagreed on some aspects of their potentially moving from their home.  He stated that they continue to go forward with packing the house and getting it ready for sale.  He shared how he has not taken his medication recently and feels that he has been more agitated sharing some experiences at his daughter's volleyball game where he felt that some of her teammates were talking down to her in other players.  This frustrated patient as he stated the coach does not intervene  however, patient also recognizes how upset he felt in the situations which is different since discontinuing his medication.  At this point, he does not want to return to taking his medication as he felt it also impacted his not emotionally engaging enough in some experiences "I felt like I did not care about anything".  We encouraged him to follow-up with talking to his prescribing provider with any questions or concerns.  Facilitated his identifying ways he could have handled some of the situations mentioned where he felt that he reacted with more anger than he would have liked initially.   Intervention: CBT, supportive therapy  Diagnoses:    ICD-10-CM   1. Major depressive disorder, recurrent episode, moderate (HCC)  F33.1      Plan: Patient is to use CBT, mindfulness and coping skills to help manage decrease symptoms associated with their diagnosis.  Patient to follow through with coping skills discussed, diaphragmatic breathing mindfulness exercises.  Patient to continue complete homework.    Long-term goal:    Reduce overall level, frequency, and intensity of the feelings of depression, anxiety and panic evidenced by decreased irritability, negative self talk  from 6 to 7 days/week to 0 to 1 days/week per client report for at least 3 consecutive months.  Patient to follow through with getting enough rest and nutrition as this will assist in helping his mood. He is to also reach out to his wife for support.  Short-term goal:  Verbally express understanding of the relationship between feelings of  depression, anxiety and their impact on thinking patterns and behaviors. Verbalize an understanding of the role that distorted thinking plays in creating fears, excessive worry, and ruminations. Improve effective communication skills to reduce relational stress Decrease irritability frequency and anger outbursts  Progress: Progressing  [name] Session, [facility]

## 2020-07-28 ENCOUNTER — Other Ambulatory Visit: Payer: Self-pay

## 2020-07-28 ENCOUNTER — Ambulatory Visit (INDEPENDENT_AMBULATORY_CARE_PROVIDER_SITE_OTHER): Payer: BC Managed Care – PPO | Admitting: Mental Health

## 2020-07-28 DIAGNOSIS — F331 Major depressive disorder, recurrent, moderate: Secondary | ICD-10-CM

## 2020-07-28 NOTE — Progress Notes (Signed)
[facility] Psychotherapy Note  Name: Christian [name] Date: 07/28/2020 MRN: [id number] DOB: 07-21-1975 PCP: Cleatis Polka., MD  Time spent: 53 minutes  Treatment: Individual Therapy  Mental Status Exam:   Appearance:   Casual     Behavior:  Appropriate  Motor:  Normal  Speech/Language:   Clear and Coherent    Affect:  Full range  Mood:  Depressed, anxious  Thought process:  normal  Thought content:    WNL  Sensory/Perceptual disturbances:    WNL  Orientation:  x4  Attention:  Good  Concentration:  Good  Memory:  intact  Fund of knowledge:   WNL  Insight:    good  Judgment:   Good  Impulse Control:  Good   Reported Symptoms:  Irritable, depressed, anxiety, anger outbursts (verbal), tearful, feelings of inadequacy  Risk Assessment:  Danger to Self:  No Self-injurious Behavior: No Danger to Others: No Duty to Warn:no Physical Aggression / Violence:No  Access to Firearms a concern: No  Gang Involvement:No  Patient / guardian was educated about steps to take if suicide or homicide risk level increases between visits: yes While future psychiatric events cannot be accurately predicted, the patient does not currently require acute inpatient psychiatric care and does not currently meet [facility] involuntary commitment criteria.  Subjective:  Patient presents for today's session.  Patient shared recent progress and changes.  He shared how he continues to have a decreased frustration tolerance, sharing how he got upset recently with his daughter due to her engaging in a discussion that was between him and his wife.  He stated that he and his wife were arguing due to her being critical of his not taking care of more tasks around the house.  He shared how he takes care of most of the tasks in the house as well as providing transportation for their children.  He stated that he does 90% of the cooking, cooks the food away after dinner as well as cleans up dishes alone.   He stated that he is the primary parent who will ensure that they are taken to school, athletic events all while being the only spouse working full-time.  He stated his wife was working part-time but not recently. he stated his wife wants him to start back on medication but he does not want to at this time due to feeling "zoned out" while taking medication previously.  We discussed the possibility of seeing a provider to further discuss options.  He identified feelings of frustration in his marital relationship, sharing more history related to how he is committed to being an active father, for filling needs in the household such as his mother did consistently unlike his father who primarily with focus on work only.  Through guided discovery he identified wanting to change his getting as upset and is often and we explore collaboratively some ways to communicate in the relationship and how, with increasing some mindfulness prior to situations, he may feel he is handling some of the situations that can escalate more effectively.   Intervention: CBT, supportive therapy   Diagnoses:    ICD-10-CM   1. Major depressive disorder, recurrent episode, moderate (HCC)  F33.1      Plan: Patient is to use CBT, mindfulness and coping skills to help manage decrease symptoms associated with their diagnosis.  Patient to follow through with coping skills discussed, diaphragmatic breathing mindfulness exercises.  Patient to follow through with allowing himself to take moments to prepare  himself for situations and how he would like to respond.   Long-term goal:    Reduce overall level, frequency, and intensity of the feelings of depression, anxiety and panic evidenced by decreased irritability, negative self talk  from 6 to 7 days/week to 0 to 1 days/week per client report for at least 3 consecutive months.  Patient to follow through with getting enough rest and nutrition as this will assist in helping his mood. He is to  also reach out to his wife for support.  Short-term goal:  Verbally express understanding of the relationship between feelings of depression, anxiety and their impact on thinking patterns and behaviors. Verbalize an understanding of the role that distorted thinking plays in creating fears, excessive worry, and ruminations. Improve effective communication skills to reduce relational stress Decrease irritability frequency and anger outbursts  Progress: Progressing  [name] Session, [facility]

## 2020-08-03 DIAGNOSIS — F419 Anxiety disorder, unspecified: Secondary | ICD-10-CM | POA: Diagnosis not present

## 2020-08-03 DIAGNOSIS — G43709 Chronic migraine without aura, not intractable, without status migrainosus: Secondary | ICD-10-CM | POA: Diagnosis not present

## 2020-08-03 DIAGNOSIS — Z6841 Body Mass Index (BMI) 40.0 and over, adult: Secondary | ICD-10-CM | POA: Diagnosis not present

## 2020-08-06 DIAGNOSIS — J069 Acute upper respiratory infection, unspecified: Secondary | ICD-10-CM | POA: Diagnosis not present

## 2020-08-06 DIAGNOSIS — J029 Acute pharyngitis, unspecified: Secondary | ICD-10-CM | POA: Diagnosis not present

## 2020-08-06 DIAGNOSIS — R059 Cough, unspecified: Secondary | ICD-10-CM | POA: Diagnosis not present

## 2020-08-11 ENCOUNTER — Other Ambulatory Visit: Payer: Self-pay

## 2020-08-11 ENCOUNTER — Ambulatory Visit (INDEPENDENT_AMBULATORY_CARE_PROVIDER_SITE_OTHER): Payer: BC Managed Care – PPO | Admitting: Mental Health

## 2020-08-11 DIAGNOSIS — F331 Major depressive disorder, recurrent, moderate: Secondary | ICD-10-CM | POA: Diagnosis not present

## 2020-08-11 NOTE — Progress Notes (Signed)
[facility] Psychotherapy Note  Name: Christian [name] Date: 08/11/2020 MRN: [id number] DOB: 1975-05-26 PCP: Cleatis Polka., MD  Time spent: 53 minutes  Treatment: Individual Therapy  Mental Status Exam:   Appearance:   Casual     Behavior:  Appropriate  Motor:  Normal  Speech/Language:   Clear and Coherent    Affect:  Full range  Mood:  Depressed, anxious  Thought process:  normal  Thought content:    WNL  Sensory/Perceptual disturbances:    WNL  Orientation:  x4  Attention:  Good  Concentration:  Good  Memory:  intact  Fund of knowledge:   WNL  Insight:    good  Judgment:   Good  Impulse Control:  Good   Reported Symptoms:  Irritable, depressed, anxiety, anger outbursts (verbal), tearful, feelings of inadequacy  Risk Assessment:  Danger to Self:  No Self-injurious Behavior: No Danger to Others: No Duty to Warn:no Physical Aggression / Violence:No  Access to Firearms a concern: No  Gang Involvement:No  Patient / guardian was educated about steps to take if suicide or homicide risk level increases between visits: yes While future psychiatric events cannot be accurately predicted, the patient does not currently require acute inpatient psychiatric care and does not currently meet [facility] involuntary commitment criteria.  Subjective:  Patient presents for today's session sharing recent events.  He stated that he and his wife plan to go on a trip together and that is related to his work and then the following week spend time as a family over spring break.  He went on to share how his son has obtained an academic scholarship due to his high grades however, he expresses worry and concerned about his daughter.  He stated that she has the [facility] providing in-home services recently and they will last for about 3 months.  He identified sadness and anxiety related to the concerns about her future due to her limitations functionally.  He stated she  struggles with daily hygiene and other related adaptive living skills.  Provide support as he processed feelings related.  He shared how he feels that he and his wife need to have a more honest and open discussion about the concerns that he has about their daughter.  He feels they are avoiding this discussion.  He identified this is a need and plans to follow through between sessions.   Intervention: supportive therapy   Diagnoses:    ICD-10-CM   1. Major depressive disorder, recurrent episode, moderate (HCC)  F33.1      Plan: Patient is to use CBT, mindfulness and coping skills to help manage decrease symptoms associated with their diagnosis.  Patient to follow through with coping skills discussed, diaphragmatic breathing mindfulness exercises.  Patient to follow through with plan discussion with his wife regarding their daughter.   Long-term goal:    Reduce overall level, frequency, and intensity of the feelings of depression, anxiety and panic evidenced by decreased irritability, negative self talk  from 6 to 7 days/week to 0 to 1 days/week per client report for at least 3 consecutive months.  Patient to follow through with getting enough rest and nutrition as this will assist in helping his mood. He is to also reach out to his wife for support.  Short-term goal:  Verbally express understanding of the relationship between feelings of depression, anxiety and their impact on thinking patterns and behaviors. Verbalize an understanding of the role that distorted thinking plays in creating  fears, excessive worry, and ruminations. Improve effective communication skills to reduce relational stress Decrease irritability frequency and anger outbursts  Progress: Progressing  [name] Session, [facility]

## 2020-08-25 ENCOUNTER — Other Ambulatory Visit: Payer: Self-pay

## 2020-08-25 ENCOUNTER — Ambulatory Visit (INDEPENDENT_AMBULATORY_CARE_PROVIDER_SITE_OTHER): Payer: BC Managed Care – PPO | Admitting: Mental Health

## 2020-08-25 DIAGNOSIS — F331 Major depressive disorder, recurrent, moderate: Secondary | ICD-10-CM | POA: Diagnosis not present

## 2020-08-25 NOTE — Progress Notes (Signed)
[facility] Psychotherapy Note  Name: Christian [name] Date: 08/25/2020 MRN: [id number] DOB: 06/03/1975 PCP: Cleatis Polka., MD  Time spent: 53 minutes  Treatment: Individual Therapy  Mental Status Exam:   Appearance:   Casual     Behavior:  Appropriate  Motor:  Normal  Speech/Language:   Clear and Coherent    Affect:  Full range  Mood:  Depressed, anxious  Thought process:  normal  Thought content:    WNL  Sensory/Perceptual disturbances:    WNL  Orientation:  x4  Attention:  Good  Concentration:  Good  Memory:  intact  Fund of knowledge:   WNL  Insight:    good  Judgment:   Good  Impulse Control:  Good   Reported Symptoms:  Irritable, depressed, anxiety, anger outbursts (verbal), tearful, feelings of inadequacy  Risk Assessment:  Danger to Self:  No Self-injurious Behavior: No Danger to Others: No Duty to Warn:no Physical Aggression / Violence:No  Access to Firearms a concern: No  Gang Involvement:No  Patient / guardian was educated about steps to take if suicide or homicide risk level increases between visits: yes While future psychiatric events cannot be accurately predicted, the patient does not currently require acute inpatient psychiatric care and does not currently meet [facility] involuntary commitment criteria.  Subjective:  Patient presents for today's session sharing recent events.  He stated the recent business trip was "interesting" that he and his wife shared.  He went on to share how she was often disconnected socially throughout the trip as this trip was business focused and he would have liked her to be more engaging which typically is something that comes easy for her per his report.  He stated that she recently mentioned wanting to adopt a child.  He stated the disagree, he stated that they have the children and at their age he does not feel this would be a good decision.  He went on to share how other factors are they are having marital  issues and other family challenges.  He stated that yesterday he vented feelings related to his wife about how he feels that he is not a priority in her life, how she tends to focus on her needs and wants.  Patient identified how he wants to make more of an effort on engaging in interests of his own and making time for himself as a way of self-care.  He stated that at this point, he is not wanting to continue the marriage and plans to talk to an attorney to explore options.   Intervention: supportive therapy   Diagnoses:    ICD-10-CM   1. Major depressive disorder, recurrent episode, moderate (HCC)  F33.1      Plan: Patient is to use CBT, mindfulness and coping skills to help manage decrease symptoms associated with their diagnosis.  Patient to follow through with coping skills discussed.  Patient to follow through with engaging in some pleasurable interests for himself, making time to de- stress.   Long-term goal:    Reduce overall level, frequency, and intensity of the feelings of depression, anxiety and panic evidenced by decreased irritability, negative self talk  from 6 to 7 days/week to 0 to 1 days/week per client report for at least 3 consecutive months.    Short-term goal:  Verbally express understanding of the relationship between feelings of depression, anxiety and their impact on thinking patterns and behaviors. Verbalize an understanding of the role that distorted thinking plays in creating  fears, excessive worry, and ruminations. Improve effective communication skills to reduce relational stress Decrease irritability frequency and anger outbursts  Progress: Progressing  [name] Session, [facility]

## 2020-09-08 ENCOUNTER — Other Ambulatory Visit: Payer: Self-pay

## 2020-09-08 ENCOUNTER — Ambulatory Visit: Payer: BC Managed Care – PPO | Admitting: Mental Health

## 2020-09-08 ENCOUNTER — Ambulatory Visit (INDEPENDENT_AMBULATORY_CARE_PROVIDER_SITE_OTHER): Payer: BC Managed Care – PPO | Admitting: Mental Health

## 2020-09-08 DIAGNOSIS — F331 Major depressive disorder, recurrent, moderate: Secondary | ICD-10-CM

## 2020-09-08 NOTE — Progress Notes (Signed)
[facility] Psychotherapy Note  Name: Christian [name] Date: 09/08/2020 MRN: [id number] DOB: [birth year] PCP: Ginger Organ., MD  Time spent: 55 minutes  Treatment: Individual Therapy  Mental Status Exam:   Appearance:   Casual     Behavior:  Appropriate  Motor:  Normal  Speech/Language:   Clear and Coherent    Affect:  Full range  Mood:  Depressed, anxious  Thought process:  normal  Thought content:    WNL  Sensory/Perceptual disturbances:    WNL  Orientation:  x4  Attention:  Good  Concentration:  Good  Memory:  intact  Fund of knowledge:   WNL  Insight:    good  Judgment:   Good  Impulse Control:  Good   Reported Symptoms:  Irritable, depressed, anxiety, anger outbursts (verbal), tearful, feelings of inadequacy  Risk Assessment:  Danger to Self:  No Self-injurious Behavior: No Danger to Others: No Duty to Warn:no Physical Aggression / Violence:No  Access to Firearms a concern: No  Gang Involvement:No  Patient / guardian was educated about steps to take if suicide or homicide risk level increases between visits: yes While future psychiatric events cannot be accurately predicted, the patient does not currently require acute inpatient psychiatric care and does not currently meet [facility] involuntary commitment criteria.  Subjective:  Patient presents for today's session sharing recent events.  He stated that he and his wife and children went on a recent vacation over spring break.  He stated that he had a work contract come through with which he had to spend time on during the vacation which upset his wife.  He went on to share how she accuses him of "choosing work over [place]".  Patient stated that he wants to spend time with his family and does so sharing the many efforts he goes to to spend time with his children, ensure that they get to activities, go on vacations, ensuring that they have needs met on a daily basis.  He stated they had some recent  arguments on the vacation and discussion of divorce continues to surface.  He stated that he is questioning whether he needs to stay in the relationship at this point.  He shared how his wife copes with some obsessive compulsive tendencies to the point where she gets so upset and agitated that he cannot communicate with her due to her refusing to listen.  He went on to share some examples, how this is occurred for many years in their marriage.  He shared how work has improved recently he has been more able to reach his productivity goals which has reduced some financial stress.  Facilitated his identifying thoughts, feelings related to needs toward self-care while providing support throughout.  Intervention: supportive therapy   Diagnoses:    ICD-10-CM   1. Major depressive disorder, recurrent episode, moderate ([place])  F33.1      Plan: Patient is to use CBT, mindfulness and coping skills to help manage decrease symptoms associated with their diagnosis.  Patient to follow through with coping skills discussed.  Patient to follow through with engaging in some pleasurable interests for himself, making time to de- stress.   Long-term goal:    Reduce overall level, frequency, and intensity of the feelings of depression, anxiety and panic evidenced by decreased irritability, negative self talk  from 6 to 7 days/week to 0 to 1 days/week per client report for at least 3 consecutive months.    Short-term goal:  Verbally express understanding  of the relationship between feelings of depression, anxiety and their impact on thinking patterns and behaviors. Verbalize an understanding of the role that distorted thinking plays in creating fears, excessive worry, and ruminations. Improve effective communication skills to reduce relational stress Decrease irritability frequency and anger outbursts  Progress: Progressing  [place], [facility]

## 2020-09-22 ENCOUNTER — Other Ambulatory Visit: Payer: Self-pay

## 2020-09-22 ENCOUNTER — Ambulatory Visit (INDEPENDENT_AMBULATORY_CARE_PROVIDER_SITE_OTHER): Payer: BC Managed Care – PPO | Admitting: Mental Health

## 2020-09-22 DIAGNOSIS — F331 Major depressive disorder, recurrent, moderate: Secondary | ICD-10-CM | POA: Diagnosis not present

## 2020-09-22 NOTE — Progress Notes (Signed)
[facility] Psychotherapy Note  Name: Christian [name] Date: 09/22/2020 MRN: [id number] DOB: 1975-06-16 PCP: Cleatis Polka., MD  Time spent: 57 minutes  Treatment: Individual Therapy  Mental Status Exam:   Appearance:   Casual     Behavior:  Appropriate  Motor:  Normal  Speech/Language:   Clear and Coherent    Affect:  Full range  Mood:  Depressed, pleasant  Thought process:  normal  Thought content:    WNL  Sensory/Perceptual disturbances:    WNL  Orientation:  x4  Attention:  Good  Concentration:  Good  Memory:  intact  Fund of knowledge:   WNL  Insight:    good  Judgment:   Good  Impulse Control:  Good   Reported Symptoms:  Irritable, depressed, anxiety, anger outbursts (verbal), tearful, feelings of inadequacy  Risk Assessment:  Danger to Self:  No Self-injurious Behavior: No Danger to Others: No Duty to Warn:no Physical Aggression / Violence:No  Access to Firearms a concern: No  Gang Involvement:No  Patient / guardian was educated about steps to take if suicide or homicide risk level increases between visits: yes While future psychiatric events cannot be accurately predicted, the patient does not currently require acute inpatient psychiatric care and does not currently meet [facility] involuntary commitment criteria.  Subjective:  Patient presents for today's session sharing recent events.  He shared how he and his wife continue to have strain in their relationship.  He shared how some recent family issues with 2 of their children have made day-to-day stress increase more recently.  He stated his daughter, who struggles with autism, has had an increase in some aggression and defiance at home.  He stated they have had her attend a recent doctor appointment and hopes that a medication change will assist.  He stated his wife continues to raise her voice, complained to him when one of their children misbehaves.  He stated this was also evident when his  oldest son who is to graduate high school soon, came home intoxicated.  Through guided discovery, he identified feelings of uncertainty about the future and his marital relationship, speculating that when his oldest son leaves home for college, that his wife might decide to leave the marriage.  He stated she continues to make comments from time to time about wanting the marriage to end, particularly when upset where he states it may take her a while to calm down.  He identified the need to set boundaries in some ways with his wife when she raises her voice at him in an effort for him to manage situations that may occur with her children was explored collaboratively.  He has not been engaging in pleasurable interests for himself and plans to further take steps in this area between sessions, also including his wife as he wants them to share some experiences together that are positive.  Intervention: supportive therapy   Diagnoses:    ICD-10-CM   1. Major depressive disorder, recurrent episode, moderate (HCC)  F33.1      Plan: Patient is to use CBT, mindfulness and coping skills to help manage decrease symptoms associated with their diagnosis.  Patient to follow through with coping skills discussed.  Patient to follow through with engaging in some pleasurable interests for himself, making time to de- stress.   Long-term goal:    Reduce overall level, frequency, and intensity of the feelings of depression, anxiety and panic evidenced by decreased irritability, negative self talk  from 6  to 7 days/week to 0 to 1 days/week per client report for at least 3 consecutive months.    Short-term goal:  Verbally express understanding of the relationship between feelings of depression, anxiety and their impact on thinking patterns and behaviors. Verbalize an understanding of the role that distorted thinking plays in creating fears, excessive worry, and ruminations. Improve effective communication skills to  reduce relational stress Decrease irritability frequency and anger outbursts  Progress: Progressing  [name] Session, [facility]

## 2020-10-06 ENCOUNTER — Other Ambulatory Visit: Payer: Self-pay

## 2020-10-06 ENCOUNTER — Ambulatory Visit (INDEPENDENT_AMBULATORY_CARE_PROVIDER_SITE_OTHER): Payer: BC Managed Care – PPO | Admitting: Mental Health

## 2020-10-06 DIAGNOSIS — F331 Major depressive disorder, recurrent, moderate: Secondary | ICD-10-CM | POA: Diagnosis not present

## 2020-10-06 NOTE — Progress Notes (Signed)
[facility] Psychotherapy Note  Name: Christian [name] Date: 10/06/2020 MRN: [id number] DOB: 1975/05/19 PCP: Cleatis Polka., MD  Time spent: 53 minutes  Treatment: Individual Therapy  Mental Status Exam:   Appearance:   Casual     Behavior:  Appropriate  Motor:  Normal  Speech/Language:   Clear and Coherent    Affect:  Full range  Mood:  Depressed, pleasant  Thought process:  normal  Thought content:    WNL  Sensory/Perceptual disturbances:    WNL  Orientation:  x4  Attention:  Good  Concentration:  Good  Memory:  intact  Fund of knowledge:   WNL  Insight:    good  Judgment:   Good  Impulse Control:  Good   Reported Symptoms:  Irritable, depressed, anxiety, anger outbursts (verbal), tearful, feelings of inadequacy  Risk Assessment:  Danger to Self:  No Self-injurious Behavior: No Danger to Others: No Duty to Warn:no Physical Aggression / Violence:No  Access to Firearms a concern: No  Gang Involvement:No  Patient / guardian was educated about steps to take if suicide or homicide risk level increases between visits: yes While future psychiatric events cannot be accurately predicted, the patient does not currently require acute inpatient psychiatric care and does not currently meet [facility] involuntary commitment criteria.  Subjective:  Patient presents for today's session progress, recent events.  He shared the ongoing marital and family relational strain with his family.  He stated his oldest son, age 54, verbalized recently that he is ready to get out of the house, go to college and plans to stay there until Thanksgiving break, not to return home for visits between.  He stated that his son said this and frustration due to how he feels about his family.  Patient stated that his son stated he was "tired of mom".  Patient went on to share how their 52 year old son often will come home from school and, based on how things are going at home, he may soon leave  to spend time at a friend's house for the next few hours.  Patient reports ongoing challenges in the family, his 41 year old daughter who copes with autism and challenging behaviors, can be a frequent stressor for his wife and for himself at times.  Patient shared the financial stress that he has had to manage due to his wife's excessive spending over the years.  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  Patient shares his continued attempts in the past and recently to share his concerns over his wife's frivolous spending habits which he stated are in part due to a way she copes with stress.  Through guided discovery, he identified the need to maintain boundaries with her, not reacting to "fix" or attend to his wife's repetitious tendencies of blaming him for her own happiness, they are not having enough money, her not being happy with her life etc.  He has promoted her to continue to receive support through receiving mental health care from her psychiatrist however, he stated that she has discontinued going to therapy for some time.    Intervention: supportive therapy   Diagnoses:    ICD-10-CM   1. Major depressive disorder, recurrent episode, moderate (HCC)  F33.1      Plan: Patient is to use CBT, mindfulness and coping skills to help manage decrease symptoms associated with their  diagnosis.  Patient to follow through with coping skills discussed.  Patient to follow through with engaging in some pleasurable interests for himself, making time to de- stress.   Patient to follow through with continuing to identify and set boundaries with his wife to avoid having arguments.  Long-term goal:    Reduce overall level, frequency, and intensity of the feelings of depression, anxiety and panic evidenced by decreased irritability, negative self talk  from 6 to 7 days/week to 0 to 1  days/week per client report for at least 3 consecutive months.    Short-term goal:  Verbally express understanding of the relationship between feelings of depression, anxiety and their impact on thinking patterns and behaviors. Verbalize an understanding of the role that distorted thinking plays in creating fears, excessive worry, and ruminations. Improve effective communication skills to reduce relational stress Decrease irritability frequency and anger outbursts  Progress: Progressing  [name] Session, [facility]

## 2020-10-07 ENCOUNTER — Other Ambulatory Visit: Payer: Self-pay | Admitting: Internal Medicine

## 2020-10-07 ENCOUNTER — Ambulatory Visit
Admission: RE | Admit: 2020-10-07 | Discharge: 2020-10-07 | Disposition: A | Discharge status: Home / Self Care | Payer: BLUE CROSS/BLUE SHIELD | Source: Ambulatory Visit | Attending: Internal Medicine | Admitting: Internal Medicine

## 2020-10-07 DIAGNOSIS — R1031 Right lower quadrant pain: Secondary | ICD-10-CM

## 2020-10-07 DIAGNOSIS — N202 Calculus of kidney with calculus of ureter: Secondary | ICD-10-CM | POA: Diagnosis not present

## 2020-10-07 MED ORDER — IOPAMIDOL (ISOVUE-300) INJECTION 61%
100.0000 mL | Freq: Once | INTRAVENOUS | Status: AC | PRN
  Administered 2020-10-07: 100 mL via INTRAVENOUS

## 2020-10-20 ENCOUNTER — Ambulatory Visit (INDEPENDENT_AMBULATORY_CARE_PROVIDER_SITE_OTHER): Payer: BC Managed Care – PPO | Admitting: Mental Health

## 2020-10-20 ENCOUNTER — Other Ambulatory Visit: Payer: Self-pay

## 2020-10-20 DIAGNOSIS — F331 Major depressive disorder, recurrent, moderate: Secondary | ICD-10-CM

## 2020-10-20 NOTE — Progress Notes (Signed)
[facility] Psychotherapy Note  Name: Christian [name] Date: 10/20/2020 MRN: [id number] DOB: 09/28/1975 PCP: Cleatis Polka., MD  Time spent: 55 minutes  Treatment: Individual Therapy  Mental Status Exam:   Appearance:   Casual     Behavior:  Appropriate  Motor:  Normal  Speech/Language:   Clear and Coherent    Affect:  Full range  Mood:  Depressed, pleasant  Thought process:  normal  Thought content:    WNL  Sensory/Perceptual disturbances:    WNL  Orientation:  x4  Attention:  Good  Concentration:  Good  Memory:  intact  Fund of knowledge:   WNL  Insight:    good  Judgment:   Good  Impulse Control:  Good   Reported Symptoms:  Irritable, depressed, anxiety, anger outbursts (verbal), tearful, feelings of inadequacy  Risk Assessment:  Danger to Self:  No Self-injurious Behavior: No Danger to Others: No Duty to Warn:no Physical Aggression / Violence:No  Access to Firearms a concern: No  Gang Involvement:No  Patient / guardian was educated about steps to take if suicide or homicide risk level increases between visits: yes While future psychiatric events cannot be accurately predicted, the patient does not currently require acute inpatient psychiatric care and does not currently meet [facility] involuntary commitment criteria.  Subjective:  Patient presents for today's session progress, recent events.  He shared how he continues to have stress at home, going on to share how his wife and stated that she needed some changes around the house.  He stated that she wanted things more organized and that she needed "something to focus on" per his report.  He stated that she often has to stay busy in part due to her challenges with anxiety and obsessive-compulsive tendencies.  He stated that their children want to switch rooms.  This became a source of argument due to their not wanting to help out and ultimately her feeling overwhelmed.  Patient stated however, that he is  often busy with work and she will blame him when he makes this a focus.  Patient identified feelings of frustration and confusion due to the responsibility of his job and the increased work that he has experienced for the last 2 months.  Ways he continues to try to set boundaries where shared as well as his efforts to communicate.  He stated that he may make efforts to clean, cook etc. but feels a lack of validation from his wife, not feeling what he does is enough.  He identified the need to continue to set boundaries where he wants to be mindful of when to focus on other priorities such as work and taking the children to their various activities along with other responsibilities as opposed to feeling like he has to "fix" issues as they arise when his wife feels overwhelmed; he identified wanting to break the cycle of his tendency to do this as he has done so for many years in the marriage.  He shared his efforts to continue to help problem solve with his wife, however, often becoming unappreciated or is being blamed in some way.    Intervention: supportive therapy, motivational interviewing   Diagnoses:    ICD-10-CM   1. Major depressive disorder, recurrent episode, moderate (HCC)  F33.1      Plan: Patient is to use CBT, mindfulness and coping skills to help manage decrease symptoms associated with their diagnosis.  Patient to follow through with coping skills discussed.  Patient to follow  through with engaging in some pleasurable interests for himself, making time to de- stress.   Patient to follow through with continuing to identify and set boundaries with his wife to avoid having arguments.  Long-term goal:    Reduce overall level, frequency, and intensity of the feelings of depression, anxiety and panic evidenced by decreased irritability, negative self talk  from 6 to 7 days/week to 0 to 1 days/week per client report for at least 3 consecutive months.    Short-term goal:  Verbally express  understanding of the relationship between feelings of depression, anxiety and their impact on thinking patterns and behaviors. Verbalize an understanding of the role that distorted thinking plays in creating fears, excessive worry, and ruminations. Improve effective communication skills to reduce relational stress Decrease irritability frequency and anger outbursts  Progress: Progressing  [name] Session, [facility]

## 2020-11-03 ENCOUNTER — Other Ambulatory Visit: Payer: Self-pay

## 2020-11-03 ENCOUNTER — Ambulatory Visit (INDEPENDENT_AMBULATORY_CARE_PROVIDER_SITE_OTHER): Payer: BC Managed Care – PPO | Admitting: Mental Health

## 2020-11-03 DIAGNOSIS — F331 Major depressive disorder, recurrent, moderate: Secondary | ICD-10-CM | POA: Diagnosis not present

## 2020-11-03 NOTE — Progress Notes (Signed)
[facility] Psychotherapy Note  Name: Christian [name] Date: 11/03/20 MRN: [id number] DOB: May 26, 1975 PCP: Cleatis Polka., MD  Time spent: 55 minutes  Treatment: Individual Therapy  Mental Status Exam:    Appearance:   Casual     Behavior:  Appropriate  Motor:  Normal  Speech/Language:   Clear and Coherent    Affect:  Full range  Mood:  Depressed, pleasant  Thought process:  normal  Thought content:    WNL  Sensory/Perceptual disturbances:    WNL  Orientation:  x4  Attention:  Good  Concentration:  Good  Memory:  intact  Fund of knowledge:   WNL  Insight:    good  Judgment:   Good  Impulse Control:  Good   Reported Symptoms:  Irritable, depressed, anxiety, anger outbursts (verbal), tearful, feelings of inadequacy  Risk Assessment:  Danger to Self:  No Self-injurious Behavior: No Danger to Others: No Duty to Warn:no Physical Aggression / Violence:No  Access to Firearms a concern: No  Gang Involvement:No  Patient / guardian was educated about steps to take if suicide or homicide risk level increases between visits: yes While future psychiatric events cannot be accurately predicted, the patient does not currently require acute inpatient psychiatric care and does not currently meet [facility] involuntary commitment criteria.  Subjective:  Patient presents for today's session progress, recent events.  He shared how he continues to have relational strain in his marriage.  He shared recent experiences, spent together as a family over Father's Day.  He stated that his wife had asked him what he would like to do that day and he mentioned taking the family out to eat into a local arcade where he stated that she is then reacted and frustration, irritated that she would have to get ready to go out.  He stated that he often wants to have "fine" on days such as Father's Day or other holidays and how his wife often wants him to work, to complete tasks around the house.   He stated that the house is currently "a disaster".  He stated that she has multiple projects going on, trying to redecorate bedrooms over the last several weeks, to emptying out their food pantry to organize it as well as wanting to clean the back patio, which involved moving furniture out along with many other items.  Patient shared feelings of frustration due to her having multiple projects going on at once, making it difficult due to the clutter.  He stated they have had some recent arguments, and how she mentioned that they need to go to couples counseling.  Patient welcomed this idea as he had mentioned it to her in the past but she did not want to follow through.  He stated that he has set up appointments in the past when they were to go to couples counseling however, he told her that he wanted her to set the appointment this time as a way to further illustrate that she can be committed to the engaging in the process.    Intervention: supportive therapy, motivational interviewing   Diagnoses:    ICD-10-CM   1. Major depressive disorder, recurrent episode, moderate (HCC)  F33.1         Plan: Patient is to use CBT, mindfulness and coping skills to help manage decrease symptoms associated with their diagnosis.  Patient to follow through with coping skills discussed.  Patient to follow through with engaging in some pleasurable interests for himself, making  time to de- stress.   Patient to follow through with continuing to identify and set boundaries with his wife to avoid having arguments.  Long-term goal:    Reduce overall level, frequency, and intensity of the feelings of depression, anxiety and panic evidenced by decreased irritability, negative self talk  from 6 to 7 days/week to 0 to 1 days/week per client report for at least 3 consecutive months.    Short-term goal:  Verbally express understanding of the relationship between feelings of depression, anxiety and their impact on thinking  patterns and behaviors. Verbalize an understanding of the role that distorted thinking plays in creating fears, excessive worry, and ruminations. Improve effective communication skills to reduce relational stress Decrease irritability frequency and anger outbursts  Progress: Progressing  [name] Session, [facility]

## 2020-11-18 ENCOUNTER — Ambulatory Visit: Payer: BC Managed Care – PPO | Admitting: Mental Health

## 2020-12-02 ENCOUNTER — Other Ambulatory Visit: Payer: Self-pay | Admitting: Adult Health

## 2020-12-02 DIAGNOSIS — F411 Generalized anxiety disorder: Secondary | ICD-10-CM

## 2020-12-02 DIAGNOSIS — F331 Major depressive disorder, recurrent, moderate: Secondary | ICD-10-CM

## 2020-12-03 DIAGNOSIS — G43709 Chronic migraine without aura, not intractable, without status migrainosus: Secondary | ICD-10-CM | POA: Diagnosis not present

## 2020-12-03 DIAGNOSIS — F419 Anxiety disorder, unspecified: Secondary | ICD-10-CM | POA: Diagnosis not present

## 2020-12-03 DIAGNOSIS — Z6841 Body Mass Index (BMI) 40.0 and over, adult: Secondary | ICD-10-CM | POA: Diagnosis not present

## 2020-12-04 NOTE — Telephone Encounter (Signed)
Looks like last apt was 01/2020? You advised 4 week f/u

## 2020-12-07 ENCOUNTER — Ambulatory Visit: Payer: BC Managed Care – PPO | Admitting: Mental Health

## 2020-12-28 DIAGNOSIS — L0292 Furuncle, unspecified: Secondary | ICD-10-CM | POA: Diagnosis not present

## 2021-01-12 ENCOUNTER — Ambulatory Visit (INDEPENDENT_AMBULATORY_CARE_PROVIDER_SITE_OTHER): Payer: BC Managed Care – PPO | Admitting: Mental Health

## 2021-01-12 ENCOUNTER — Other Ambulatory Visit: Payer: Self-pay

## 2021-01-12 DIAGNOSIS — F331 Major depressive disorder, recurrent, moderate: Secondary | ICD-10-CM

## 2021-01-12 NOTE — Progress Notes (Signed)
[facility] Psychotherapy Note  Name: Christian [name] Date: 01/12/21 MRN: [id number] DOB: January 07, 1976 PCP: Cleatis Polka., MD  Time spent: 50 minutes  Treatment: Individual Therapy  Mental Status Exam:    Appearance:   Casual     Behavior:  Appropriate  Motor:  Normal  Speech/Language:   Clear and Coherent    Affect:  Full range  Mood:  Depressed, pleasant  Thought process:  normal  Thought content:    WNL  Sensory/Perceptual disturbances:    WNL  Orientation:  x4  Attention:  Good  Concentration:  Good  Memory:  intact  Fund of knowledge:   WNL  Insight:    good  Judgment:   Good  Impulse Control:  Good   Reported Symptoms:  Irritable, depressed, anxiety, anger outbursts (verbal), tearful, feelings of inadequacy  Risk Assessment:  Danger to Self:  No Self-injurious Behavior: No Danger to Others: No Duty to Warn:no Physical Aggression / Violence:No  Access to Firearms a concern: No  Gang Involvement:No  Patient / guardian was educated about steps to take if suicide or homicide risk level increases between visits: yes While future psychiatric events cannot be accurately predicted, the patient does not currently require acute inpatient psychiatric care and does not currently meet [facility] involuntary commitment criteria.  Subjective:  Patient presents for today's session progress.  Patient shared that he has gained some weight over the past month.  Patient shared how it may relate to his ongoing stress at home, specifically with his marital relationship.  He went on to share how his oldest son went away to college, how this significantly impacted his wife as she was mournful of his leaving him.  Patient shared how she is closest with her oldest son, not only when compared to the other children but also at times when compared to him as well.  He shared how his daughter had a volleyball game recently, that he attended the game, but also was trying to balance  his work day at the same time, having to take a phone call.  He stated this upset his wife, where she makes critical comments toward him about his not doing his best as a father.  Patient stated that his son had a football game recently, where she came to the game toward the end which he feels is contradictive.  He stated that she will not listen to him when he tries to talk with her, expresses feelings, at times confusion; he shared how she denies taking accountability often and "does not apologize".  He shared how he makes an effort to be there for his children, often taking them to practices, attending games, assisting with school and other activities while also working full-time which can be challenging.  He stated his wife continues to work part-time, about 10 to 12 hours/week recently, yet struggles to be as engaged as he is with providing transportation, cooking dinner etc.  He stated that their house is often in disarray and she wants someone to be hired as a cleaning person to assist them.  Provide support throughout, assisted him in clarifying feelings and needs, specifically what he needs to remind himself to manage stress.    Intervention: supportive therapy, motivational interviewing   Diagnoses:    ICD-10-CM   1. Major depressive disorder, recurrent episode, moderate (HCC)  F33.1          Plan: Patient is to use CBT, mindfulness and coping skills to help manage decrease  symptoms associated with their diagnosis.  Patient to follow through with coping skills discussed.  Patient to follow through with engaging in some pleasurable interests for himself, making time to de- stress.   Patient to follow through with continuing to identify and set boundaries with his wife to avoid having arguments.  Long-term goal:    Reduce overall level, frequency, and intensity of the feelings of depression, anxiety and panic evidenced by decreased irritability, negative self talk  from 6 to 7 days/week to  0 to 1 days/week per client report for at least 3 consecutive months.    Short-term goal:  Verbally express understanding of the relationship between feelings of depression, anxiety and their impact on thinking patterns and behaviors. Verbalize an understanding of the role that distorted thinking plays in creating fears, excessive worry, and ruminations. Improve effective communication skills to reduce relational stress Decrease irritability frequency and anger outbursts  Progress: Progressing  [name] Session, [facility]

## 2021-01-26 ENCOUNTER — Ambulatory Visit (INDEPENDENT_AMBULATORY_CARE_PROVIDER_SITE_OTHER): Payer: BC Managed Care – PPO | Admitting: Mental Health

## 2021-01-26 ENCOUNTER — Other Ambulatory Visit: Payer: Self-pay

## 2021-01-26 DIAGNOSIS — F331 Major depressive disorder, recurrent, moderate: Secondary | ICD-10-CM

## 2021-01-26 NOTE — Progress Notes (Signed)
[facility] Psychotherapy Note  Name: DEMETRION WESBY Date: 01/26/21 MRN: [id number] DOB: 1975-06-08 PCP: Cleatis Polka., MD  Time spent: 55 minutes  Treatment: Individual Therapy  Mental Status Exam:    Appearance:   Casual     Behavior:  Appropriate  Motor:  Normal  Speech/Language:   Clear and Coherent    Affect:  Full range  Mood:  Depressed, pleasant  Thought process:  normal  Thought content:    WNL  Sensory/Perceptual disturbances:    WNL  Orientation:  x4  Attention:  Good  Concentration:  Good  Memory:  intact  Fund of knowledge:   WNL  Insight:    good  Judgment:   Good  Impulse Control:  Good   Reported Symptoms:  Irritable, depressed, anxiety, anger outbursts (verbal), tearful, feelings of inadequacy  Risk Assessment:  Danger to Self:  No Self-injurious Behavior: No Danger to Others: No Duty to Warn:no Physical Aggression / Violence:No  Access to Firearms a concern: No  Gang Involvement:No  Patient / guardian was educated about steps to take if suicide or homicide risk level increases between visits: yes While future psychiatric events cannot be accurately predicted, the patient does not currently require acute inpatient psychiatric care and does not currently meet [facility] involuntary commitment criteria.  Subjective:  Patient presents for today's session progress.  Patient shared ongoing stress in the marital relationship.  He stated that his son continues to adjust to moving away to college, how he has tried to be supportive as he has had some social challenges but doing well academically.  He stated that he is working on reducing some of his financial stress as he is doing well at work, has been able to be highly productive and as a result his income is increased considerably and he looks to get out of debt in the coming months.  He stated when he shared this positive financial changes with his wife, that she was not relieved, rather  that she gets stressed when he talks about money regardless.  Through guided discovery, he identified the need to feel a sense of validation for his efforts at home and through his work.  Facilitated his identifying any positive interactions they have had where he stated that she validated his working harder together children on an occasion.  As he had indicated that he wants her to do this more often, we discussed the potential significance of his verbally affirming her when she makes these efforts.     Intervention: supportive therapy, motivational interviewing   Diagnoses:    ICD-10-CM   1. Major depressive disorder, recurrent episode, moderate (HCC)  F33.1           Plan: Patient is to use CBT, mindfulness and coping skills to help manage decrease symptoms associated with their diagnosis.  Patient to follow through with coping skills discussed.  Patient to follow through with engaging in some pleasurable interests for himself, making time to de- stress.   Patient to take opportunities to verbally affirm his wife when he feels a sense of support from her to improve their communication.  Long-term goal:    Reduce overall level, frequency, and intensity of the feelings of depression, anxiety and panic evidenced by decreased irritability, negative self talk  from 6 to 7 days/week to 0 to 1 days/week per client report for at least 3 consecutive months.    Short-term goal:  Verbally express understanding of the relationship between feelings of  depression, anxiety and their impact on thinking patterns and behaviors. Verbalize an understanding of the role that distorted thinking plays in creating fears, excessive worry, and ruminations. Improve effective communication skills to reduce relational stress Decrease irritability frequency and anger outbursts  Progress: Progressing  [name] Session, [facility]

## 2021-02-09 ENCOUNTER — Other Ambulatory Visit: Payer: Self-pay

## 2021-02-09 ENCOUNTER — Ambulatory Visit (INDEPENDENT_AMBULATORY_CARE_PROVIDER_SITE_OTHER): Payer: BC Managed Care – PPO | Admitting: Mental Health

## 2021-02-09 DIAGNOSIS — F331 Major depressive disorder, recurrent, moderate: Secondary | ICD-10-CM | POA: Diagnosis not present

## 2021-02-09 NOTE — Progress Notes (Signed)
[facility] Psychotherapy Note  Name: Christian [name] Date: 02/09/21 MRN: [id number] DOB: [birth year] PCP: Cleatis Polka., MD  Time spent: 56 minutes  Treatment: Individual Therapy  Mental Status Exam:    Appearance:   Casual     Behavior:  Appropriate  Motor:  Normal  Speech/Language:   Clear and Coherent    Affect:  Full range  Mood:  Euthymic, pleasant  Thought process:  normal  Thought content:    WNL  Sensory/Perceptual disturbances:    WNL  Orientation:  x4  Attention:  Good  Concentration:  Good  Memory:  intact  Fund of knowledge:   WNL  Insight:    good  Judgment:   Good  Impulse Control:  Good   Reported Symptoms:  Irritable, depressed, anxiety, anger outbursts (verbal), tearful, feelings of inadequacy  Risk Assessment:  Danger to Self:  No Self-injurious Behavior: No Danger to Others: No Duty to Warn:no Physical Aggression / Violence:No  Access to Firearms a concern: No  Gang Involvement:No  Patient / guardian was educated about steps to take if suicide or homicide risk level increases between visits: yes While future psychiatric events cannot be accurately predicted, the patient does not currently require acute inpatient psychiatric care and does not currently meet [facility] involuntary commitment criteria.  Subjective:  Patient presents for today's session progress.  He shared how his son continues to do well in college, sharing some details.  He stated that his wife is adjusted more to his being gone.  He went on to share a recent situation where he got upset, acknowledging that he has difficulty managing his anger in some situations.  He went on to share how the family were going to his daughter's volleyball game, he was trying to fix a seat that was not moving in his wife's car, this putting pressure on him as they were trying not to be late for the game.  He stated that he also noticed that his wife's car had a lot of trash scattered in  the back, some due to how his daughter.  Facilitated his identifying how he would rather have handled the situation where he shared that he could have just fixed the seat at a later time and they just get in the vehicle to get underway.  We discussed ways to cope, giving himself time to take moments to recognize physical sensations, thoughts that are changing when he starts to get upset, angry to allow himself to be less reactive.      Intervention: supportive therapy, motivational interviewing, CBT   Diagnoses:    ICD-10-CM   1. Major depressive disorder, recurrent episode, moderate (HCC)  F33.1        Plan: Patient is to use CBT, mindfulness and coping skills to help manage decrease symptoms associated with their diagnosis.  Patient to follow through with coping skills discussed.  Patient to follow through with engaging in some pleasurable interests for himself, making time to de- stress.   Patient to take opportunities to verbally affirm his wife when he feels a sense of support from her to improve their communication.  Long-term goal:    Reduce overall level, frequency, and intensity of the feelings of depression, anxiety and panic evidenced by decreased irritability, negative self talk  from 6 to 7 days/week to 0 to 1 days/week per client report for at least 3 consecutive months.    Short-term goal:  Verbally express understanding of the relationship between feelings of  depression, anxiety and their impact on thinking patterns and behaviors. Verbalize an understanding of the role that distorted thinking plays in creating fears, excessive worry, and ruminations. Improve effective communication skills to reduce relational stress Decrease irritability frequency and anger outbursts  Progress: Progressing  [name] Session, [facility]

## 2021-02-25 ENCOUNTER — Ambulatory Visit (INDEPENDENT_AMBULATORY_CARE_PROVIDER_SITE_OTHER): Payer: BC Managed Care – PPO | Admitting: Mental Health

## 2021-02-25 ENCOUNTER — Other Ambulatory Visit: Payer: Self-pay

## 2021-02-25 DIAGNOSIS — F331 Major depressive disorder, recurrent, moderate: Secondary | ICD-10-CM

## 2021-02-25 NOTE — Progress Notes (Signed)
[facility] Psychotherapy Note  Name: Christian [name] Date: 02/25/21 MRN: [id number] DOB: June 14, 1975 PCP: Cleatis Polka., MD  Time spent: 56 minutes  Treatment: Individual Therapy  Mental Status Exam:    Appearance:   Casual     Behavior:  Appropriate  Motor:  Normal  Speech/Language:   Clear and Coherent    Affect:  Full range  Mood:  Euthymic, pleasant  Thought process:  normal  Thought content:    WNL  Sensory/Perceptual disturbances:    WNL  Orientation:  x4  Attention:  Good  Concentration:  Good  Memory:  intact  Fund of knowledge:   WNL  Insight:    good  Judgment:   Good  Impulse Control:  Good   Reported Symptoms:  Irritable, depressed, anxiety, anger outbursts (verbal), tearful, feelings of inadequacy  Risk Assessment:  Danger to Self:  No Self-injurious Behavior: No Danger to Others: No Duty to Warn:no Physical Aggression / Violence:No  Access to Firearms a concern: No  Gang Involvement:No  Patient / guardian was educated about steps to take if suicide or homicide risk level increases between visits: yes While future psychiatric events cannot be accurately predicted, the patient does not currently require acute inpatient psychiatric care and does not currently meet [facility] involuntary commitment criteria.  Subjective:  Patient presents for today's session progress.  Patient shared progress, how he and his wife continue to have disagreements, problems with communication.  He shared how they were doing better referring to their getting along with some improved communication over the past several weeks up until the last few days.  He stated that she came to him overwhelmed a few days ago, sad.  He shared how he tried to be supportive and encouraged her to reach out to reengage in therapy but he feels that she will not follow through at this time.  He shared how she does not disclose to him additional details, such as what psychiatric  medications she is taking however, he states that he knows she is taking them to treat her anxiety and depression as well as her diagnosis of ADHD.  He shared a recent challenge with her communication being centered around her not using the family on line calendar with which she has encouraged her.  He shared how she got frustrated due to his taking the wrong vehicle to work recently as she needs it to transport their dogs to their appointment.  Patient, frustrated shared how if she had used the counter they would have less issues that arise.  He shared also how she expressed his needing to "take over" referring to assisting with her daughter over the week.  Patient stated this was particularly challenging due to her having sports activities which required extensive travel time.  Patient stated this, along with his work demands made him feel more frustrated, stressed over the week as a result.  He identified the need for he and his wife to improve their communication, such as they are communicating about what days he could help her with their daughter while also being mindful of his daily work schedule, demands.       Intervention: supportive therapy, motivational interviewing, motivational interviewing   Diagnoses:    ICD-10-CM   1. Major depressive disorder, recurrent episode, moderate (HCC)  F33.1         Plan: Patient is to use CBT, mindfulness and coping skills to help manage decrease symptoms associated with their diagnosis.  Patient to  follow through with coping skills discussed.  Patient to follow through with engaging in some pleasurable interests for himself, making time to de- stress.   Patient to take opportunities to verbally affirm his wife when he feels a sense of support from her to improve their communication.  Long-term goal:    Reduce overall level, frequency, and intensity of the feelings of depression, anxiety and panic evidenced by decreased irritability, negative self talk   from 6 to 7 days/week to 0 to 1 days/week per client report for at least 3 consecutive months.    Short-term goal:  Verbally express understanding of the relationship between feelings of depression, anxiety and their impact on thinking patterns and behaviors. Verbalize an understanding of the role that distorted thinking plays in creating fears, excessive worry, and ruminations. Improve effective communication skills to reduce relational stress Decrease irritability frequency and anger outbursts  Progress: Progressing  [name] Session, [facility]

## 2021-03-04 DIAGNOSIS — L02211 Cutaneous abscess of abdominal wall: Secondary | ICD-10-CM | POA: Diagnosis not present

## 2021-03-09 ENCOUNTER — Ambulatory Visit (INDEPENDENT_AMBULATORY_CARE_PROVIDER_SITE_OTHER): Payer: BC Managed Care – PPO | Admitting: Mental Health

## 2021-03-09 ENCOUNTER — Other Ambulatory Visit: Payer: Self-pay

## 2021-03-09 DIAGNOSIS — F331 Major depressive disorder, recurrent, moderate: Secondary | ICD-10-CM

## 2021-03-09 DIAGNOSIS — Z6841 Body Mass Index (BMI) 40.0 and over, adult: Secondary | ICD-10-CM | POA: Diagnosis not present

## 2021-03-09 DIAGNOSIS — R222 Localized swelling, mass and lump, trunk: Secondary | ICD-10-CM | POA: Diagnosis not present

## 2021-03-09 NOTE — Progress Notes (Signed)
[facility] Psychotherapy Note  Name: Christian [name] Date: 03/09/21 MRN: [id number] DOB: [birth year] PCP: Cleatis Polka., MD  Time spent: 54 minutes  Treatment: Individual Therapy  Mental Status Exam:    Appearance:   Casual     Behavior:  Appropriate  Motor:  Normal  Speech/Language:   Clear and Coherent    Affect:  Full range  Mood:  Euthymic, pleasant  Thought process:  normal  Thought content:    WNL  Sensory/Perceptual disturbances:    WNL  Orientation:  x4  Attention:  Good  Concentration:  Good  Memory:  intact  Fund of knowledge:   WNL  Insight:    good  Judgment:   Good  Impulse Control:  Good   Reported Symptoms:  Irritable, depressed, anxiety, anger outbursts (verbal), tearful, feelings of inadequacy  Risk Assessment:  Danger to Self:  No Self-injurious Behavior: No Danger to Others: No Duty to Warn:no Physical Aggression / Violence:No  Access to Firearms a concern: No  Gang Involvement:No  Patient / guardian was educated about steps to take if suicide or homicide risk level increases between visits: yes While future psychiatric events cannot be accurately predicted, the patient does not currently require acute inpatient psychiatric care and does not currently meet [facility] involuntary commitment criteria.  Subjective:  Patient presents for today's session progress. Patient vented feelings related to ongoing family relationship stressors. He stated his son is now back in college. Reports the ongoing challenges of his daughter, as she needs assistance daily with many aspects of her functioning such as hygiene, dietary needs etc. He said she needs monitoring often due to her damaging items in the house, such as pudding play into their seat cushions of their couch, or leaving a mess with whatever item she may be using. Hey shared how his wife can get upset, "it can last for hours", often after having to work with their daughter to get ready in  the morning, or other when other situations with her behavior transpire. Identified how he needs to maintain a boundary as he said recently with her, starting his he did not want her to tell at him or blame him unnecessarily.    Intervention: supportive therapy, motivational interviewing, motivational interviewing   Diagnoses:    ICD-10-CM   1. Major depressive disorder, recurrent episode, moderate (HCC)  F33.1          Plan: Patient is to use CBT, mindfulness and coping skills to help manage decrease symptoms associated with their diagnosis.  Patient to follow through with coping skills discussed.  Patient to follow through with engaging in some pleasurable interests for himself, making time to de- stress.   Patient to take opportunities to verbally affirm his wife when he feels a sense of support from her to improve their communication.  Long-term goal:    Reduce overall level, frequency, and intensity of the feelings of depression, anxiety and panic evidenced by decreased irritability, negative self talk  from 6 to 7 days/week to 0 to 1 days/week per client report for at least 3 consecutive months.    Short-term goal:  Verbally express understanding of the relationship between feelings of depression, anxiety and their impact on thinking patterns and behaviors. Verbalize an understanding of the role that distorted thinking plays in creating fears, excessive worry, and ruminations. Improve effective communication skills to reduce relational stress Decrease irritability frequency and anger outbursts  Progress: Progressing  [name] Session, [facility]

## 2021-03-10 ENCOUNTER — Ambulatory Visit: Payer: Self-pay | Admitting: Surgery

## 2021-03-18 DIAGNOSIS — M546 Pain in thoracic spine: Secondary | ICD-10-CM | POA: Diagnosis not present

## 2021-03-18 DIAGNOSIS — M5134 Other intervertebral disc degeneration, thoracic region: Secondary | ICD-10-CM | POA: Diagnosis not present

## 2021-03-19 DIAGNOSIS — R1084 Generalized abdominal pain: Secondary | ICD-10-CM | POA: Diagnosis not present

## 2021-04-02 ENCOUNTER — Ambulatory Visit (INDEPENDENT_AMBULATORY_CARE_PROVIDER_SITE_OTHER): Payer: BC Managed Care – PPO | Admitting: Mental Health

## 2021-04-02 ENCOUNTER — Other Ambulatory Visit: Payer: Self-pay

## 2021-04-02 DIAGNOSIS — F331 Major depressive disorder, recurrent, moderate: Secondary | ICD-10-CM | POA: Diagnosis not present

## 2021-04-02 NOTE — Progress Notes (Signed)
[facility] Psychotherapy Note  Name: Christian [name] Date: 04/02/21 MRN: [id number] DOB: [birth year] PCP: Cleatis Polka., MD  Time spent: 55 minutes  Treatment: Individual Therapy  Mental Status Exam:    Appearance:   Casual     Behavior:  Appropriate  Motor:  Normal  Speech/Language:   Clear and Coherent    Affect:  Full range  Mood:  Euthymic, pleasant  Thought process:  normal  Thought content:    WNL  Sensory/Perceptual disturbances:    WNL  Orientation:  x4  Attention:  Good  Concentration:  Good  Memory:  intact  Fund of knowledge:   WNL  Insight:    good  Judgment:   Good  Impulse Control:  Good   Reported Symptoms:  Irritable, depressed, anxiety, anger outbursts (verbal), tearful, feelings of inadequacy  Risk Assessment:  Danger to Self:  No Self-injurious Behavior: No Danger to Others: No Duty to Warn:no Physical Aggression / Violence:No  Access to Firearms a concern: No  Gang Involvement:No  Patient / guardian was educated about steps to take if suicide or homicide risk level increases between visits: yes While future psychiatric events cannot be accurately predicted, the patient does not currently require acute inpatient psychiatric care and does not currently meet [facility] involuntary commitment criteria.  Subjective:  Patient presents for today's session progress. Patient shared recent progress, ongoing marital stress and he went on to share some details related to some recent arguments that have ensued. Patient feels that he is finally achieved goals in his career which have allowed them to be more financially secure however, patients stated his wife recently told him that she does not care about the money. Patient identifies feelings of being unappreciated after his identifying the span of several years with which he kept up with not only his career goals but also daily tennis and needs with their children such as coming ham and  immediately making dinner, cleaning etc. Patient share the ongoing challenges of raising their daughter due to her developmental delays. We reviewed the potential significance of their engaging in couples counseling where he stated that he has made attempts before with his wife and at this point, has informed her that he would attend but he would like her to instigate the appointment.  Intervention: supportive therapy, motivational interviewing, motivational interviewing   Diagnoses:    ICD-10-CM   1. Major depressive disorder, recurrent episode, moderate (HCC)  F33.1         Plan: Patient is to use CBT, mindfulness and coping skills to help manage decrease symptoms associated with their diagnosis.  Patient to follow through with coping skills discussed.  Patient to follow through with engaging in some pleasurable interests for himself, making time to de- stress.   Patient to take opportunities to verbally affirm his wife when he feels a sense of support from her to improve their communication.  Long-term goal:    Reduce overall level, frequency, and intensity of the feelings of depression, anxiety and panic evidenced by decreased irritability, negative self talk  from 6 to 7 days/week to 0 to 1 days/week per client report for at least 3 consecutive months.    Short-term goal:  Verbally express understanding of the relationship between feelings of depression, anxiety and their impact on thinking patterns and behaviors. Verbalize an understanding of the role that distorted thinking plays in creating fears, excessive worry, and ruminations. Improve effective communication skills to reduce relational stress Decrease irritability frequency  and anger outbursts  Progress: Progressing  [name] Session, [facility]

## 2021-04-05 DIAGNOSIS — R1084 Generalized abdominal pain: Secondary | ICD-10-CM | POA: Diagnosis not present

## 2021-04-05 DIAGNOSIS — R110 Nausea: Secondary | ICD-10-CM | POA: Diagnosis not present

## 2021-04-05 DIAGNOSIS — N202 Calculus of kidney with calculus of ureter: Secondary | ICD-10-CM | POA: Diagnosis not present

## 2021-04-05 DIAGNOSIS — N2882 Megaloureter: Secondary | ICD-10-CM | POA: Diagnosis not present

## 2021-04-05 DIAGNOSIS — N201 Calculus of ureter: Secondary | ICD-10-CM | POA: Diagnosis not present

## 2021-04-06 DIAGNOSIS — D171 Benign lipomatous neoplasm of skin and subcutaneous tissue of trunk: Secondary | ICD-10-CM | POA: Diagnosis not present

## 2021-04-06 HISTORY — PX: ABDOMINAL SURGERY: SHX537

## 2021-04-14 ENCOUNTER — Ambulatory Visit: Payer: BC Managed Care – PPO | Admitting: Mental Health

## 2021-04-20 DIAGNOSIS — R1084 Generalized abdominal pain: Secondary | ICD-10-CM | POA: Diagnosis not present

## 2021-04-20 DIAGNOSIS — N50812 Left testicular pain: Secondary | ICD-10-CM | POA: Diagnosis not present

## 2021-04-20 DIAGNOSIS — N201 Calculus of ureter: Secondary | ICD-10-CM | POA: Diagnosis not present

## 2021-04-20 DIAGNOSIS — N200 Calculus of kidney: Secondary | ICD-10-CM | POA: Diagnosis not present

## 2021-04-22 ENCOUNTER — Other Ambulatory Visit: Payer: Self-pay | Admitting: Urology

## 2021-04-22 ENCOUNTER — Other Ambulatory Visit: Payer: Self-pay

## 2021-04-22 ENCOUNTER — Encounter (HOSPITAL_BASED_OUTPATIENT_CLINIC_OR_DEPARTMENT_OTHER): Payer: Self-pay | Admitting: Urology

## 2021-04-22 DIAGNOSIS — M5414 Radiculopathy, thoracic region: Secondary | ICD-10-CM | POA: Diagnosis not present

## 2021-04-22 NOTE — Progress Notes (Signed)
Spoke w/ via phone for pre-op interview---pt Lab needs dos----ISTAT, EKG               Lab results------none COVID test -----patient states asymptomatic no test needed Arrive at -------1015 on 04/23/21 NPO after MN NO Solid Food.  Clear liquids from MN until---0915 Med rec completed Medications to take morning of surgery -----none (pt takes medications at night) Diabetic medication -----n/a Patient instructed no nail polish to be worn day of surgery Patient instructed to bring photo id and insurance card day of surgery Patient aware to have Driver (ride ) / caregiver    for 24 hours after surgery - wife Christian [name] Patient Special Instructions -----none Pre-Op special Istructions -----Pt stated that his wife will have to pick up their daughter at school at 3:15 pm. She should be back by 3:40 pm. Patient verbalized understanding of instructions that were given at this phone interview. Patient denies shortness of breath, chest pain, fever, cough at this phone interview.

## 2021-04-23 ENCOUNTER — Ambulatory Visit (HOSPITAL_COMMUNITY): Payer: BC Managed Care – PPO

## 2021-04-23 ENCOUNTER — Ambulatory Visit (HOSPITAL_BASED_OUTPATIENT_CLINIC_OR_DEPARTMENT_OTHER)
Admission: RE | Admit: 2021-04-23 | Discharge: 2021-04-23 | Disposition: A | Discharge status: Home / Self Care | Payer: BC Managed Care – PPO | Attending: Urology | Admitting: Urology

## 2021-04-23 ENCOUNTER — Ambulatory Visit (HOSPITAL_BASED_OUTPATIENT_CLINIC_OR_DEPARTMENT_OTHER): Payer: BC Managed Care – PPO | Admitting: Certified Registered"

## 2021-04-23 ENCOUNTER — Encounter (HOSPITAL_BASED_OUTPATIENT_CLINIC_OR_DEPARTMENT_OTHER): Payer: Self-pay | Admitting: Urology

## 2021-04-23 ENCOUNTER — Encounter (HOSPITAL_BASED_OUTPATIENT_CLINIC_OR_DEPARTMENT_OTHER)
Admission: RE | Disposition: A | Discharge status: Home / Self Care | Payer: Self-pay | Source: Home / Self Care | Attending: Urology

## 2021-04-23 DIAGNOSIS — I10 Essential (primary) hypertension: Secondary | ICD-10-CM | POA: Diagnosis not present

## 2021-04-23 DIAGNOSIS — N200 Calculus of kidney: Secondary | ICD-10-CM | POA: Diagnosis not present

## 2021-04-23 DIAGNOSIS — N400 Enlarged prostate without lower urinary tract symptoms: Secondary | ICD-10-CM | POA: Diagnosis not present

## 2021-04-23 DIAGNOSIS — G4730 Sleep apnea, unspecified: Secondary | ICD-10-CM | POA: Diagnosis not present

## 2021-04-23 DIAGNOSIS — Z01818 Encounter for other preprocedural examination: Secondary | ICD-10-CM | POA: Diagnosis not present

## 2021-04-23 DIAGNOSIS — N202 Calculus of kidney with calculus of ureter: Secondary | ICD-10-CM | POA: Diagnosis not present

## 2021-04-23 DIAGNOSIS — N201 Calculus of ureter: Secondary | ICD-10-CM | POA: Diagnosis not present

## 2021-04-23 DIAGNOSIS — I878 Other specified disorders of veins: Secondary | ICD-10-CM | POA: Diagnosis not present

## 2021-04-23 HISTORY — DX: Sleep apnea, unspecified: G47.30

## 2021-04-23 HISTORY — DX: Other intervertebral disc degeneration, thoracic region: M51.34

## 2021-04-23 HISTORY — PX: CYSTOSCOPY WITH RETROGRADE PYELOGRAM, URETEROSCOPY AND STENT PLACEMENT: SHX5789

## 2021-04-23 HISTORY — DX: Depression, unspecified: F32.A

## 2021-04-23 HISTORY — DX: Family history of other specified conditions: Z84.89

## 2021-04-23 HISTORY — DX: Presence of spectacles and contact lenses: Z97.3

## 2021-04-23 HISTORY — DX: Prediabetes: R73.03

## 2021-04-23 LAB — POCT I-STAT, CHEM 8
BUN: 23 mg/dL — ABNORMAL HIGH (ref 6–20)
Calcium, Ion: 1.11 mmol/L — ABNORMAL LOW (ref 1.15–1.40)
Chloride: 106 mmol/L (ref 98–111)
Creatinine, Ser: 0.8 mg/dL (ref 0.61–1.24)
Glucose, Bld: 118 mg/dL — ABNORMAL HIGH (ref 70–99)
HCT: 48 % (ref 39.0–52.0)
Hemoglobin: 16.3 g/dL (ref 13.0–17.0)
Potassium: 3.1 mmol/L — ABNORMAL LOW (ref 3.5–5.1)
Sodium: 139 mmol/L (ref 135–145)
TCO2: 20 mmol/L — ABNORMAL LOW (ref 22–32)

## 2021-04-23 SURGERY — CYSTOURETEROSCOPY, WITH RETROGRADE PYELOGRAM AND STENT INSERTION
Anesthesia: General | Site: Ureter | Laterality: Left

## 2021-04-23 MED ORDER — KETOROLAC TROMETHAMINE 30 MG/ML IJ SOLN
INTRAMUSCULAR | Status: AC
  Filled 2021-04-23: qty 1

## 2021-04-23 MED ORDER — ACETAMINOPHEN 10 MG/ML IV SOLN: 1000.0000 mg | Freq: Once | INTRAVENOUS | Status: DC | PRN

## 2021-04-23 MED ORDER — OXYCODONE HCL 5 MG PO TABS
5.0000 mg | ORAL_TABLET | Freq: Once | ORAL | Status: AC | PRN
  Administered 2021-04-23: 5 mg via ORAL

## 2021-04-23 MED ORDER — PROPOFOL 10 MG/ML IV BOLUS
INTRAVENOUS | Status: AC
  Filled 2021-04-23: qty 20

## 2021-04-23 MED ORDER — MIDAZOLAM HCL 2 MG/2ML IJ SOLN
INTRAMUSCULAR | Status: AC
  Filled 2021-04-23: qty 2

## 2021-04-23 MED ORDER — CEPHALEXIN 500 MG PO CAPS
500.0000 mg | ORAL_CAPSULE | Freq: Two times a day (BID) | ORAL | 0 refills | Status: AC
Start: 2021-04-23 — End: 2021-04-26

## 2021-04-23 MED ORDER — FENTANYL CITRATE (PF) 100 MCG/2ML IJ SOLN
INTRAMUSCULAR | Status: AC
  Filled 2021-04-23: qty 2

## 2021-04-23 MED ORDER — DEXAMETHASONE SODIUM PHOSPHATE 10 MG/ML IJ SOLN
INTRAMUSCULAR | Status: AC
  Filled 2021-04-23: qty 1

## 2021-04-23 MED ORDER — MIDAZOLAM HCL 2 MG/2ML IJ SOLN
INTRAMUSCULAR | Status: DC | PRN
  Administered 2021-04-23: 2 mg via INTRAVENOUS

## 2021-04-23 MED ORDER — CEFAZOLIN SODIUM-DEXTROSE 2-4 GM/100ML-% IV SOLN
INTRAVENOUS | Status: AC
  Filled 2021-04-23: qty 100

## 2021-04-23 MED ORDER — LIDOCAINE 2% (20 MG/ML) 5 ML SYRINGE
INTRAMUSCULAR | Status: DC | PRN
  Administered 2021-04-23: 60 mg via INTRAVENOUS

## 2021-04-23 MED ORDER — OXYCODONE HCL 5 MG/5ML PO SOLN: 5.0000 mg | Freq: Once | ORAL | Status: AC | PRN

## 2021-04-23 MED ORDER — LACTATED RINGERS IV SOLN: INTRAVENOUS | Status: DC

## 2021-04-23 MED ORDER — OXYBUTYNIN CHLORIDE 5 MG PO TABS: 5.0000 mg | ORAL_TABLET | Freq: Three times a day (TID) | ORAL | 1 refills | Status: DC | PRN

## 2021-04-23 MED ORDER — PROMETHAZINE HCL 25 MG/ML IJ SOLN: 6.2500 mg | INTRAMUSCULAR | Status: DC | PRN

## 2021-04-23 MED ORDER — OXYCODONE HCL 5 MG PO TABS
ORAL_TABLET | ORAL | Status: AC
  Filled 2021-04-23: qty 1

## 2021-04-23 MED ORDER — PROPOFOL 10 MG/ML IV BOLUS
INTRAVENOUS | Status: DC | PRN
  Administered 2021-04-23: 200 mg via INTRAVENOUS

## 2021-04-23 MED ORDER — ONDANSETRON HCL 4 MG/2ML IJ SOLN
INTRAMUSCULAR | Status: AC
  Filled 2021-04-23: qty 2

## 2021-04-23 MED ORDER — ACETAMINOPHEN 160 MG/5ML PO SOLN: 325.0000 mg | ORAL | Status: DC | PRN

## 2021-04-23 MED ORDER — FENTANYL CITRATE (PF) 100 MCG/2ML IJ SOLN: 25.0000 ug | INTRAMUSCULAR | Status: DC | PRN

## 2021-04-23 MED ORDER — AMISULPRIDE (ANTIEMETIC) 5 MG/2ML IV SOLN: 10.0000 mg | Freq: Once | INTRAVENOUS | Status: DC | PRN

## 2021-04-23 MED ORDER — SODIUM CHLORIDE 0.9 % IR SOLN
Status: DC | PRN
  Administered 2021-04-23: 3000 mL

## 2021-04-23 MED ORDER — KETOROLAC TROMETHAMINE 30 MG/ML IJ SOLN
INTRAMUSCULAR | Status: DC | PRN
  Administered 2021-04-23: 30 mg via INTRAVENOUS

## 2021-04-23 MED ORDER — ONDANSETRON HCL 4 MG/2ML IJ SOLN
INTRAMUSCULAR | Status: DC | PRN
  Administered 2021-04-23: 4 mg via INTRAVENOUS

## 2021-04-23 MED ORDER — DEXAMETHASONE SODIUM PHOSPHATE 10 MG/ML IJ SOLN
INTRAMUSCULAR | Status: DC | PRN
  Administered 2021-04-23: 5 mg via INTRAVENOUS

## 2021-04-23 MED ORDER — 0.9 % SODIUM CHLORIDE (POUR BTL) OPTIME
TOPICAL | Status: DC | PRN
  Administered 2021-04-23: 500 mL

## 2021-04-23 MED ORDER — OXYCODONE HCL 5 MG PO TABS: 5.0000 mg | ORAL_TABLET | Freq: Three times a day (TID) | ORAL | 0 refills | Status: AC | PRN

## 2021-04-23 MED ORDER — IOHEXOL 300 MG/ML  SOLN
INTRAMUSCULAR | Status: DC | PRN
  Administered 2021-04-23: 4 mL via URETHRAL

## 2021-04-23 MED ORDER — ACETAMINOPHEN 325 MG PO TABS: 325.0000 mg | ORAL_TABLET | ORAL | Status: DC | PRN

## 2021-04-23 MED ORDER — LIDOCAINE 2% (20 MG/ML) 5 ML SYRINGE
INTRAMUSCULAR | Status: AC
  Filled 2021-04-23: qty 5

## 2021-04-23 MED ORDER — FENTANYL CITRATE (PF) 100 MCG/2ML IJ SOLN
INTRAMUSCULAR | Status: DC | PRN
  Administered 2021-04-23 (×3): 50 ug via INTRAVENOUS

## 2021-04-23 MED ORDER — CEFAZOLIN SODIUM-DEXTROSE 2-4 GM/100ML-% IV SOLN
2.0000 g | INTRAVENOUS | Status: AC
  Administered 2021-04-23: 2 g via INTRAVENOUS

## 2021-04-23 SURGICAL SUPPLY — 19 items
BAG DRAIN URO-CYSTO SKYTR STRL (DRAIN) ×3 IMPLANT
BAG DRN UROCATH (DRAIN) ×2
BASKET STONE 1.7 NGAGE (UROLOGICAL SUPPLIES) ×3 IMPLANT
CATH INTERMIT  6FR 70CM (CATHETERS) ×3 IMPLANT
CLOTH BEACON ORANGE TIMEOUT ST (SAFETY) ×3 IMPLANT
COVER DOME SNAP 22 D (MISCELLANEOUS) ×3 IMPLANT
GLOVE SURG ENC MOIS LTX SZ8 (GLOVE) ×3 IMPLANT
GLOVE SURG POLYISO LF SZ7 (GLOVE) ×2 IMPLANT
GOWN STRL REUS W/TWL LRG LVL3 (GOWN DISPOSABLE) ×5 IMPLANT
GUIDEWIRE STR DUAL SENSOR (WIRE) ×2 IMPLANT
IV NS IRRIG 3000ML ARTHROMATIC (IV SOLUTION) ×3 IMPLANT
KIT TURNOVER CYSTO (KITS) ×3 IMPLANT
MANIFOLD NEPTUNE II (INSTRUMENTS) ×3 IMPLANT
NS IRRIG 500ML POUR BTL (IV SOLUTION) ×3 IMPLANT
PACK CYSTO (CUSTOM PROCEDURE TRAY) ×3 IMPLANT
SHEATH URETERAL 12FRX28CM (UROLOGICAL SUPPLIES) ×3 IMPLANT
STENT URET 6FRX26 CONTOUR (STENTS) ×3 IMPLANT
TUBE CONNECTING 12X1/4 (SUCTIONS) ×3 IMPLANT
TUBING UROLOGY SET (TUBING) ×2 IMPLANT

## 2021-04-23 NOTE — Discharge Instructions (Addendum)
You may see some blood in the urine and may have some burning with urination for 48-72 hours. You also may notice that you have to urinate more frequently or urgently after your procedure which is normal.  You should call should you develop an inability urinate, fever > 101, persistent nausea and vomiting that prevents you from eating or drinking to stay hydrated.  If you have a stent, you will likely urinate more frequently and urgently until the stent is removed and you may experience some discomfort/pain in the lower abdomen and flank especially when urinating. You may take pain medication prescribed to you if needed for pain. You may also intermittently have blood in the urine until the stent is removed.  It is okay to remove the stent by pulling the thread on Monday morning.  CYSTOSCOPY HOME CARE INSTRUCTIONS  Activity: Rest for the remainder of the day.  Do not drive or operate equipment today.  You may resume normal activities in one to two days as instructed by your physician.   Meals: Drink plenty of liquids and eat light foods such as gelatin or soup this evening.  You may return to a normal meal plan tomorrow.  Return to Work: You may return to work in one to two days or as instructed by your physician.  Special Instructions / Symptoms: Call your physician if any of these symptoms occur:   -persistent or heavy bleeding  -bleeding which continues after first few urination  -large blood clots that are difficult to pass  -urine stream diminishes or stops completely  -fever equal to or higher than 101 degrees Farenheit.  -cloudy urine with a strong, foul odor  -severe pain  Females should always wipe from front to back after elimination.  You may feel some burning pain when you urinate.  This should disappear with time.  Applying moist heat to the lower abdomen or a hot tub bath may help relieve the pain. \   Post Anesthesia Home Care Instructions  Activity: Get plenty of rest  for the remainder of the day. A responsible individual must stay with you for 24 hours following the procedure.  For the next 24 hours, DO NOT: -Drive a car -Advertising copywriter -Drink alcoholic beverages -Take any medication unless instructed by your physician -Make any legal decisions or sign important papers.  Meals: Start with liquid foods such as gelatin or soup. Progress to regular foods as tolerated. Avoid greasy, spicy, heavy foods. If nausea and/or vomiting occur, drink only clear liquids until the nausea and/or vomiting subsides. Call your physician if vomiting continues.  Special Instructions/Symptoms: Your throat may feel dry or sore from the anesthesia or the breathing tube placed in your throat during surgery. If this causes discomfort, gargle with warm salt water. The discomfort should disappear within 24 hours.
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
## 2021-04-23 NOTE — Transfer of Care (Signed)
Immediate Anesthesia Transfer of Care Note  Patient: Christian [name]  Procedure(s) Performed: Procedure(s) (LRB): CYSTOSCOPY WITH RETROGRADE PYELOGRAM, URETEROSCOPY , STONE EXTRACTION AND STENT PLACEMENT (Left)  Patient Location: PACU  Anesthesia Type: General  Level of Consciousness: awake, oriented, sedated and patient cooperative  Airway & Oxygen Therapy: Patient Spontanous Breathing and Patient connected to face mask oxygen  Post-op Assessment: Report given to PACU RN and Post -op Vital signs reviewed and stable  Post vital signs: Reviewed and stable  Complications: No apparent anesthesia complications Last Vitals:  Vitals Value Taken Time  BP    Temp    Pulse 78 04/23/21 1259  Resp 11 04/23/21 1259  SpO2 95 % 04/23/21 1259  Vitals shown include unvalidated device data.  Last Pain:  Vitals:   04/23/21 1109  TempSrc: Oral  PainSc: 0-No pain      Patients Stated Pain Goal: 6 (04/23/21 1109)  Complications: No notable events documented.

## 2021-04-23 NOTE — Op Note (Signed)
Preoperative diagnosis: Persistent left distal ureteral calculus  Postop diagnosis: Same  Principal procedure: Cystoscopy, left retrograde ureteropyelogram, fluoroscopic interpretation, left ureteroscopy, extraction of left distal ureteral stone, placement of 6 French by 26 cm contour double-J stent with tether  Surgeon: Rosemarie Galvis  Anesthesia: General with LMA  Complications: None  Specimen: Stone  Estimated blood loss: None  Drains: None  Indications: 45 year old male with persistently symptomatic, nonprogressing left distal ureteral stone.  The patient is requested management due to his symptoms.  He is not a good candidate for shockwave lithotripsy due to increased skin to stone distance.  We have discussed with him ureteroscopy.  I counseled him today regarding the procedure, expected outcomes, the need for stent.  He understands these and desires to proceed.  Findings: Urethra was normal.  Prostate nonobstructive.  Inspection of the bladder revealed normal urothelium.  Ureteral orifices were normal in location and configuration.  Retrograde study of the right ureter revealed normal caliber ureter with distal ureteral filling defect consistent with the known ureteral calculus.  Pyelocalyceal system was normal.  Description of procedure: Patient was properly identified and marked in the holding area.  He received preoperative IV antibiotics.  Was taken to the operating room where general anesthetic was administered with the LMA.  He was placed in the dorsolithotomy position.  Genitalia and perineum were prepped, draped, proper timeout performed.  21 [place] panendoscope advanced under direct vision.  Above-mentioned findings noted.  Retrograde study of the left ureter performed with a 6 [place] open-ended catheter and Omnipaque.  This revealed the distal ureteral filling defect consistent with the stone and a normal ureter and pyelocalyceal system otherwise.  Following this, sensor tip  guidewire was advanced through the open-ended catheter and into the upper pole calyceal system.  Open-ended catheter and cystoscope were removed.  I dilated the distal ureter first with the obturator and then the entire 11/13 short ureteral access catheter.  Following this, the access catheter was removed, sensor tip guidewire remained.  I then passed the 6 French dual-lumen semirigid ureteroscope.  Easily advanced into the distal ureter where the stone was encountered.  I grasped it with the engage basket, and easily extracted it.  It was safe for specimen.  Following this, ureteroscope reintroduced into the ureter and the entire ureter was inspected up to the ureteropelvic junction.  No further stone seen.  The ureteroscope was removed.  Guidewire backloaded through the cystoscope, a 26 cm x 6 French contour double-J stent with tether remaining was advanced over the guidewire, positioned appropriately.  At this point the guidewire was removed and excellent proximal and distal curls were seen.  The bladder was drained.  The scope was removed.  The tether was brought through the urethral meatus where it was tied.  It was then trimmed and taped to the penis.  The procedure was then terminated.  He was awakened, taken to the PACU in stable condition, having tolerated the procedure well

## 2021-04-23 NOTE — Anesthesia Postprocedure Evaluation (Signed)
Anesthesia Post Note  Patient: RAMAR NOBREGA  Procedure(s) Performed: CYSTOSCOPY WITH RETROGRADE PYELOGRAM, URETEROSCOPY , STONE EXTRACTION AND STENT PLACEMENT (Left: Ureter)     Patient location during evaluation: PACU Anesthesia Type: General Level of consciousness: awake and alert Pain management: pain level controlled Vital Signs Assessment: post-procedure vital signs reviewed and stable Respiratory status: spontaneous breathing, nonlabored ventilation, respiratory function stable and patient connected to nasal cannula oxygen Cardiovascular status: blood pressure returned to baseline and stable Postop Assessment: no apparent nausea or vomiting Anesthetic complications: no   No notable events documented.  Last Vitals:  Vitals:   04/23/21 1315 04/23/21 1350  BP: 119/72 117/77  Pulse: 77 74  Resp: 12 16  Temp: (!) 36.3 C (!) 36.4 C  SpO2: 95% 99%    Last Pain:  Vitals:   04/23/21 1350  TempSrc: Oral  PainSc:                  Shelton Silvas

## 2021-04-23 NOTE — Anesthesia Preprocedure Evaluation (Addendum)
Anesthesia Evaluation  Patient identified by MRN, date of birth, ID band Patient awake    Reviewed: Allergy & Precautions, NPO status , Patient's Chart, lab work & pertinent test results  Airway Mallampati: II  TM Distance: >3 FB Neck ROM: Full    Dental  (+) Teeth Intact, Dental Advisory Given   Pulmonary sleep apnea ,    breath sounds clear to auscultation       Cardiovascular hypertension, Pt. on medications  Rhythm:Regular Rate:Normal     Neuro/Psych  Headaches, PSYCHIATRIC DISORDERS Depression    GI/Hepatic negative GI ROS, Neg liver ROS,   Endo/Other  negative endocrine ROS  Renal/GU negative Renal ROS     Musculoskeletal  (+) Arthritis ,   Abdominal (+) + obese,   Peds  Hematology negative hematology ROS (+)   Anesthesia Other Findings   Reproductive/Obstetrics                            Anesthesia Physical Anesthesia Plan  ASA: 3  Anesthesia Plan: General   Post-op Pain Management:    Induction: Intravenous  PONV Risk Score and Plan: 3 and Ondansetron, Dexamethasone and Midazolam  Airway Management Planned: LMA  Additional Equipment: None  Intra-op Plan:   Post-operative Plan: Extubation in OR  Informed Consent: I have reviewed the patients History and Physical, chart, labs and discussed the procedure including the risks, benefits and alternatives for the proposed anesthesia with the patient or authorized representative who has indicated his/her understanding and acceptance.     Dental advisory given  Plan Discussed with: CRNA  Anesthesia Plan Comments:        Anesthesia Quick Evaluation

## 2021-04-23 NOTE — Anesthesia Procedure Notes (Signed)
Procedure Name: LMA Insertion Date/Time: 04/23/2021 12:28 PM Performed by: Francie Massing, CRNA Pre-anesthesia Checklist: Patient identified, Emergency Drugs available, Suction available and Patient being monitored Patient Re-evaluated:Patient Re-evaluated prior to induction Oxygen Delivery Method: Circle system utilized Preoxygenation: Pre-oxygenation with 100% oxygen Induction Type: IV induction Ventilation: Mask ventilation without difficulty LMA: LMA inserted LMA Size: 4.0 Number of attempts: 1 Airway Equipment and Method: Bite block Placement Confirmation: positive ETCO2 Tube secured with: Tape Dental Injury: Teeth and Oropharynx as per pre-operative assessment

## 2021-04-26 ENCOUNTER — Encounter (HOSPITAL_BASED_OUTPATIENT_CLINIC_OR_DEPARTMENT_OTHER): Payer: Self-pay | Admitting: Urology

## 2021-04-28 ENCOUNTER — Ambulatory Visit (INDEPENDENT_AMBULATORY_CARE_PROVIDER_SITE_OTHER): Payer: BC Managed Care – PPO | Admitting: Mental Health

## 2021-04-28 ENCOUNTER — Other Ambulatory Visit: Payer: Self-pay

## 2021-04-28 DIAGNOSIS — F331 Major depressive disorder, recurrent, moderate: Secondary | ICD-10-CM
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
## 2021-05-21 ENCOUNTER — Ambulatory Visit: Payer: BC Managed Care – PPO | Admitting: Mental Health

## 2021-06-01 DIAGNOSIS — E785 Hyperlipidemia, unspecified: Secondary | ICD-10-CM | POA: Diagnosis not present

## 2021-06-01 DIAGNOSIS — Z125 Encounter for screening for malignant neoplasm of prostate: Secondary | ICD-10-CM | POA: Diagnosis not present

## 2021-06-02 ENCOUNTER — Other Ambulatory Visit: Payer: Self-pay

## 2021-06-02 ENCOUNTER — Ambulatory Visit (INDEPENDENT_AMBULATORY_CARE_PROVIDER_SITE_OTHER): Payer: BC Managed Care – PPO | Admitting: Mental Health

## 2021-06-02 DIAGNOSIS — F331 Major depressive disorder, recurrent, moderate: Secondary | ICD-10-CM | POA: Diagnosis not present

## 2021-06-02 NOTE — Progress Notes (Signed)
[facility] Psychotherapy Note  Name: Christian [name] Date: 06/02/21 MRN: [id number] DOB: January 20, 1976 PCP: Cleatis Polka., MD  Time spent: 58 minutes  Treatment: Individual Therapy  Mental Status Exam:    Appearance:   Casual     Behavior:  Appropriate  Motor:  Normal  Speech/Language:   Clear and Coherent    Affect:  Full range  Mood:  Pleasant, sad  Thought process:  normal  Thought content:    WNL  Sensory/Perceptual disturbances:    WNL  Orientation:  x4  Attention:  Good  Concentration:  Good  Memory:  intact  Fund of knowledge:   WNL  Insight:    good  Judgment:   Good  Impulse Control:  Good   Reported Symptoms:  Irritable, depressed, anxiety, anger outbursts (verbal), tearful, feelings of inadequacy  Risk Assessment:  Danger to Self:  No Self-injurious Behavior: No Danger to Others: No Duty to Warn:no Physical Aggression / Violence:No  Access to Firearms a concern: No  Gang Involvement:No  Patient / guardian was educated about steps to take if suicide or homicide risk level increases between visits: yes While future psychiatric events cannot be accurately predicted, the patient does not currently require acute inpatient psychiatric care and does not currently meet [facility] involuntary commitment criteria.  Subjective:  Patient presents for today's session progress.  Patient shared with some detail experiences over the Christmas holiday with family.  He stated they followed through with their 2-week cruise vacation together.  He went on to share the challenges that ensued, how his wife became upset, tearful and frustrated that their children were spending more time with friends they have been on this ship at times as opposed to with them.  Getting upset in moments, leaving patient at various events while on the vacation in frustration due to their not having enough "family time".  Patient shared his attempts to problem solve, talking with his  children about being more mindful of spending time with them, their mother, attempting to set some limits with them.  He shared how he verbalized at the end of the vacation that he does not want to take another cruise around the holidays again due to the events that had transpired.  Through guided discovery, he identified said feelings, questioning whether his wife really wants to be married and be a part of his life or is she just staying for his income.  Patient stated he questions this due to her multiple statements about wanting to leave and separate over the past few years.  He went on to share other interpersonal stressors, is concerned about his having short-term memory loss, and increasing tendency toward getting irritable and upset.  He plans to talk with his PCP further about the concerns.  He also stated that he plans to start taking the Lexapro that was prescribed; we encouraged him to also schedule another appointment with Yvette Rack, NP at our practice for follow-up.   Intervention: supportive therapy, motivational interviewing, motivational interviewing   Diagnoses:    ICD-10-CM   1. Major depressive disorder, recurrent episode, moderate (HCC)  F33.1          Plan: Patient is to use CBT, mindfulness and coping skills to help manage decrease symptoms associated with their diagnosis.  Patient to follow through with coping skills discussed.  Patient to follow through with engaging in some pleasurable interests for himself, making time to de- stress.   Patient to take opportunities to verbally  affirm his wife when he feels a sense of support from her to improve their communication.  Long-term goal:    Reduce overall level, frequency, and intensity of the feelings of depression, anxiety and panic evidenced by decreased irritability, negative self talk  from 6 to 7 days/week to 0 to 1 days/week per client report for at least 3 consecutive months.    Short-term goal:  Verbally  express understanding of the relationship between feelings of depression, anxiety and their impact on thinking patterns and behaviors. Verbalize an understanding of the role that distorted thinking plays in creating fears, excessive worry, and ruminations. Improve effective communication skills to reduce relational stress Decrease irritability frequency and anger outbursts  Progress: Progressing  [name] Session, [facility]

## 2021-06-03 DIAGNOSIS — G43709 Chronic migraine without aura, not intractable, without status migrainosus: Secondary | ICD-10-CM | POA: Diagnosis not present

## 2021-06-03 DIAGNOSIS — Z6841 Body Mass Index (BMI) 40.0 and over, adult: Secondary | ICD-10-CM | POA: Diagnosis not present

## 2021-06-03 DIAGNOSIS — F419 Anxiety disorder, unspecified: Secondary | ICD-10-CM | POA: Diagnosis not present

## 2021-06-08 DIAGNOSIS — Z1339 Encounter for screening examination for other mental health and behavioral disorders: Secondary | ICD-10-CM | POA: Diagnosis not present

## 2021-06-08 DIAGNOSIS — R7301 Impaired fasting glucose: Secondary | ICD-10-CM | POA: Diagnosis not present

## 2021-06-08 DIAGNOSIS — Z0000 Encounter for general adult medical examination without abnormal findings: Secondary | ICD-10-CM | POA: Diagnosis not present

## 2021-06-08 DIAGNOSIS — I10 Essential (primary) hypertension: Secondary | ICD-10-CM | POA: Diagnosis not present

## 2021-06-08 DIAGNOSIS — Z1331 Encounter for screening for depression: Secondary | ICD-10-CM | POA: Diagnosis not present

## 2021-06-09 ENCOUNTER — Other Ambulatory Visit: Payer: Self-pay | Admitting: Internal Medicine

## 2021-06-09 ENCOUNTER — Other Ambulatory Visit (HOSPITAL_COMMUNITY): Payer: Self-pay

## 2021-06-09 DIAGNOSIS — E785 Hyperlipidemia, unspecified: Secondary | ICD-10-CM

## 2021-06-09 MED ORDER — SEMAGLUTIDE-WEIGHT MANAGEMENT 1.7 MG/0.75ML ~~LOC~~ SOAJ
1.7000 mg | SUBCUTANEOUS | 0 refills | Status: DC
  Filled 2021-06-09 – 2021-08-26 (×2): qty 3, 28d supply, fill #0

## 2021-06-09 MED ORDER — SEMAGLUTIDE-WEIGHT MANAGEMENT 1 MG/0.5ML ~~LOC~~ SOAJ
1.0000 mg | SUBCUTANEOUS | 0 refills | Status: DC
  Filled 2021-06-09 – 2021-07-29 (×2): qty 2, 28d supply, fill #0

## 2021-06-09 MED ORDER — SEMAGLUTIDE-WEIGHT MANAGEMENT 0.5 MG/0.5ML ~~LOC~~ SOAJ
0.5000 mg | SUBCUTANEOUS | 0 refills | Status: DC
  Filled 2021-06-09 – 2021-07-01 (×2): qty 2, 28d supply, fill #0

## 2021-06-23 ENCOUNTER — Other Ambulatory Visit: Payer: Self-pay

## 2021-06-23 ENCOUNTER — Ambulatory Visit (INDEPENDENT_AMBULATORY_CARE_PROVIDER_SITE_OTHER): Payer: BC Managed Care – PPO | Admitting: Mental Health

## 2021-06-23 DIAGNOSIS — F331 Major depressive disorder, recurrent, moderate: Secondary | ICD-10-CM

## 2021-06-23 NOTE — Progress Notes (Signed)
[facility] Psychotherapy Note  Name: Christian [name] Date: 06/23/21 MRN: [id number] DOB: 07/18/1975 PCP: Cleatis Polka., MD  Time spent: 56 minutes  Treatment: Individual Therapy  Mental Status Exam:    Appearance:   Casual     Behavior:  Appropriate  Motor:  Normal  Speech/Language:   Clear and Coherent    Affect:  Full range  Mood:  Pleasant, sad  Thought process:  normal  Thought content:    WNL  Sensory/Perceptual disturbances:    WNL  Orientation:  x4  Attention:  Good  Concentration:  Good  Memory:  intact  Fund of knowledge:   WNL  Insight:    good  Judgment:   Good  Impulse Control:  Good   Reported Symptoms:  Irritable, depressed, anxiety, anger outbursts (verbal), tearful, feelings of inadequacy  Risk Assessment:  Danger to Self:  No Self-injurious Behavior: No Danger to Others: No Duty to Warn:no Physical Aggression / Violence:No  Access to Firearms a concern: No  Gang Involvement:No  Patient / guardian was educated about steps to take if suicide or homicide risk level increases between visits: yes While future psychiatric events cannot be accurately predicted, the patient does not currently require acute inpatient psychiatric care and does not currently meet [facility] involuntary commitment criteria.  Subjective:  Patient presents for today's session on time.  Assess progress where he stated he continues to have marital strain going on to give details related to he and his wife's recent discussions.  He stated that this past Monday she mentioned the idea of separating again.  He stated they have has improvements in the relationship over the past couple of days, less strained conversations.  Provided support and understanding as he processed feelings, and thoughts related to his wife making comments to him about being lazy.  Patient stated that he will complete his work day on his laptop often sitting in the living room, likes to have  background noise such as the TV which helps him concentrate, but ultimately is upsetting to his wife as she stated informed him of such.  Patient stated that he often is highly engaged with day-to-day activities and needs for the family, taking them to school, various practices going to the grocery store, and cooking.  He stated that his wife will make negative comments about what he is not gotten done around the house, while he is also trying to manage his full-time job duties.  He said his wife works 1 to 2 days/week about 5 or 6 hours per shift.  He stated that they have hired a Pensions consultant that comes once a month and also a cleaning lady has been hired to help his wife keep up with some household responsibilities throughout the week such as folding clothes etc.  Patient stated he has tried to be less reactive with irritability and anger during these discussions with his wife, how also his medication he feels has been helpful as he started about a month ago.   Intervention: supportive therapy, motivational interviewing, motivational interviewing   Diagnoses:    ICD-10-CM   1. Major depressive disorder, recurrent episode, moderate (HCC)  F33.1        Plan: Patient is to use CBT, mindfulness and coping skills to help manage decrease symptoms associated with their diagnosis.  Patient to follow through with coping skills discussed.  Patient to follow through with engaging in some pleasurable interests for himself, making time to de- stress.  Patient to take opportunities to verbally affirm his wife when he feels a sense of support from her to improve their communication.  Long-term goal:    Reduce overall level, frequency, and intensity of the feelings of depression, anxiety and panic evidenced by decreased irritability, negative self talk  from 6 to 7 days/week to 0 to 1 days/week per client report for at least 3 consecutive months.    Short-term goal:  Verbally express understanding of the  relationship between feelings of depression, anxiety and their impact on thinking patterns and behaviors. Verbalize an understanding of the role that distorted thinking plays in creating fears, excessive worry, and ruminations. Improve effective communication skills to reduce relational stress Decrease irritability frequency and anger outbursts  Progress: Progressing  [place], [facility]

## 2021-06-29 ENCOUNTER — Ambulatory Visit: Payer: BC Managed Care – PPO | Admitting: Mental Health

## 2021-06-30 DIAGNOSIS — I10 Essential (primary) hypertension: Secondary | ICD-10-CM | POA: Diagnosis not present

## 2021-06-30 DIAGNOSIS — G4733 Obstructive sleep apnea (adult) (pediatric): Secondary | ICD-10-CM | POA: Diagnosis not present

## 2021-06-30 DIAGNOSIS — G43009 Migraine without aura, not intractable, without status migrainosus: Secondary | ICD-10-CM | POA: Diagnosis not present

## 2021-07-01 ENCOUNTER — Other Ambulatory Visit (HOSPITAL_COMMUNITY): Payer: Self-pay

## 2021-07-06 ENCOUNTER — Other Ambulatory Visit (HOSPITAL_COMMUNITY): Payer: Self-pay

## 2021-07-07 ENCOUNTER — Ambulatory Visit
Admission: RE | Admit: 2021-07-07 | Discharge: 2021-07-07 | Disposition: A | Discharge status: Home / Self Care | Payer: No Typology Code available for payment source | Source: Ambulatory Visit | Attending: Internal Medicine | Admitting: Internal Medicine

## 2021-07-07 DIAGNOSIS — E785 Hyperlipidemia, unspecified: Secondary | ICD-10-CM | POA: Diagnosis not present

## 2021-07-14 ENCOUNTER — Other Ambulatory Visit (HOSPITAL_COMMUNITY): Payer: Self-pay

## 2021-07-15 ENCOUNTER — Other Ambulatory Visit: Payer: Self-pay

## 2021-07-15 ENCOUNTER — Ambulatory Visit (INDEPENDENT_AMBULATORY_CARE_PROVIDER_SITE_OTHER): Payer: BC Managed Care – PPO | Admitting: Mental Health

## 2021-07-15 DIAGNOSIS — F331 Major depressive disorder, recurrent, moderate: Secondary | ICD-10-CM | POA: Diagnosis not present

## 2021-07-15 NOTE — Progress Notes (Signed)
[facility] Psychotherapy Note ? ?Name: Christian [name] ?Date: 07/15/21 ?MRN: [id number] ?DOB: 10/05/1975 ?PCP: Ginger Organ., MD ? ?Time spent: 54 minutes ? ?Treatment: Individual Therapy ? ?Mental Status Exam: ?  ? ?Appearance:   Casual     ?Behavior:  Appropriate  ?Motor:  Normal  ?Speech/Language:   Clear and Coherent    ?Affect:  Full range  ?Mood:  Pleasant, sad  ?Thought process:  normal  ?Thought content:    WNL  ?Sensory/Perceptual disturbances:    WNL  ?Orientation:  x4  ?Attention:  Good  ?Concentration:  Good  ?Memory:  intact  ?Fund of knowledge:   WNL  ?Insight:    good  ?Judgment:   Good  ?Impulse Control:  Good  ? ?Reported Symptoms:  Irritable, depressed, anxiety, anger outbursts (verbal), tearful, feelings of inadequacy ? ?Risk Assessment:  ?Danger to Self:  No ?Self-injurious Behavior: No ?Danger to Others: No ?Duty to Warn:no ?Physical Aggression / Violence:No  ?Access to Firearms a concern: No  ?Gang Involvement:No  ?Patient / guardian was educated about steps to take if suicide or homicide risk level increases between visits: yes ?While future psychiatric events cannot be accurately predicted, the patient does not currently require acute inpatient psychiatric care and does not currently meet [facility] involuntary commitment criteria. ? ?Subjective:  ?Patient presents for today's session on time.  He shared recent events in progress.  Continues to focus on family relationship issues, primarily marital issues between him and his wife that have continued to persist.  He stated that he is trying to make more of an effort to support his wife, evidenced by over a past weekend keeping up with many household responsibilities, cooking, cleaning etc.  He stated that when she around time with her daughter after a weekend volleyball tournament, she became upset and secluded herself to her room due to her daughter not sharing some food that was prepared with her.  Patient stated he  confronted his wife about the situation, mentioning how he had made all the efforts over the weekend but that she still "found something to get upset over".  He stated she complains often about having to care for their daughter, who has special needs.  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  He stated that he spoke to his wife about getting a new vehicle as this is something he has prepared for for the last 2 years.  He stated that she was critical of the decision on the he recently purchased her vehicle about a year or so ago.  He stated she also is not supportive when other positive things can occur such as recently when he got a raise at work.  He stated he continues to take his medication, feels it is effective and not being reactive, less distressed in situations.  He identified how he tries to be mindful of how he communicates, tries to avoid raising his voice etc. ? ? ? ?Intervention: supportive therapy, motivational interviewing, motivational interviewing ? ? ?Diagnoses:  ?  ICD-10-CM   ?1. Major depressive disorder, recurrent episode, moderate (HCC)  F33.1   ?  ? ? ? ? ?Plan: Patient is to use CBT, mindfulness and coping skills to help manage decrease symptoms associated with their diagnosis.  Patient to follow through with coping skills discussed.  Patient to follow through  with engaging in some pleasurable interests for himself, making time to de- stress.   Patient to continue to be mindful of his communication to assist in improving relationships. ? ?Long-term goal:    ?Reduce overall level, frequency, and intensity of the feelings of depression, anxiety and panic evidenced by decreased irritability, negative self talk  from 6 to 7 days/week to 0 to 1 days/week per client report for at least 3 consecutive months.   ? ?Short-term goal:  ?Verbally express understanding of the  relationship between feelings of depression, anxiety and their impact on thinking patterns and behaviors. ?Verbalize an understanding of the role that distorted thinking plays in creating fears, excessive worry, and ruminations. ?Improve effective communication skills to reduce relational stress ?Decrease irritability frequency and anger outbursts ? ?Progress: Progressing ? ?Anson [place], [facility]                     ?

## 2021-07-29 ENCOUNTER — Other Ambulatory Visit (HOSPITAL_COMMUNITY): Payer: Self-pay

## 2021-08-02 ENCOUNTER — Other Ambulatory Visit: Payer: Self-pay

## 2021-08-02 ENCOUNTER — Ambulatory Visit (INDEPENDENT_AMBULATORY_CARE_PROVIDER_SITE_OTHER): Payer: BC Managed Care – PPO | Admitting: Mental Health

## 2021-08-02 DIAGNOSIS — F331 Major depressive disorder, recurrent, moderate: Secondary | ICD-10-CM

## 2021-08-02 NOTE — Progress Notes (Signed)
[facility] Psychotherapy Note ? ?Name: Christian [name] ?Date: 08/02/21 ?MRN: [id number] ?DOB: [birth year] ?PCP: Cleatis Polka., MD ? ?Time spent: 55 minutes ? ?Treatment: Individual Therapy ? ?Mental Status Exam: ?  ? ?Appearance:   Casual     ?Behavior:  Appropriate  ?Motor:  Normal  ?Speech/Language:   Clear and Coherent    ?Affect:  Full range  ?Mood:  Pleasant, sad  ?Thought process:  normal  ?Thought content:    WNL  ?Sensory/Perceptual disturbances:    WNL  ?Orientation:  x4  ?Attention:  Good  ?Concentration:  Good  ?Memory:  intact  ?Fund of knowledge:   WNL  ?Insight:    good  ?Judgment:   Good  ?Impulse Control:  Good  ? ?Reported Symptoms:  Irritable, depressed, anxiety, anger outbursts (verbal), tearful, feelings of inadequacy ? ?Risk Assessment:  ?Danger to Self:  No ?Self-injurious Behavior: No ?Danger to Others: No ?Duty to Warn:no ?Physical Aggression / Violence:No  ?Access to Firearms a concern: No  ?Gang Involvement:No  ?Patient / guardian was educated about steps to take if suicide or homicide risk level increases between visits: yes ?While future psychiatric events cannot be accurately predicted, the patient does not currently require acute inpatient psychiatric care and does not currently meet [facility] involuntary commitment criteria. ? ?Subjective:  ?Patient presents for today's session on time.  He shared recent events related to the ongoing marital strain experienced since last session.  He shared how he and his wife got into an disagreement regarding their daughter, he stated he was trying to go home from one of her volleyball tournaments as early as possible due to waking up at 5 AM.  He stated he went to bed the night before around midnight waiting for his son to get him.  He stated that his wife arrived and wanted to leave and when he made the request to leave himself she was upset and remained so for several hours.  In an attempt to further understand and communicate, he  stated that they were able to talk later that evening where his wife expressed her feelings related to the stress of trying to keep up with their daughter's special needs as she has some developmental delays.  Patient stated he was able to communicate with his wife however, he stated she needs considerably more time to calm down when upset.  He stated they plan to spend time with her family over the summer for approximately 2 months.  He continues to work on his communication in the relationship effectively. ? ? ?Intervention: supportive therapy, motivational interviewing, motivational interviewing ? ? ?Diagnoses:  ?  ICD-10-CM   ?1. Major depressive disorder, recurrent episode, moderate (HCC)  F33.1   ?  ? ? ? ? ? ?Plan: Patient is to use CBT, mindfulness and coping skills to help manage decrease symptoms associated with their diagnosis.  Patient to follow through with coping skills discussed.  Patient to follow through with engaging in some pleasurable interests for himself, making time to de- stress.   Patient to continue to be mindful of his communication to assist in improving relationships. ? ?Long-term goal:    ?Reduce overall level, frequency, and intensity of the feelings of depression, anxiety and panic evidenced by decreased irritability, negative self talk  from 6 to 7 days/week to 0 to 1 days/week per client report for at least 3 consecutive months.   ? ?Short-term goal:  ?Verbally express understanding of the relationship between feelings of  depression, anxiety and their impact on thinking patterns and behaviors. ?Verbalize an understanding of the role that distorted thinking plays in creating fears, excessive worry, and ruminations. ?Improve effective communication skills to reduce relational stress ?Decrease irritability frequency and anger outbursts ? ?Progress: Progressing ? ?[name] Session, [facility]                     ?

## 2021-08-04 ENCOUNTER — Other Ambulatory Visit (HOSPITAL_COMMUNITY): Payer: Self-pay

## 2021-08-16 ENCOUNTER — Ambulatory Visit (INDEPENDENT_AMBULATORY_CARE_PROVIDER_SITE_OTHER): Payer: BC Managed Care – PPO | Admitting: Mental Health

## 2021-08-16 DIAGNOSIS — F331 Major depressive disorder, recurrent, moderate: Secondary | ICD-10-CM

## 2021-08-16 NOTE — Progress Notes (Signed)
[facility] Psychotherapy Note ? ?Name: Christian [name] ?Date: 08/16/21 ?MRN: [id number] ?DOB: 25-Feb-1976 ?PCP: Ginger Organ., MD ? ?Time spent: 57 minutes ? ?Treatment: Individual Therapy ? ?Mental Status Exam: ?  ? ?Appearance:   Casual     ?Behavior:  Appropriate  ?Motor:  Normal  ?Speech/Language:   Clear and Coherent    ?Affect:  Full range  ?Mood:  Pleasant, sad  ?Thought process:  normal  ?Thought content:    WNL  ?Sensory/Perceptual disturbances:    WNL  ?Orientation:  x4  ?Attention:  Good  ?Concentration:  Good  ?Memory:  intact  ?Fund of knowledge:   WNL  ?Insight:    good  ?Judgment:   Good  ?Impulse Control:  Good  ? ?Reported Symptoms:  Irritable, depressed, anxiety, anger outbursts (verbal), tearful, feelings of inadequacy ? ?Risk Assessment:  ?Danger to Self:  No ?Self-injurious Behavior: No ?Danger to Others: No ?Duty to Warn:no ?Physical Aggression / Violence:No  ?Access to Firearms a concern: No  ?Gang Involvement:No  ?Patient / guardian was educated about steps to take if suicide or homicide risk level increases between visits: yes ?While future psychiatric events cannot be accurately predicted, the patient does not currently require acute inpatient psychiatric care and does not currently meet [facility] involuntary commitment criteria. ? ?Subjective:  ?Patient presents for today's session on time.  Patient shared recent progress, how they continue to have busy schedules at home, patient and his wife attending their daughters volleyball tournament games. He stated that based on their schedules, he found himself at home taking a son to another event while also tending to their two dogs. He stated that he and his family are coping with the loss of one of their dogs, due to an accident that occurred about a week ago. How it affected family was shared, his youngest son taking it very hard, needing some space while also needing some support. Patient stated that he's been at work more  on-site due to needing to get away from the home as he often would sit with the dog while working. He said that him and his wife have had less relational strain overall, sharing some details other than the obvious toll of losing their pet has taken on everyone in the family. He stated He's considering restarting his medication, went on to share how he has been more irritable and reactive when distressed more recently. Shared examples, explored ways to cope in these situations allowing him to have needed down time to calm down and some situations when possible. Giving himself time to consider how to cope in these situations proactively is his plan. ? ? ? ? ?Intervention: supportive therapy, motivational interviewing, motivational interviewing ? ? ?Diagnoses:  ?  ICD-10-CM   ?1. Major depressive disorder, recurrent episode, moderate (HCC)  F33.1   ?  ? ? ? ? ? ? ?Plan: Patient is to use CBT, mindfulness and coping skills to help manage decrease symptoms associated with their diagnosis.  Patient to follow through with coping skills discussed.  Patient to follow through with engaging in some pleasurable interests for himself, making time to de- stress.   Patient to continue to be mindful of his communication to assist in improving relationships. ? ?Long-term goal:    ?Reduce overall level, frequency, and intensity of the feelings of depression, anxiety and panic evidenced by decreased irritability, negative self talk  from 6 to 7 days/week to 0 to 1 days/week per client report for at  least 3 consecutive months.   ? ?Short-term goal:  ?Verbally express understanding of the relationship between feelings of depression, anxiety and their impact on thinking patterns and behaviors. ?Verbalize an understanding of the role that distorted thinking plays in creating fears, excessive worry, and ruminations. ?Improve effective communication skills to reduce relational stress ?Decrease irritability frequency and anger  outbursts ? ?Progress: Progressing ? ?Anson [place], [facility]                     ?

## 2021-08-25 ENCOUNTER — Encounter: Payer: Self-pay | Admitting: Adult Health

## 2021-08-25 ENCOUNTER — Ambulatory Visit: Payer: BC Managed Care – PPO | Admitting: Adult Health

## 2021-08-25 DIAGNOSIS — F411 Generalized anxiety disorder: Secondary | ICD-10-CM

## 2021-08-25 DIAGNOSIS — F4323 Adjustment disorder with mixed anxiety and depressed mood: Secondary | ICD-10-CM | POA: Diagnosis not present

## 2021-08-25 MED ORDER — DESVENLAFAXINE SUCCINATE ER 50 MG PO TB24: 50.0000 mg | ORAL_TABLET | Freq: Every day | ORAL | 2 refills | Status: DC

## 2021-08-25 NOTE — Progress Notes (Signed)
Christian [name] ?[id number] ?1975-10-18 ?46 y.o. ? ?Subjective:  ? ?Patient ID:  Christian [name] is a 46 y.o. (DOB March 29, 1976) male. ? ?Chief Complaint: No chief complaint on file. ? ? ?HPI ?Christian [name] presents to the office today for follow-up of adjustment disorder. ? ?Describes mood today as "so-so". Pleasant. Denies tearfulness. Mood symptoms - reports increased irritability. Denies depression and anxiety. Denies panic attacks. Stating "I feel like I'm doing ok, except for my temper". Improved interest and motivation. Taking medications as prescribed.  ?Energy levels stable. Active, does not have a regular exercise routine.  ?Enjoys some usual interests and activities. Married. Lives with wife and 2 children - 1 child away at college.Spending time with family. ?Appetite suppression. Weight loss 15 pounds - 260 pounds. ?Sleeping better some nights than others. Averages 5.5 hours. Denies daytime napping. Has a sleep study scheduled. ?Focus and concentration stable. Completing tasks. Managing aspects of household. 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   ?Denies SI or HI.  ?Denies AH or VH. ? ?Previous medication trials: Qysmia ? ? ?Flowsheet Row Admission (Discharged) from 04/23/2021 in [facility]  ?C-SSRS RISK CATEGORY No Risk  ? ?  ?  ? ?Review of Systems:  ?Review of Systems  ?Musculoskeletal:  Negative for gait problem.  ?Neurological:  Negative for tremors.  ?Psychiatric/Behavioral:    ?     Please refer to HPI  ? ?Medications: I have reviewed the patient's current medications. ? ?Current Outpatient Medications  ?Medication Sig Dispense Refill  ? desvenlafaxine (PRISTIQ) 50 MG 24 hr tablet Take 1 tablet (50 mg total) by mouth daily. 30 tablet 2  ? baclofen (LIORESAL) 10 MG tablet Take 10 mg by mouth 3 (three) times daily as needed. Patient takes once daily at bedtime prn.    ? clorazepate (TRANXENE) 3.75 MG tablet Take 1 tablet (3.75 mg total) by mouth 2 (two) times daily as needed for anxiety.  (Patient taking differently: Take 3.75 mg by mouth 2 (two) times daily as needed for anxiety. Rarely takes) 30 tablet 3  ? lisinopril-hydrochlorothiazide (ZESTORETIC) 10-12.5 MG tablet Take 1 tablet by mouth at bedtime.    ? oxybutynin (DITROPAN) 5 MG tablet Take 1 tablet (5 mg total) by mouth every 8 (eight) hours as needed for bladder spasms. 15 tablet 1  ? oxyCODONE (ROXICODONE) 5 MG immediate release tablet Take 1 tablet (5 mg total) by mouth every 8 (eight) hours as needed. 5 tablet 0  ? rizatriptan (MAXALT) 10 MG tablet Take 10 mg by mouth as needed.    ? rosuvastatin (CRESTOR) 10 MG tablet Take 10 mg by mouth at bedtime.    ? Semaglutide-Weight Management 0.5 MG/0.5ML SOAJ Inject 0.5 mg into the skin once a week. 2 mL 0  ? Semaglutide-Weight Management 1 MG/0.5ML SOAJ Inject 1 mg into the skin once a week. 2 mL 0  ? Semaglutide-Weight Management 1.7 MG/0.75ML SOAJ Inject 1.7 mg into the skin once a week. 3 mL 0  ? TROKENDI XR 200 MG CP24 Take 1 capsule by mouth at bedtime.    ? ?No current facility-administered medications for this visit.  ? ? ?Medication Side Effects: None ? ?Allergies: No Known Allergies ? ?Past Medical History:  ?Diagnosis Date  ? Abdominal mass 2022  ? Pt had an abdominal mass abcess treated with antibiotics. Mass reoccurred. Surgery was done to remove abcess on 04/06/21 by Dr. Michael Boston.  ? Depression   ? Only during Covid. Pt is better and off  all medication as of 04/23/2019.  ? Family history of adverse reaction to anesthesia   ? Pt stated that his mother has had severe N & V after anesthesia. He has never had a problem per pt.  ? Hypertension   ? Meningitis   ? spinal meningitis 1980  ? Metabolic syndrome   ? Migraine headache   ? hx of chronic migraines  ? Obesity   ? Other intervertebral disc degeneration, thoracic region   ? Patient has back pain due to an old injury. He received an epidural injection in his back on 04/22/21.  ? Prediabetes   ? last blood drawn in 01/2021 showed  prediabetes per pt  ? Sleep apnea   ? Patient was diagnosed with sleep apnea from a 02/04/2019 home study. He was unable to tolerate a CPAP.  ? Snoring   ? Wears glasses   ? ? ?Past Medical History, Surgical history, Social history, and Family history were reviewed and updated as appropriate.  ? ?Please see review of systems for further details on the patient's review from today.  ? ?Objective:  ? ?Physical Exam:  ?There were no vitals taken for this visit. ? ?Physical Exam ? ?Lab Review:  ?   ?Component Value Date/Time  ? NA 139 04/23/2021 1128  ? K 3.1 (L) 04/23/2021 1128  ? CL 106 04/23/2021 1128  ? CO2 25 04/19/2014 1152  ? GLUCOSE 118 (H) 04/23/2021 1128  ? BUN 23 (H) 04/23/2021 1128  ? CREATININE 0.80 04/23/2021 1128  ? CALCIUM 9.4 04/19/2014 1152  ? PROT 8.0 04/19/2014 1152  ? ALBUMIN 4.1 04/19/2014 1152  ? AST 18 04/19/2014 1152  ? ALT 23 04/19/2014 1152  ? ALKPHOS 92 04/19/2014 1152  ? BILITOT 0.3 04/19/2014 1152  ? GFRNONAA >90 04/19/2014 1152  ? GFRAA >90 04/19/2014 1152  ? ? ?   ?Component Value Date/Time  ? WBC 7.0 04/19/2014 1152  ? RBC 5.38 04/19/2014 1152  ? HGB 16.3 04/23/2021 1128  ? HGB 14.8 11/25/2008 1520  ? HCT 48.0 04/23/2021 1128  ? HCT 43.3 11/25/2008 1520  ? PLT 152 04/19/2014 1152  ? PLT 196 11/25/2008 1520  ? MCV 87.2 04/19/2014 1152  ? MCV 87.8 11/25/2008 1520  ? MCH 29.4 04/19/2014 1152  ? MCHC 33.7 04/19/2014 1152  ? RDW 13.0 04/19/2014 1152  ? RDW 13.8 11/25/2008 1520  ? LYMPHSABS 1.4 04/19/2014 1152  ? LYMPHSABS 2.0 11/25/2008 1520  ? MONOABS 1.0 04/19/2014 1152  ? MONOABS 0.7 11/25/2008 1520  ? EOSABS 0.1 04/19/2014 1152  ? EOSABS 0.1 11/25/2008 1520  ? BASOSABS 0.0 04/19/2014 1152  ? BASOSABS 0.1 11/25/2008 1520  ? ? ?No results found for: POCLITH, LITHIUM  ? ?No results found for: PHENYTOIN, PHENOBARB, VALPROATE, CBMZ  ? ?.res ?Assessment: Plan:   ? ?Plan: ? ?PDMP reviewed ?  ?Add Pristiq 50mg  daily ? ?RTC 4 weeks ? ?Patient advised to contact office with any questions, adverse  effects, or acute worsening in signs and symptoms. ? ?Time spent with patient was 25 minutes. Greater than 50% of face to face time with patient was spent on counseling and coordination of care.   ? ?Discussed potential benefits, risk, and side effects of benzodiazepines to include potential risk of tolerance and dependence, as well as possible drowsiness.  Advised patient not to drive if experiencing drowsiness and to take lowest possible effective dose to minimize risk of dependence and tolerance. ? ?Diagnoses and all orders for this visit: ? ?Generalized anxiety  disorder ?-     desvenlafaxine (PRISTIQ) 50 MG 24 hr tablet; Take 1 tablet (50 mg total) by mouth daily. ? ?Adjustment disorder with mixed anxiety and depressed mood ? ?  ? ?Please see After Visit Summary for patient specific instructions. ? ?Future Appointments  ?Date Time Provider [place]  ?08/31/2021 10:00 AM Anson [place], [facility] None  ?09/13/2021 10:00 AM Anson [place], [facility] CP-CP None  ?09/28/2021  9:00 AM Anson [place], [facility] None  ? ? ?No orders of the defined types were placed in this encounter. ? ? ?------------------------------- ?

## 2021-08-26 ENCOUNTER — Other Ambulatory Visit (HOSPITAL_COMMUNITY): Payer: Self-pay

## 2021-08-31 ENCOUNTER — Other Ambulatory Visit (HOSPITAL_COMMUNITY): Payer: Self-pay

## 2021-08-31 ENCOUNTER — Ambulatory Visit (INDEPENDENT_AMBULATORY_CARE_PROVIDER_SITE_OTHER): Payer: BC Managed Care – PPO | Admitting: Mental Health

## 2021-08-31 DIAGNOSIS — F331 Major depressive disorder, recurrent, moderate: Secondary | ICD-10-CM | POA: Diagnosis not present

## 2021-08-31 NOTE — Progress Notes (Signed)
[facility] Psych[facility] Note ? ?Name: Christian [name] ?Date: 08/31/21 ?MRN: [id number] ?DOB: [birth year] ?PCP: Cleatis Polka., MD ? ?Time spent: 58 minutes ? ?Treatment: Individual Therapy ? ?Mental Status Exam: ?  ? ?Appearance:   Casual     ?Behavior:  Appropriate  ?Motor:  Normal  ?Speech/Language:   Clear and Coherent    ?Affect:  Full range  ?Mood:  Pleasant, euthymic  ?Thought process:  normal  ?Thought content:    WNL  ?Sensory/Perceptual disturbances:    WNL  ?Orientation:  x4  ?Attention:  Good  ?Concentration:  Good  ?Memory:  intact  ?Fund of knowledge:   WNL  ?Insight:    good  ?Judgment:   Good  ?Impulse Control:  Good  ? ?Reported Symptoms:  Irritable, depressed, anxiety, anger outbursts (verbal), tearful, feelings of inadequacy ? ?Risk Assessment:  ?Danger to Self:  No ?Self-injurious Behavior: No ?Danger to Others: No ?Duty to Warn:no ?Physical Aggression / Violence:No  ?Access to Firearms a concern: No  ?Gang Involvement:No  ?Patient / guardian was educated about steps to take if suicide or homicide risk level increases between visits: yes ?While future psychiatric events cannot be accurately predicted, the patient does not currently require acute inpatient psychiatric care and does not currently meet [facility] involuntary commitment criteria. ? ?Subjective:  ?Patient presents for today's session.  Patient shared recent events, how his wife and son went on a spring break vacation.  Patient stated he was left alone home to work as he had many work demands and he also could care for their new dog that they adopted about 2 weeks ago.  He stated the dog is about 19 months old and has been challenging as the dog is not house broken.  Patient went on to share how and also having some accidents in the home as elicited a high amount of stress from his wife where he states she has a tendency to catastrophize about the situation, life not going right etc.  He identified how he tries to watch  his words carefully as to not elicit more anxiety or distress from her where he states she has a tendency to seclude herself in her room.  This can be challenging as he stated she will often react in situations that he feels do not call for the high degree of emotional response with which she typically can exhibit.  Other family relationships were shared, explained to meet his sister for lunch today, this being a complex relationship in itself as they have had a strained relationship for the past few years. ? ? ? ?Intervention: supportive therapy, motivational interviewing, motivational interviewing ? ? ?Diagnoses:  ?  ICD-10-CM   ?1. Major depressive disorder, recurrent episode, moderate (HCC)  F33.1   ?  ? ? ? ? ? ? ? ?Plan: Patient is to use CBT, mindfulness and coping skills to help manage decrease symptoms associated with their diagnosis.  Patient to follow through with coping skills discussed.  Patient to follow through with engaging in some pleasurable interests for himself, making time to de- stress.   Patient to continue to be mindful of his communication to assist in improving relationships. ? ?Long-term goal:    ?Reduce overall level, frequency, and intensity of the feelings of depression, anxiety and panic evidenced by decreased irritability, negative self talk  from 6 to 7 days/week to 0 to 1 days/week per client report for at least 3 consecutive months.   ? ?Short-term goal:  ?  Verbally express understanding of the relationship between feelings of depression, anxiety and their impact on thinking patterns and behaviors. ?Verbalize an understanding of the role that distorted thinking plays in creating fears, excessive worry, and ruminations. ?Improve effective communication skills to reduce relational stress ?Decrease irritability frequency and anger outbursts ? ?Progress: Progressing ? ?[name] Session, [facility]                     ?

## 2021-09-13 ENCOUNTER — Ambulatory Visit (INDEPENDENT_AMBULATORY_CARE_PROVIDER_SITE_OTHER): Payer: BC Managed Care – PPO | Admitting: Mental Health

## 2021-09-13 DIAGNOSIS — G4733 Obstructive sleep apnea (adult) (pediatric): Secondary | ICD-10-CM | POA: Diagnosis not present

## 2021-09-13 DIAGNOSIS — I10 Essential (primary) hypertension: Secondary | ICD-10-CM | POA: Diagnosis not present

## 2021-09-13 DIAGNOSIS — F331 Major depressive disorder, recurrent, moderate: Secondary | ICD-10-CM

## 2021-09-13 DIAGNOSIS — G43009 Migraine without aura, not intractable, without status migrainosus: Secondary | ICD-10-CM | POA: Diagnosis not present

## 2021-09-13 DIAGNOSIS — Z6841 Body Mass Index (BMI) 40.0 and over, adult: Secondary | ICD-10-CM | POA: Diagnosis not present

## 2021-09-13 NOTE — Progress Notes (Signed)
[facility] Psychotherapy Note ? ?Name: Christian [name] ?Date: 09/13/21 ?MRN: [id number] ?DOB: 04-19-1976 ?PCP: Cleatis Polka., MD ? ?Time spent: 58 minutes ? ?Treatment: Individual Therapy ? ?Mental Status Exam: ?  ? ?Appearance:   Casual     ?Behavior:  Appropriate  ?Motor:  Normal  ?Speech/Language:   Clear and Coherent    ?Affect:  Full range  ?Mood:  Pleasant, euthymic  ?Thought process:  normal  ?Thought content:    WNL  ?Sensory/Perceptual disturbances:    WNL  ?Orientation:  x4  ?Attention:  Good  ?Concentration:  Good  ?Memory:  intact  ?Fund of knowledge:   WNL  ?Insight:    good  ?Judgment:   Good  ?Impulse Control:  Good  ? ?Reported Symptoms:  Irritable, depressed, anxiety, anger outbursts (verbal), tearful, feelings of inadequacy ? ?Risk Assessment:  ?Danger to Self:  No ?Self-injurious Behavior: No ?Danger to Others: No ?Duty to Warn:no ?Physical Aggression / Violence:No  ?Access to Firearms a concern: No  ?Gang Involvement:No  ?Patient / guardian was educated about steps to take if suicide or homicide risk level increases between visits: yes ?While future psychiatric events cannot be accurately predicted, the patient does not currently require acute inpatient psychiatric care and does not currently meet [facility] involuntary commitment criteria. ? ?Subjective:  ?Patient presents for today's session.  Progress toward identified goals with shared. Ways he continues to work on his male relationship through his communication. He expressed various distressful emotions related to his wife's recent behaviors which impart centers around her espousing her unhappiness with various aspects of her life, from having to go to work [employer], to potential grief about not being a grandparent. Ocean stated that he got upset when they were trying to find a parking space one evening they were out, requesting his wife too give him time to calm down. Patient feels his distress tolerance it's been lower  lately any plans to further discuss at his next med management appointment later today. Pausing and  recognizing the changes and thoughts and feelings in these moments was explored collaboratively.  ? ?Intervention: supportive therapy, motivational interviewing, motivational interviewing ? ? ?Diagnoses:  ?  ICD-10-CM   ?1. Major depressive disorder, recurrent episode, moderate (HCC)  F33.1   ?  ? ? ? ? ? ? ? ? ?Plan: Patient is to use CBT, mindfulness and coping skills to help manage decrease symptoms associated with their diagnosis.  Patient to follow through with coping skills discussed.  Patient to follow through with engaging in some pleasurable interests for himself, making time to de- stress.   Patient to continue to be mindful of his communication to assist in improving relationships. ? ?Long-term goal:    ?Reduce overall level, frequency, and intensity of the feelings of depression, anxiety and panic evidenced by decreased irritability, negative self talk  from 6 to 7 days/week to 0 to 1 days/week per client report for at least 3 consecutive months.   ? ?Short-term goal:  ?Verbally express understanding of the relationship between feelings of depression, anxiety and their impact on thinking patterns and behaviors. ?Verbalize an understanding of the role that distorted thinking plays in creating fears, excessive worry, and ruminations. ?Improve effective communication skills to reduce relational stress ?Decrease irritability frequency and anger outbursts ? ?Progress: Progressing ? ?[name] Session, [facility]                     ?

## 2021-09-18 ENCOUNTER — Other Ambulatory Visit: Payer: Self-pay | Admitting: Adult Health

## 2021-09-18 DIAGNOSIS — F411 Generalized anxiety disorder: Secondary | ICD-10-CM

## 2021-09-23 ENCOUNTER — Encounter: Payer: Self-pay | Admitting: Adult Health

## 2021-09-23 ENCOUNTER — Ambulatory Visit (INDEPENDENT_AMBULATORY_CARE_PROVIDER_SITE_OTHER): Payer: BC Managed Care – PPO | Admitting: Adult Health

## 2021-09-23 DIAGNOSIS — F4323 Adjustment disorder with mixed anxiety and depressed mood: Secondary | ICD-10-CM | POA: Diagnosis not present

## 2021-09-23 MED ORDER — FLUOXETINE HCL 10 MG PO CAPS: 10.0000 mg | ORAL_CAPSULE | Freq: Every day | ORAL | 2 refills | Status: DC

## 2021-09-23 NOTE — Progress Notes (Signed)
Christian FarrierMatthew A Milling ?[id number] ?06/22/1975 ?46 y.o. ? ?Subjective:  ? ?Patient ID:  Christian [name] is a 46 y.o. (DOB 08/22/1975) male. ? ?Chief Complaint: No chief complaint on file. ? ? ?HPI ?Christian FarrierMatthew A [name] presents to the office today for follow-up of adjustment disorder. ? ?Describes mood today as "about the same". Pleasant. Denies tearfulness. Mood symptoms - reports increased irritability - "my temper is bad". Son home from college and that increases irritability - son is ADD and he struggles to be around him when not on his medication - "very immature". Daughter also ADD and has a genetic disorder - "gets on our nerves - too". Denies depression and anxiety. Denies panic attacks. Has been taking the Pristiq for a month and is unable to see any improvement. Recalls taking Lexapro and it was helpful. Improved interest and motivation. Taking medications as prescribed.  ?Energy levels stable. Active, does not have a regular exercise routine.  ?Enjoys some usual interests and activities. Married. Lives with wife and 2 children - 1 child away at college. Spending time with family. ?Appetite suppression. Weight stable - 260 pounds. ?Sleeps better some nights than others. Averages 5.5 hours. Reports daytime napping. Had a sleep study recently and was diagnosed with sleep apnea. ?Focus and concentration stable. Completing tasks. Managing aspects of household. Works full-time - 60 hours a wek -[employer].   ?Denies SI or HI.  ?Denies AH or VH. ? ?Previous medication trials: Qysmia, Lexapro ? ? ?Flowsheet Row Admission (Discharged) from 04/23/2021 in [facility]  ?C-SSRS RISK CATEGORY No Risk  ? ?  ?  ? ?Review of Systems:  ?Review of Systems  ?Musculoskeletal:  Negative for gait problem.  ?Neurological:  Negative for tremors.  ?Psychiatric/Behavioral:    ?     Please refer to HPI  ? ?Medications: I have reviewed the patient's current medications. ? ?Current Outpatient Medications  ?Medication Sig Dispense Refill  ? baclofen  (LIORESAL) 10 MG tablet Take 10 mg by mouth 3 (three) times daily as needed. Patient takes once daily at bedtime prn.    ? clorazepate (TRANXENE) 3.75 MG tablet Take 1 tablet (3.75 mg total) by mouth 2 (two) times daily as needed for anxiety. (Patient taking differently: Take 3.75 mg by mouth 2 (two) times daily as needed for anxiety. Rarely takes) 30 tablet 3  ? desvenlafaxine (PRISTIQ) 50 MG 24 hr tablet TAKE 1 TABLET BY MOUTH EVERY DAY 90 tablet 0  ? lisinopril-hydrochlorothiazide (ZESTORETIC) 10-12.5 MG tablet Take 1 tablet by mouth at bedtime.    ? oxybutynin (DITROPAN) 5 MG tablet Take 1 tablet (5 mg total) by mouth every 8 (eight) hours as needed for bladder spasms. 15 tablet 1  ? oxyCODONE (ROXICODONE) 5 MG immediate release tablet Take 1 tablet (5 mg total) by mouth every 8 (eight) hours as needed. 5 tablet 0  ? rizatriptan (MAXALT) 10 MG tablet Take 10 mg by mouth as needed.    ? rosuvastatin (CRESTOR) 10 MG tablet Take 10 mg by mouth at bedtime.    ? Semaglutide-Weight Management 0.5 MG/0.5ML SOAJ Inject 0.5 mg into the skin once a week. 2 mL 0  ? Semaglutide-Weight Management 1 MG/0.5ML SOAJ Inject 1 mg into the skin once a week. 2 mL 0  ? Semaglutide-Weight Management 1.7 MG/0.75ML SOAJ Inject 1.7 mg into the skin once a week. 3 mL 0  ? TROKENDI XR 200 MG CP24 Take 1 capsule by mouth at bedtime.    ? ?No current facility-administered medications for this  visit.  ? ? ?Medication Side Effects: None ? ?Allergies: No Known Allergies ? ?Past Medical History:  ?Diagnosis Date  ? Abdominal mass 2022  ? Pt had an abdominal mass abcess treated with antibiotics. Mass reoccurred. Surgery was done to remove abcess on 04/06/21 by Dr. Karie Soda.  ? Depression   ? Only during Covid. Pt is better and off all medication as of 04/23/2019.  ? Family history of adverse reaction to anesthesia   ? Pt stated that his mother has had severe N & V after anesthesia. He has never had a problem per pt.  ? Hypertension   ?  Meningitis   ? spinal meningitis 1980  ? Metabolic syndrome   ? Migraine headache   ? hx of chronic migraines  ? Obesity   ? Other intervertebral disc degeneration, thoracic region   ? Patient has back pain due to an old injury. He received an epidural injection in his back on 04/22/21.  ? Prediabetes   ? last blood drawn in 01/2021 showed prediabetes per pt  ? Sleep apnea   ? Patient was diagnosed with sleep apnea from a 02/04/2019 home study. He was unable to tolerate a CPAP.  ? Snoring   ? Wears glasses   ? ? ?Past Medical History, Surgical history, Social history, and Family history were reviewed and updated as appropriate.  ? ?Please see review of systems for further details on the patient's review from today.  ? ?Objective:  ? ?Physical Exam:  ?There were no vitals taken for this visit. ? ?Physical Exam ?Constitutional:   ?   General: He is not in acute distress. ?Musculoskeletal:     ?   General: No deformity.  ?Neurological:  ?   Mental Status: He is alert and oriented to person, place, and time.  ?   Coordination: Coordination normal.  ?Psychiatric:     ?   Attention and Perception: Attention and perception normal. He does not perceive auditory or visual hallucinations.     ?   Mood and Affect: Mood normal. Mood is not anxious or depressed. Affect is not labile, blunt, angry or inappropriate.     ?   Speech: Speech normal.     ?   Behavior: Behavior normal.     ?   Thought Content: Thought content normal. Thought content is not paranoid or delusional. Thought content does not include homicidal or suicidal ideation. Thought content does not include homicidal or suicidal plan.     ?   Cognition and Memory: Cognition and memory normal.     ?   Judgment: Judgment normal.  ?   Comments: Insight intact  ? ? ?Lab Review:  ?   ?Component Value Date/Time  ? NA 139 04/23/2021 1128  ? K 3.1 (L) 04/23/2021 1128  ? CL 106 04/23/2021 1128  ? CO2 25 04/19/2014 1152  ? GLUCOSE 118 (H) 04/23/2021 1128  ? BUN 23 (H) 04/23/2021  1128  ? CREATININE 0.80 04/23/2021 1128  ? CALCIUM 9.4 04/19/2014 1152  ? PROT 8.0 04/19/2014 1152  ? ALBUMIN 4.1 04/19/2014 1152  ? AST 18 04/19/2014 1152  ? ALT 23 04/19/2014 1152  ? ALKPHOS 92 04/19/2014 1152  ? BILITOT 0.3 04/19/2014 1152  ? GFRNONAA >90 04/19/2014 1152  ? GFRAA >90 04/19/2014 1152  ? ? ?   ?Component Value Date/Time  ? WBC 7.0 04/19/2014 1152  ? RBC 5.38 04/19/2014 1152  ? HGB 16.3 04/23/2021 1128  ? HGB 14.8 11/25/2008  1520  ? HCT 48.0 04/23/2021 1128  ? HCT 43.3 11/25/2008 1520  ? PLT 152 04/19/2014 1152  ? PLT 196 11/25/2008 1520  ? MCV 87.2 04/19/2014 1152  ? MCV 87.8 11/25/2008 1520  ? MCH 29.4 04/19/2014 1152  ? MCHC 33.7 04/19/2014 1152  ? RDW 13.0 04/19/2014 1152  ? RDW 13.8 11/25/2008 1520  ? LYMPHSABS 1.4 04/19/2014 1152  ? LYMPHSABS 2.0 11/25/2008 1520  ? MONOABS 1.0 04/19/2014 1152  ? MONOABS 0.7 11/25/2008 1520  ? EOSABS 0.1 04/19/2014 1152  ? EOSABS 0.1 11/25/2008 1520  ? BASOSABS 0.0 04/19/2014 1152  ? BASOSABS 0.1 11/25/2008 1520  ? ? ?No results found for: POCLITH, LITHIUM  ? ?No results found for: PHENYTOIN, PHENOBARB, VALPROATE, CBMZ  ? ?.res ?Assessment: Plan:   ? ?Plan: ? ?PDMP reviewed ?  ?D/C Pristiq 50mg  daily ?Add Prozac 10mg  daily - will call if wanting to increase to the 20mg  dose. Out of town for 2 months. ? ?RTC 4 weeks ? ?Patient advised to contact office with any questions, adverse effects, or acute worsening in signs and symptoms. ? ?Time spent with patient was 15 minutes. Greater than 50% of face to face time with patient was spent on counseling and coordination of care.   ? ?Discussed potential benefits, risk, and side effects of benzodiazepines to include potential risk of tolerance and dependence, as well as possible drowsiness.  Advised patient not to drive if experiencing drowsiness and to take lowest possible effective dose to minimize risk of dependence and tolerance. ? ?There are no diagnoses linked to this encounter.  ? ?Please see After Visit Summary  for patient specific instructions. ? ?Future Appointments  ?Date Time Provider Department Center  ?09/23/2021  8:40 AM Kalyani Maeda, , NP [facility] None  ?09/28/2021  9:00 AM 11/23/2021, [facility]

## 2021-09-28 ENCOUNTER — Ambulatory Visit (INDEPENDENT_AMBULATORY_CARE_PROVIDER_SITE_OTHER): Payer: BC Managed Care – PPO | Admitting: Mental Health

## 2021-09-28 DIAGNOSIS — F411 Generalized anxiety disorder: Secondary | ICD-10-CM

## 2021-09-28 DIAGNOSIS — F4323 Adjustment disorder with mixed anxiety and depressed mood: Secondary | ICD-10-CM | POA: Diagnosis not present

## 2021-09-28 NOTE — Progress Notes (Addendum)
[facility] Psychotherapy Note ? ?Name: Christian [name] ?Date: 09/28/21 ?MRN: [id number] ?DOB: 1976-03-05 ?PCP: Cleatis Polka., MD ? ?Time spent: 60 minutes ? ?Treatment: Individual Therapy ? ?Mental Status Exam: ?  ? ?Appearance:   Casual     ?Behavior:  Appropriate  ?Motor:  Normal  ?Speech/Language:   Clear and Coherent    ?Affect:  Full range  ?Mood:   Agitated, sad  ?Thought process:  normal  ?Thought content:    WNL  ?Sensory/Perceptual disturbances:    WNL  ?Orientation:  x4  ?Attention:  Good  ?Concentration:  Good  ?Memory:  intact  ?Fund of knowledge:   WNL  ?Insight:    good  ?Judgment:   Good  ?Impulse Control:  Good  ? ?Reported Symptoms:  Irritable, depressed, anxiety, anger outbursts (verbal), tearful, feelings of inadequacy ? ?Risk Assessment:  ?Danger to Self:  No ?Self-injurious Behavior: No ?Danger to Others: No ?Duty to Warn:no ?Physical Aggression / Violence:No  ?Access to Firearms a concern: No  ?Gang Involvement:No  ?Patient / guardian was educated about steps to take if suicide or homicide risk level increases between visits: yes ?While future psychiatric events cannot be accurately predicted, the patient does not currently require acute inpatient psychiatric care and does not currently meet [facility] involuntary commitment criteria. ? ?Subjective:  ?Patient presents for today's session.  Reports some recent stress related to family relationships.  Patient went on to share relationship difficulties over the last weekend.  He stated that his oldest son is home from college and how he has a tendency to misplace items, leave doors unlocked etc.  He stated that he confronted his son on leaving the door unlocked to the house as everyone was asleep.  He stated it evolved into an argument to the point where patient stated that he was extremely upset making critical comments.  He feels that it is in part stemming from his marital stress has been ongoing to 1 degree or another over the  past year or 2.  At this point, he stated that he and his wife have agreed to separate, he stated they will have time apart for the next 2 months over the summer. ?Although patient acknowledges the relational stress that has been ongoing in his marital relationship and most recently with his older son who has returned home from college, he had a recent medication management appointment plans to adhere to his regimen as he reports it has been helpful in the past.  Assisted him in identifying framing needs where he plans to spend less time at home as needed boundary, going to work on site as opposed to working at home. ? ? ?Intervention: supportive therapy, motivational interviewing, motivational interviewing ? ? ?Diagnoses:  ?  ICD-10-CM   ?1. Adjustment disorder with mixed anxiety and depressed mood  F43.23   ?  ?2. Generalized anxiety disorder  F41.1   ?  ? ? ? ? ?Plan: Patient is to use CBT, mindfulness and coping skills,  Patient to follow through with coping skills discussed. Patient to continue to be mindful of his communication and maintaining boundaries to keep stress more manageable.   ? ?Long-term goal:    ?Reduce overall level, frequency, and intensity of the feelings of depression, anxiety and panic evidenced by decreased irritability, negative self talk  from 6 to 7 days/week to 0 to 1 days/week per client report for at least 3 consecutive months.   ? ?Short-term goal:  ?Verbally express understanding  of the relationship between feelings of depression, anxiety and their impact on thinking patterns and behaviors. ?Verbalize an understanding of the role that distorted thinking plays in creating fears, excessive worry, and ruminations. ?Improve effective communication skills to reduce relational stress ?Decrease irritability frequency and anger outbursts ? ?Progress: Progressing ? ?[name] Session, [facility]                     ?

## 2021-10-04 ENCOUNTER — Other Ambulatory Visit (HOSPITAL_COMMUNITY): Payer: Self-pay

## 2021-10-04 DIAGNOSIS — R7301 Impaired fasting glucose: Secondary | ICD-10-CM | POA: Diagnosis not present

## 2021-10-04 DIAGNOSIS — I10 Essential (primary) hypertension: Secondary | ICD-10-CM | POA: Diagnosis not present

## 2021-10-04 MED ORDER — SEMAGLUTIDE-WEIGHT MANAGEMENT 1 MG/0.5ML ~~LOC~~ SOAJ
1.0000 mg | SUBCUTANEOUS | 11 refills | Status: DC
  Filled 2021-10-04 – 2022-06-13 (×8): qty 2, 28d supply, fill #0
  Filled 2022-07-07: qty 2, 28d supply, fill #1
  Filled 2022-08-16: qty 2, 28d supply, fill #2

## 2021-10-07 ENCOUNTER — Other Ambulatory Visit (HOSPITAL_COMMUNITY): Payer: Self-pay

## 2021-10-08 ENCOUNTER — Other Ambulatory Visit (HOSPITAL_COMMUNITY): Payer: Self-pay

## 2021-10-12 DIAGNOSIS — G4733 Obstructive sleep apnea (adult) (pediatric): Secondary | ICD-10-CM | POA: Diagnosis not present

## 2021-10-14 ENCOUNTER — Other Ambulatory Visit (HOSPITAL_COMMUNITY): Payer: Self-pay

## 2021-10-16 ENCOUNTER — Other Ambulatory Visit: Payer: Self-pay | Admitting: Adult Health

## 2021-10-16 DIAGNOSIS — F4323 Adjustment disorder with mixed anxiety and depressed mood: Secondary | ICD-10-CM

## 2021-10-18 ENCOUNTER — Other Ambulatory Visit: Payer: Self-pay

## 2021-10-18 DIAGNOSIS — F4323 Adjustment disorder with mixed anxiety and depressed mood: Secondary | ICD-10-CM

## 2021-10-18 MED ORDER — FLUOXETINE HCL 10 MG PO CAPS: ORAL_CAPSULE | ORAL | 0 refills | Status: DC

## 2021-11-12 DIAGNOSIS — G4733 Obstructive sleep apnea (adult) (pediatric): Secondary | ICD-10-CM | POA: Diagnosis not present

## 2021-11-26 DIAGNOSIS — G4733 Obstructive sleep apnea (adult) (pediatric): Secondary | ICD-10-CM | POA: Diagnosis not present

## 2021-11-30 DIAGNOSIS — Z6841 Body Mass Index (BMI) 40.0 and over, adult: Secondary | ICD-10-CM | POA: Diagnosis not present

## 2021-11-30 DIAGNOSIS — G43709 Chronic migraine without aura, not intractable, without status migrainosus: Secondary | ICD-10-CM | POA: Diagnosis not present

## 2021-11-30 DIAGNOSIS — F419 Anxiety disorder, unspecified: Secondary | ICD-10-CM | POA: Diagnosis not present

## 2021-12-11 ENCOUNTER — Other Ambulatory Visit: Payer: Self-pay | Admitting: Adult Health

## 2021-12-11 DIAGNOSIS — F411 Generalized anxiety disorder: Secondary | ICD-10-CM

## 2021-12-12 DIAGNOSIS — G4733 Obstructive sleep apnea (adult) (pediatric): Secondary | ICD-10-CM | POA: Diagnosis not present

## 2021-12-14 ENCOUNTER — Other Ambulatory Visit (HOSPITAL_COMMUNITY): Payer: Self-pay

## 2021-12-14 MED ORDER — TROKENDI XR 200 MG PO CP24
200.0000 mg | ORAL_CAPSULE | Freq: Every day | ORAL | 5 refills | Status: DC
  Filled 2021-12-14 – 2022-03-18 (×2): qty 30, 30d supply, fill #0

## 2021-12-14 MED ORDER — TROKENDI XR 200 MG PO CP24
200.0000 mg | ORAL_CAPSULE | Freq: Every day | ORAL | 5 refills | Status: DC
  Filled 2021-12-14: qty 30, 30d supply, fill #0
  Filled 2022-01-09: qty 30, 30d supply, fill #1
  Filled 2022-02-09: qty 30, 30d supply, fill #2

## 2021-12-15 ENCOUNTER — Other Ambulatory Visit: Payer: Self-pay | Admitting: Adult Health

## 2021-12-15 DIAGNOSIS — F4323 Adjustment disorder with mixed anxiety and depressed mood: Secondary | ICD-10-CM

## 2021-12-20 ENCOUNTER — Encounter: Payer: Self-pay | Admitting: Adult Health

## 2021-12-20 ENCOUNTER — Ambulatory Visit (INDEPENDENT_AMBULATORY_CARE_PROVIDER_SITE_OTHER): Payer: BC Managed Care – PPO | Admitting: Adult Health

## 2021-12-20 DIAGNOSIS — F4323 Adjustment disorder with mixed anxiety and depressed mood: Secondary | ICD-10-CM

## 2021-12-20 MED ORDER — FLUOXETINE HCL 40 MG PO CAPS: 40.0000 mg | ORAL_CAPSULE | Freq: Every day | ORAL | 5 refills | Status: DC

## 2021-12-20 NOTE — Progress Notes (Signed)
Christian [name] [id number] July 05, 1975 46 y.o.  Subjective:   Patient ID:  Christian [name] is a 46 y.o. (DOB 03-11-1976) male.  Chief Complaint: No chief complaint on file.   HPI Christian [name] presents to the office today for follow-up of adjustment disorder.  Describes mood today as "a little better". Pleasant. Denies tearfulness. Mood symptoms - reports decreased irritability - "not getting as agitated". Denies depression - "not so much". Increased anxiety over the summer. Denies panic attacks. Mood is consistent. Has increased Prozac to 20mg  daily - "helpful, but not enough". Improved interest and motivation. Taking medications as prescribed.  Energy levels stable. Active, does not have a regular exercise routine. Walking dogs. Enjoys some usual interests and activities. Married. Lives with wife and 3 children - 39 year old so - daughter at home 28 12 - son away at college - [employer]. Spending time with family. Appetite suppression. Weight gain - 260 to 274 pounds. Sleeps has improved. Averages 5 to 7+ hours. Denies daytime napping.  Focus and concentration stable. Completing tasks. Managing aspects of household. Works full-time - 50+ hours a wek -[employer].   Denies SI or HI.  Denies AH or VH.  Previous medication trials: Clinical cytogeneticist   Flowsheet Row Admission (Discharged) from 04/23/2021 in [facility]  C-SSRS RISK CATEGORY No Risk        Review of Systems:  Review of Systems  Musculoskeletal:  Negative for gait problem.  Neurological:  Negative for tremors.  Psychiatric/Behavioral:         Please refer to HPI    Medications: I have reviewed the patient's current medications.  Current Outpatient Medications  Medication Sig Dispense Refill   baclofen (LIORESAL) 10 MG tablet Take 10 mg by mouth 3 (three) times daily as needed. Patient takes once daily at bedtime prn.     clorazepate (TRANXENE) 3.75 MG tablet Take 1 tablet (3.75 mg total) by mouth 2 (two) times  daily as needed for anxiety. (Patient taking differently: Take 3.75 mg by mouth 2 (two) times daily as needed for anxiety. Rarely takes) 30 tablet 3   FLUoxetine (PROZAC) 40 MG capsule Take 1 capsule (40 mg total) by mouth daily. 30 capsule 5   lisinopril-hydrochlorothiazide (ZESTORETIC) 10-12.5 MG tablet Take 1 tablet by mouth at bedtime.     oxybutynin (DITROPAN) 5 MG tablet Take 1 tablet (5 mg total) by mouth every 8 (eight) hours as needed for bladder spasms. 15 tablet 1   oxyCODONE (ROXICODONE) 5 MG immediate release tablet Take 1 tablet (5 mg total) by mouth every 8 (eight) hours as needed. 5 tablet 0   rizatriptan (MAXALT) 10 MG tablet Take 10 mg by mouth as needed.     rosuvastatin (CRESTOR) 10 MG tablet Take 10 mg by mouth at bedtime.     Semaglutide-Weight Management 0.5 MG/0.5ML SOAJ Inject 0.5 mg into the skin once a week. 2 mL 0   Semaglutide-Weight Management 1 MG/0.5ML SOAJ Inject 1 mg into the skin once a week. 2 mL 11   Semaglutide-Weight Management 1.7 MG/0.75ML SOAJ Inject 1.7 mg into the skin once a week. 3 mL 0   TROKENDI XR 200 MG CP24 Take 1 capsule by mouth at bedtime.     TROKENDI XR 200 MG CP24 Take 1 capsule (200 mg) by mouth daily. 30 capsule 5   TROKENDI XR 200 MG CP24 Take 1 capsule (200 mg) by mouth daily. 30 capsule 5   No current facility-administered medications for this visit.  Medication Side Effects: None  Allergies: No Known Allergies  Past Medical History:  Diagnosis Date   Abdominal mass 2022   Pt had an abdominal mass abcess treated with antibiotics. Mass reoccurred. Surgery was done to remove abcess on 04/06/21 by Dr. Karie Soda.   Depression    Only during Covid. Pt is better and off all medication as of 04/23/2019.   Family history of adverse reaction to anesthesia    Pt stated that his mother has had severe N & V after anesthesia. He has never had a problem per pt.   Hypertension    Meningitis    spinal meningitis 1980   Metabolic  syndrome    Migraine headache    hx of chronic migraines   Obesity    Other intervertebral disc degeneration, thoracic region    Patient has back pain due to an old injury. He received an epidural injection in his back on 04/22/21.   Prediabetes    last blood drawn in 01/2021 showed prediabetes per pt   Sleep apnea    Patient was diagnosed with sleep apnea from a 02/04/2019 home study. He was unable to tolerate a CPAP.   Snoring    Wears glasses     Past Medical History, Surgical history, Social history, and Family history were reviewed and updated as appropriate.   Please see review of systems for further details on the patient's review from today.   Objective:   Physical Exam:  There were no vitals taken for this visit.  Physical Exam Constitutional:      General: He is not in acute distress. Musculoskeletal:        General: No deformity.  Neurological:     Mental Status: He is alert and oriented to person, place, and time.     Coordination: Coordination normal.  Psychiatric:        Attention and Perception: Attention and perception normal. He does not perceive auditory or visual hallucinations.        Mood and Affect: Mood normal. Mood is not anxious or depressed. Affect is not labile, blunt, angry or inappropriate.        Speech: Speech normal.        Behavior: Behavior normal.        Thought Content: Thought content normal. Thought content is not paranoid or delusional. Thought content does not include homicidal or suicidal ideation. Thought content does not include homicidal or suicidal plan.        Cognition and Memory: Cognition and memory normal.        Judgment: Judgment normal.     Comments: Insight intact     Lab Review:     Component Value Date/Time   NA 139 04/23/2021 1128   K 3.1 (L) 04/23/2021 1128   CL 106 04/23/2021 1128   CO2 25 04/19/2014 1152   GLUCOSE 118 (H) 04/23/2021 1128   BUN 23 (H) 04/23/2021 1128   CREATININE 0.80 04/23/2021 1128    CALCIUM 9.4 04/19/2014 1152   PROT 8.0 04/19/2014 1152   ALBUMIN 4.1 04/19/2014 1152   AST 18 04/19/2014 1152   ALT 23 04/19/2014 1152   ALKPHOS 92 04/19/2014 1152   BILITOT 0.3 04/19/2014 1152   GFRNONAA >90 04/19/2014 1152   GFRAA >90 04/19/2014 1152       Component Value Date/Time   WBC 7.0 04/19/2014 1152   RBC 5.38 04/19/2014 1152   HGB 16.3 04/23/2021 1128   HGB 14.8 11/25/2008 1520  HCT 48.0 04/23/2021 1128   HCT 43.3 11/25/2008 1520   PLT 152 04/19/2014 1152   PLT 196 11/25/2008 1520   MCV 87.2 04/19/2014 1152   MCV 87.8 11/25/2008 1520   MCH 29.4 04/19/2014 1152   MCHC 33.7 04/19/2014 1152   RDW 13.0 04/19/2014 1152   RDW 13.8 11/25/2008 1520   LYMPHSABS 1.4 04/19/2014 1152   LYMPHSABS 2.0 11/25/2008 1520   MONOABS 1.0 04/19/2014 1152   MONOABS 0.7 11/25/2008 1520   EOSABS 0.1 04/19/2014 1152   EOSABS 0.1 11/25/2008 1520   BASOSABS 0.0 04/19/2014 1152   BASOSABS 0.1 11/25/2008 1520    No results found for: "POCLITH", "LITHIUM"   No results found for: "PHENYTOIN", "PHENOBARB", "VALPROATE", "CBMZ"   .res Assessment: Plan:    Plan:  PDMP reviewed  Increase Prozac 20mg  to 40mg  daily   RTC 2 months   Patient advised to contact office with any questions, adverse effects, or acute worsening in signs and symptoms.  Time spent with patient was 20 minutes. Greater than 50% of face to face time with patient was spent on counseling and coordination of care.    Discussed potential benefits, risk, and side effects of benzodiazepines to include potential risk of tolerance and dependence, as well as possible drowsiness.  Advised patient not to drive if experiencing drowsiness and to take lowest possible effective dose to minimize risk of dependence and tolerance.  Diagnoses and all orders for this visit:  Adjustment disorder with mixed anxiety and depressed mood -     FLUoxetine (PROZAC) 40 MG capsule; Take 1 capsule (40 mg total) by mouth daily.     Please  see After Visit Summary for patient specific instructions.  No future appointments.  No orders of the defined types were placed in this encounter.   -------------------------------

## 2021-12-21 DIAGNOSIS — G4733 Obstructive sleep apnea (adult) (pediatric): Secondary | ICD-10-CM | POA: Diagnosis not present

## 2021-12-21 DIAGNOSIS — I10 Essential (primary) hypertension: Secondary | ICD-10-CM | POA: Diagnosis not present

## 2021-12-21 DIAGNOSIS — Z9989 Dependence on other enabling machines and devices: Secondary | ICD-10-CM | POA: Diagnosis not present

## 2021-12-23 DIAGNOSIS — M5134 Other intervertebral disc degeneration, thoracic region: Secondary | ICD-10-CM | POA: Diagnosis not present

## 2021-12-23 DIAGNOSIS — M47814 Spondylosis without myelopathy or radiculopathy, thoracic region: Secondary | ICD-10-CM | POA: Diagnosis not present

## 2021-12-24 DIAGNOSIS — G4733 Obstructive sleep apnea (adult) (pediatric): Secondary | ICD-10-CM | POA: Diagnosis not present

## 2021-12-27 DIAGNOSIS — G4733 Obstructive sleep apnea (adult) (pediatric): Secondary | ICD-10-CM | POA: Diagnosis not present

## 2022-01-10 ENCOUNTER — Other Ambulatory Visit (HOSPITAL_COMMUNITY): Payer: Self-pay

## 2022-01-11 ENCOUNTER — Other Ambulatory Visit: Payer: Self-pay | Admitting: Adult Health

## 2022-01-11 DIAGNOSIS — F4323 Adjustment disorder with mixed anxiety and depressed mood: Secondary | ICD-10-CM

## 2022-01-12 DIAGNOSIS — G4733 Obstructive sleep apnea (adult) (pediatric): Secondary | ICD-10-CM | POA: Diagnosis not present

## 2022-01-24 ENCOUNTER — Ambulatory Visit (INDEPENDENT_AMBULATORY_CARE_PROVIDER_SITE_OTHER): Payer: BC Managed Care – PPO | Admitting: Mental Health

## 2022-01-24 DIAGNOSIS — F4323 Adjustment disorder with mixed anxiety and depressed mood: Secondary | ICD-10-CM | POA: Diagnosis not present

## 2022-01-24 NOTE — Progress Notes (Signed)
[facility] Psychotherapy Note  Name: Christian [name] Date: 01/24/22 MRN: [id number] DOB: [birth year] PCP: Cleatis Polka., MD  Time spent: 55 minutes  Treatment: Individual Therapy  Mental Status Exam:    Appearance:   Casual     Behavior:  Appropriate  Motor:  Normal  Speech/Language:   Clear and Coherent    Affect:  Full range  Mood:   euthymic  Thought process:  normal  Thought content:    WNL  Sensory/Perceptual disturbances:    WNL  Orientation:  x4  Attention:  Good  Concentration:  Good  Memory:  intact  Fund of knowledge:   WNL  Insight:    good  Judgment:   Good  Impulse Control:  Good   Reported Symptoms:  Irritable, depressed, anxiety, anger outbursts (verbal), tearful, feelings of inadequacy  Risk Assessment:  Danger to Self:  No Self-injurious Behavior: No Danger to Others: No Duty to Warn:no Physical Aggression / Violence:No  Access to Firearms a concern: No  Gang Involvement:No  Patient / guardian was educated about steps to take if suicide or homicide risk level increases between visits: yes While future psychiatric events cannot be accurately predicted, the patient does not currently require acute inpatient psychiatric care and does not currently meet [facility] involuntary commitment criteria.  Subjective:  Patient presents for today's session.  Patient shared recent events over the past 2 months as he and his family over the long extended vacation.  Patient stated he was able to work during that time as well as he often works from home.  Experiences were shared as well as changes with relationships.  He stated that he and his wife continue to have frequent relationship stress.  Facilitated patient further shared some details.  He often hears from her and that he is not doing enough either around the house with the family, however, patient stated his keeping up with making sure the kids get to the practices for sports, scouts, his efforts  in cooking most often for the family, cleaning not being validated by his wife.  He shared how he continues to want to work on his stress management, and is adhering to his current psychiatric medications which he states has been helpful, less irritable and agitated.  He also would like to continue on his journey toward weight loss.  Facilitated his framing his needs, ways he attempts to cope with the relational stress.   Intervention: supportive therapy, motivational interviewing, motivational interviewing   Diagnoses:    ICD-10-CM   1. Adjustment disorder with mixed anxiety and depressed mood  F43.23          Plan: Patient is to use CBT, mindfulness and coping skills,  Patient to follow through with coping skills discussed. Patient to continue to be mindful of his communication and maintaining boundaries to keep stress more manageable.    Long-term goal:    Reduce overall level, frequency, and intensity of the feelings of depression, anxiety and panic evidenced by decreased irritability, negative self talk  from 6 to 7 days/week to 0 to 1 days/week per client report for at least 3 consecutive months.    Short-term goal:  Verbally express understanding of the relationship between feelings of depression, anxiety and their impact on thinking patterns and behaviors. Verbalize an understanding of the role that distorted thinking plays in creating fears, excessive worry, and ruminations. Improve effective communication skills to reduce relational stress Decrease irritability frequency and anger outbursts  Progress: Progressing  An[place], [facility]

## 2022-01-27 DIAGNOSIS — G4733 Obstructive sleep apnea (adult) (pediatric): Secondary | ICD-10-CM | POA: Diagnosis not present

## 2022-02-03 ENCOUNTER — Ambulatory Visit: Payer: BC Managed Care – PPO | Admitting: Mental Health

## 2022-02-09 ENCOUNTER — Other Ambulatory Visit (HOSPITAL_COMMUNITY): Payer: Self-pay

## 2022-02-11 ENCOUNTER — Other Ambulatory Visit (HOSPITAL_COMMUNITY): Payer: Self-pay

## 2022-02-18 ENCOUNTER — Ambulatory Visit (INDEPENDENT_AMBULATORY_CARE_PROVIDER_SITE_OTHER): Payer: BC Managed Care – PPO | Admitting: Mental Health

## 2022-02-18 DIAGNOSIS — F4323 Adjustment disorder with mixed anxiety and depressed mood: Secondary | ICD-10-CM | POA: Diagnosis not present

## 2022-02-18 NOTE — Progress Notes (Signed)
[facility] Psychotherapy Note  Name: Christian [name] Date: 02/18/22 MRN: [id number] DOB: 19-Nov-1975 PCP: Christian [name]., MD  Time spent: 54 minutes  Treatment: Individual Therapy  Mental Status Exam:    Appearance:   Casual     Behavior:  Appropriate  Motor:  Normal  Speech/Language:   Clear and Coherent    Affect:  Full range  Mood:   euthymic  Thought process:  normal  Thought content:    WNL  Sensory/Perceptual disturbances:    WNL  Orientation:  x4  Attention:  Good  Concentration:  Good  Memory:  intact  Fund of knowledge:   WNL  Insight:    good  Judgment:   Good  Impulse Control:  Good   Reported Symptoms:  Irritable, depressed, anxiety, anger outbursts (verbal), tearful, feelings of inadequacy  Risk Assessment:  Danger to Self:  No Self-injurious Behavior: No Danger to Others: No Duty to Warn:no Physical Aggression / Violence:No  Access to Firearms a concern: No  Gang Involvement:No  Patient / guardian was educated about steps to take if suicide or homicide risk level increases between visits: yes While future psychiatric events cannot be accurately predicted, the patient does not currently require acute inpatient psychiatric care and does not currently meet [facility] involuntary commitment criteria.  Subjective:  Patient presents for today's session.  Assess progress, events since last visit.  Patient shared with some detail the struggle his daughter indoors at times by attending her current school.  He went on to share experiences with her volleyball team with which she has decided to quit due to how she was treated by her coach as well as some of her teammates.  Additionally, she continues to cope with being bullied at school by some peers and patient and his wife have communicated with administration about their concerns.  Facilitated patient further processing feelings related, how to cope effectively by communicating the concerns with  administration as needed.  He reports some decreased marital stress recently, he feels in part due to their being more focused on recent issues involving their children.  He stated that his older son is away at college has had to adjust to his living situation.  Through guided discovery, he identified the need to be supportive of his son however, not check his grades frequently as to give him more responsibility and to work on focusing what he can control.   Intervention: supportive therapy, motivational interviewing, motivational interviewing   Diagnoses:    ICD-10-CM   1. Adjustment disorder with mixed anxiety and depressed mood  F43.23           Plan: Patient is to use CBT, mindfulness and coping skills,  Patient to follow through with coping skills discussed. Patient to continue to be mindful of his communication and maintaining boundaries to keep stress more manageable.    Long-term goal:    Reduce overall level, frequency, and intensity of the feelings of depression, anxiety and panic evidenced by decreased irritability, negative self talk  from 6 to 7 days/week to 0 to 1 days/week per client report for at least 3 consecutive months.    Short-term goal:  Verbally express understanding of the relationship between feelings of depression, anxiety and their impact on thinking patterns and behaviors. Verbalize an understanding of the role that distorted thinking plays in creating fears, excessive worry, and ruminations. Improve effective communication skills to reduce relational stress Decrease irritability frequency and anger outbursts  Progress: Progressing  [place], [facility]

## 2022-02-21 ENCOUNTER — Encounter: Payer: Self-pay | Admitting: Adult Health

## 2022-02-21 ENCOUNTER — Ambulatory Visit (INDEPENDENT_AMBULATORY_CARE_PROVIDER_SITE_OTHER): Payer: BC Managed Care – PPO | Admitting: Adult Health

## 2022-02-21 DIAGNOSIS — F4323 Adjustment disorder with mixed anxiety and depressed mood: Secondary | ICD-10-CM

## 2022-02-21 MED ORDER — FLUOXETINE HCL 40 MG PO CAPS: 40.0000 mg | ORAL_CAPSULE | Freq: Every day | ORAL | 1 refills | Status: DC

## 2022-02-21 NOTE — Progress Notes (Signed)
Christian [name] [id number] [birth year] 46 y.o.  Subjective:   Patient ID:  Christian [name] is a 46 y.o. (DOB [birth year]) male.  Chief Complaint: No chief complaint on file.   HPI Christian [name] presents to the office today for follow-up of adjustment disorder.  Describes mood today as "ok". Pleasant. Denies tearfulness. Mood symptoms - denies depression, anxiety or irritability. Denies panic attacks. Mood is consistent - "not getting mad". Feels like the increase in Prozac to 40mg  has been helpful.  Improved interest and motivation. Taking medications as prescribed.  Energy levels stable. Active, does not have a regular exercise routine. Walking dogs. Enjoys some usual interests and activities. Married. Lives with wife and 3 children. Spending time with family. Appetite suppression. Weight gain - 274 pounds. Sleeps has improved. Averages 6 to 7 hours. Denies daytime napping.  Focus and concentration stable. Completing tasks. Managing aspects of household. Works full-time - 50+ hours a wek - .   Denies SI or HI.  Denies AH or VH.  Previous medication trials: Clinical cytogeneticist   Flowsheet Row Admission (Discharged) from 04/23/2021 in [facility]  C-SSRS RISK CATEGORY No Risk        Review of Systems:  Review of Systems  Musculoskeletal:  Negative for gait problem.  Neurological:  Negative for tremors.  Psychiatric/Behavioral:         Please refer to HPI    Medications: I have reviewed the patient's current medications.  Current Outpatient Medications  Medication Sig Dispense Refill   baclofen (LIORESAL) 10 MG tablet Take 10 mg by mouth 3 (three) times daily as needed. Patient takes once daily at bedtime prn.     clorazepate (TRANXENE) 3.75 MG tablet Take 1 tablet (3.75 mg total) by mouth 2 (two) times daily as needed for anxiety. (Patient taking differently: Take 3.75 mg by mouth 2 (two) times daily as needed for anxiety. Rarely takes) 30 tablet 3   FLUoxetine  (PROZAC) 40 MG capsule TAKE 1 CAPSULE (40 MG TOTAL) BY MOUTH DAILY. 90 capsule 0   lisinopril-hydrochlorothiazide (ZESTORETIC) 10-12.5 MG tablet Take 1 tablet by mouth at bedtime.     oxybutynin (DITROPAN) 5 MG tablet Take 1 tablet (5 mg total) by mouth every 8 (eight) hours as needed for bladder spasms. 15 tablet 1   oxyCODONE (ROXICODONE) 5 MG immediate release tablet Take 1 tablet (5 mg total) by mouth every 8 (eight) hours as needed. 5 tablet 0   rizatriptan (MAXALT) 10 MG tablet Take 10 mg by mouth as needed.     rosuvastatin (CRESTOR) 10 MG tablet Take 10 mg by mouth at bedtime.     Semaglutide-Weight Management 0.5 MG/0.5ML SOAJ Inject 0.5 mg into the skin once a week. 2 mL 0   Semaglutide-Weight Management 1 MG/0.5ML SOAJ Inject 1 mg into the skin once a week. 2 mL 11   Semaglutide-Weight Management 1.7 MG/0.75ML SOAJ Inject 1.7 mg into the skin once a week. 3 mL 0   TROKENDI XR 200 MG CP24 Take 1 capsule by mouth at bedtime.     TROKENDI XR 200 MG CP24 Take 1 capsule (200 mg) by mouth daily. 30 capsule 5   TROKENDI XR 200 MG CP24 Take 1 capsule (200 mg) by mouth daily. 30 capsule 5   No current facility-administered medications for this visit.    Medication Side Effects: None  Allergies: No Known Allergies  Past Medical History:  Diagnosis Date   Abdominal mass 2022   Pt had an abdominal mass abcess  treated with antibiotics. Mass reoccurred. Surgery was done to remove abcess on 04/06/21 by Dr. Karie Soda.   Depression    Only during Covid. Pt is better and off all medication as of 04/23/2019.   Family history of adverse reaction to anesthesia    Pt stated that his mother has had severe N & V after anesthesia. He has never had a problem per pt.   Hypertension    Meningitis    spinal meningitis 1980   Metabolic syndrome    Migraine headache    hx of chronic migraines   Obesity    Other intervertebral disc degeneration, thoracic region    Patient has back pain due to an old  injury. He received an epidural injection in his back on 04/22/21.   Prediabetes    last blood drawn in 01/2021 showed prediabetes per pt   Sleep apnea    Patient was diagnosed with sleep apnea from a 02/04/2019 home study. He was unable to tolerate a CPAP.   Snoring    Wears glasses     Past Medical History, Surgical history, Social history, and Family history were reviewed and updated as appropriate.   Please see review of systems for further details on the patient's review from today.   Objective:   Physical Exam:  There were no vitals taken for this visit.  Physical Exam Constitutional:      General: He is not in acute distress. Musculoskeletal:        General: No deformity.  Neurological:     Mental Status: He is alert and oriented to person, place, and time.     Coordination: Coordination normal.  Psychiatric:        Attention and Perception: Attention and perception normal. He does not perceive auditory or visual hallucinations.        Mood and Affect: Mood normal. Mood is not anxious or depressed. Affect is not labile, blunt, angry or inappropriate.        Speech: Speech normal.        Behavior: Behavior normal.        Thought Content: Thought content normal. Thought content is not paranoid or delusional. Thought content does not include homicidal or suicidal ideation. Thought content does not include homicidal or suicidal plan.        Cognition and Memory: Cognition and memory normal.        Judgment: Judgment normal.     Comments: Insight intact     Lab Review:     Component Value Date/Time   NA 139 04/23/2021 1128   K 3.1 (L) 04/23/2021 1128   CL 106 04/23/2021 1128   CO2 25 04/19/2014 1152   GLUCOSE 118 (H) 04/23/2021 1128   BUN 23 (H) 04/23/2021 1128   CREATININE 0.80 04/23/2021 1128   CALCIUM 9.4 04/19/2014 1152   PROT 8.0 04/19/2014 1152   ALBUMIN 4.1 04/19/2014 1152   AST 18 04/19/2014 1152   ALT 23 04/19/2014 1152   ALKPHOS 92 04/19/2014 1152    BILITOT 0.3 04/19/2014 1152   GFRNONAA >90 04/19/2014 1152   GFRAA >90 04/19/2014 1152       Component Value Date/Time   WBC 7.0 04/19/2014 1152   RBC 5.38 04/19/2014 1152   HGB 16.3 04/23/2021 1128   HGB 14.8 11/25/2008 1520   HCT 48.0 04/23/2021 1128   HCT 43.3 11/25/2008 1520   PLT 152 04/19/2014 1152   PLT 196 11/25/2008 1520   MCV 87.2 04/19/2014 1152  MCV 87.8 11/25/2008 1520   MCH 29.4 04/19/2014 1152   MCHC 33.7 04/19/2014 1152   RDW 13.0 04/19/2014 1152   RDW 13.8 11/25/2008 1520   LYMPHSABS 1.4 04/19/2014 1152   LYMPHSABS 2.0 11/25/2008 1520   MONOABS 1.0 04/19/2014 1152   MONOABS 0.7 11/25/2008 1520   EOSABS 0.1 04/19/2014 1152   EOSABS 0.1 11/25/2008 1520   BASOSABS 0.0 04/19/2014 1152   BASOSABS 0.1 11/25/2008 1520    No results found for: "POCLITH", "LITHIUM"   No results found for: "PHENYTOIN", "PHENOBARB", "VALPROATE", "CBMZ"   .res Assessment: Plan:    Plan:  PDMP reviewed  Prozac 40mg  daily   RTC 6 months   Patient advised to contact office with any questions, adverse effects, or acute worsening in signs and symptoms.  Time spent with patient was 15 minutes. Greater than 50% of face to face time with patient was spent on counseling and coordination of care.    Discussed potential benefits, risk, and side effects of benzodiazepines to include potential risk of tolerance and dependence, as well as possible drowsiness. Advised patient not to drive if experiencing drowsiness and to take lowest possible effective dose to minimize risk of dependence and tolerance.  There are no diagnoses linked to this encounter.   Please see After Visit Summary for patient specific instructions.  Future Appointments  Date Time Provider [place]  02/21/2022  8:40 AM Lareen Mullings, Berdie Ogren, NP CP-CP None  03/01/2022  9:00 AM An[place], [facility] CP-CP None  03/15/2022  9:00 AM [place], [facility] CP-CP None  03/29/2022  9:00 AM [place], [facility] CP-CP None    No orders of the defined types were placed in this encounter.   -------------------------------

## 2022-03-01 ENCOUNTER — Ambulatory Visit (INDEPENDENT_AMBULATORY_CARE_PROVIDER_SITE_OTHER): Payer: BC Managed Care – PPO | Admitting: Mental Health

## 2022-03-01 DIAGNOSIS — F4323 Adjustment disorder with mixed anxiety and depressed mood: Secondary | ICD-10-CM | POA: Diagnosis not present

## 2022-03-01 NOTE — Progress Notes (Signed)
[facility] Psychotherapy Note  Name: Christian [name] Date: 03/01/22 MRN: [id number] DOB: Aug 24, 1975 PCP: Christian [name]., MD  Time spent: 55 minutes  Treatment: Individual Therapy  Mental Status Exam:    Appearance:   Casual     Behavior:  Appropriate  Motor:  Normal  Speech/Language:   Clear and Coherent    Affect:  Full range  Mood:   euthymic  Thought process:  normal  Thought content:    WNL  Sensory/Perceptual disturbances:    WNL  Orientation:  x4  Attention:  Good  Concentration:  Good  Memory:  intact  Fund of knowledge:   WNL  Insight:    good  Judgment:   Good  Impulse Control:  Good   Reported Symptoms:  Irritable, depressed, anxiety, anger outbursts (verbal), tearful, feelings of inadequacy  Risk Assessment:  Danger to Self:  No Self-injurious Behavior: No Danger to Others: No Duty to Warn:no Physical Aggression / Violence:No  Access to Firearms a concern: No  Gang Involvement:No  Patient / guardian was educated about steps to take if suicide or homicide risk level increases between visits: yes While future psychiatric events cannot be accurately predicted, the patient does not currently require acute inpatient psychiatric care and does not currently meet [facility] involuntary commitment criteria.  Subjective:  Patient presents for today's session.  Assess progress, recent events.  Patient shared experiences with his family as they went to a function at his son's college over the weekend.  Shared some ongoing stress in his marital relationship, in part due to challenges with his daughter who has special needs.  Through guided discovery he identified how he feels he and his wife are in some denial about his daughter's future, the struggle to accept that she may not be able to live independently.  Facilitated his identifying how he feels he can work to cope toward gaining some acceptance.  He shared other issues, his sister not communicating  back with him after he has made multiple attempts over the past few months.  He stated he is uncertain why she is having a lack of communication, he has spoken with his mother but did not gain any further clarity.  Ways to cope and care for himself were explored, he continues to set boundaries at times in the relationship with his wife to solicit her help and getting daily test done such as cooking, grocery shopping etc.  Intervention: supportive therapy, motivational interviewing, motivational interviewing   Diagnoses:    ICD-10-CM   1. Adjustment disorder with mixed anxiety and depressed mood  F43.23         Plan: Patient is to use CBT, mindfulness and coping skills,  Patient to follow through with coping skills discussed. Patient to continue to be mindful of his communication and maintaining boundaries to keep stress more manageable.    Long-term goal:    Reduce overall level, frequency, and intensity of the feelings of depression, anxiety and panic evidenced by decreased irritability, negative self talk  from 6 to 7 days/week to 0 to 1 days/week per client report for at least 3 consecutive months.    Short-term goal:  Verbally express understanding of the relationship between feelings of depression, anxiety and their impact on thinking patterns and behaviors. Verbalize an understanding of the role that distorted thinking plays in creating fears, excessive worry, and ruminations. Improve effective communication skills to reduce relational stress Decrease irritability frequency and anger outbursts  Progress: Progressing  Christian [name]  Christian [name], [facility]

## 2022-03-07 DIAGNOSIS — I10 Essential (primary) hypertension: Secondary | ICD-10-CM | POA: Diagnosis not present

## 2022-03-07 DIAGNOSIS — R7301 Impaired fasting glucose: Secondary | ICD-10-CM | POA: Diagnosis not present

## 2022-03-15 ENCOUNTER — Ambulatory Visit (INDEPENDENT_AMBULATORY_CARE_PROVIDER_SITE_OTHER): Payer: BC Managed Care – PPO | Admitting: Mental Health

## 2022-03-15 DIAGNOSIS — F4323 Adjustment disorder with mixed anxiety and depressed mood: Secondary | ICD-10-CM

## 2022-03-15 NOTE — Progress Notes (Signed)
[facility] Psychotherapy Note  Name: Christian [name] Date: 03/15/22 MRN: [id number] DOB: 1976/02/09 PCP: Cleatis Polka., MD  Time spent: 56 minutes  Treatment: Individual Therapy  Mental Status Exam:    Appearance:   Casual     Behavior:  Appropriate  Motor:  Normal  Speech/Language:   Clear and Coherent    Affect:  Full range  Mood:   euthymic  Thought process:  normal  Thought content:    WNL  Sensory/Perceptual disturbances:    WNL  Orientation:  x4  Attention:  Good  Concentration:  Good  Memory:  intact  Fund of knowledge:   WNL  Insight:    good  Judgment:   Good  Impulse Control:  Good   Reported Symptoms:  Irritable, depressed, anxiety, anger outbursts (verbal), tearful, feelings of inadequacy  Risk Assessment:  Danger to Self:  No Self-injurious Behavior: No Danger to Others: No Duty to Warn:no Physical Aggression / Violence:No  Access to Firearms a concern: No  Gang Involvement:No  Patient / guardian was educated about steps to take if suicide or homicide risk level increases between visits: yes While future psychiatric events cannot be accurately predicted, the patient does not currently require acute inpatient psychiatric care and does not currently meet [facility] involuntary commitment criteria.  Subjective:  Patient presents for today's session.  Assessed recent events in progress.  Patient shared more history related to family.  Provided more detail related typically to the relationship with his father, where they continue to have no communication over the past few years.  Details were provided regarding experiences centered around money as he stated this was his father's main focus and locus of control and relationships.  Experiences in the past with family, birthdays and holidays and other events that have led to the strained relationship to be ongoing.  He stated they have not celebrated Christmas together in the past few years  although he is extended an invitation to his mother on holidays to visit with his family.  Facilitated his identifying how he frames thoughts related to how this affects his current relationships with others and his immediate family.  How this plays out in relationship with his wife was further identified.  He stated that currently they are getting along well, some issues at times but an improvement in their relationship over the past 1 to 2 months.  He stated they plan to foster children and have started this process recently.    Intervention: supportive therapy, motivational interviewing, CBT   Diagnoses:    ICD-10-CM   1. Adjustment disorder with mixed anxiety and depressed mood  F43.23          Plan: Patient is to use CBT, mindfulness and coping skills,  Patient to follow through with coping skills discussed. Patient to continue to be mindful of his communication and maintaining boundaries to keep stress more manageable.    Long-term goal:    Reduce overall level, frequency, and intensity of the feelings of depression, anxiety and panic evidenced by decreased irritability, negative self talk  from 6 to 7 days/week to 0 to 1 days/week per client report for at least 3 consecutive months.    Short-term goal:  Verbally express understanding of the relationship between feelings of depression, anxiety and their impact on thinking patterns and behaviors. Verbalize an understanding of the role that distorted thinking plays in creating fears, excessive worry, and ruminations. Improve effective communication skills to reduce relational stress Decrease irritability  frequency and anger outbursts  Progress: Progressing  [place], [facility]

## 2022-03-16 ENCOUNTER — Other Ambulatory Visit (HOSPITAL_COMMUNITY): Payer: Self-pay

## 2022-03-16 MED ORDER — TROKENDI XR 200 MG PO CP24
200.0000 mg | ORAL_CAPSULE | Freq: Every day | ORAL | 5 refills | Status: DC
  Filled 2022-03-16 – 2022-03-18 (×2): qty 30, 30d supply, fill #0

## 2022-03-17 ENCOUNTER — Other Ambulatory Visit (HOSPITAL_COMMUNITY): Payer: Self-pay

## 2022-03-18 ENCOUNTER — Other Ambulatory Visit (HOSPITAL_COMMUNITY): Payer: Self-pay

## 2022-03-24 DIAGNOSIS — G4733 Obstructive sleep apnea (adult) (pediatric): Secondary | ICD-10-CM | POA: Diagnosis not present

## 2022-03-29 ENCOUNTER — Ambulatory Visit (INDEPENDENT_AMBULATORY_CARE_PROVIDER_SITE_OTHER): Payer: BC Managed Care – PPO | Admitting: Mental Health

## 2022-03-29 DIAGNOSIS — F4323 Adjustment disorder with mixed anxiety and depressed mood: Secondary | ICD-10-CM

## 2022-03-29 NOTE — Progress Notes (Signed)
[facility] Psychotherapy Note  Name: Christian [name] Date: 03/29/22 MRN: [id number] DOB: Dec 27, 1975 PCP: Christian [name]., MD  Time spent: 55 minutes  Treatment: Individual Therapy  Mental Status Exam:    Appearance:   Casual     Behavior:  Appropriate  Motor:  Normal  Speech/Language:   Clear and Coherent    Affect:  Full range  Mood:   euthymic  Thought process:  normal  Thought content:    WNL  Sensory/Perceptual disturbances:    WNL  Orientation:  x4  Attention:  Good  Concentration:  Good  Memory:  intact  Fund of knowledge:   WNL  Insight:    good  Judgment:   Good  Impulse Control:  Good   Reported Symptoms:  Irritable, depressed, anxiety, anger outbursts (verbal), tearful, feelings of inadequacy  Risk Assessment:  Danger to Self:  No Self-injurious Behavior: No Danger to Others: No Duty to Warn:no Physical Aggression / Violence:No  Access to Firearms a concern: No  Gang Involvement:No  Patient / guardian was educated about steps to take if suicide or homicide risk level increases between visits: yes While future psychiatric events cannot be accurately predicted, the patient does not currently require acute inpatient psychiatric care and does not currently meet [facility] involuntary commitment criteria.  Subjective:  Patient presents for today's session.  Patient shared recent events in progress where he has had some increased stress at work.  He stated that business is currently slow which affects his monthly income.  He stated there have been some layoffs at work which he was not surprised by due to the industry changes.  He stated his wife tends to worry about finances however, he shares how he tries to assure that they will be okay.  He stated they have had some continued improvements in the relationship, 1 argument about a week ago that they ultimately were able to resolve.  Facilitated his identifying and processing recent stressors,  thoughts and feelings related continue to work with patient from a cognitive behavioral framework.   Intervention: supportive therapy, motivational interviewing, CBT   Diagnoses:    ICD-10-CM   1. Adjustment disorder with mixed anxiety and depressed mood  F43.23           Plan: Patient is to use CBT, mindfulness and coping skills,  Patient to follow through with coping skills discussed. Patient to continue to be mindful of his communication and maintaining boundaries to keep stress more manageable.    Long-term goal:    Reduce overall level, frequency, and intensity of the feelings of depression, anxiety and panic evidenced by decreased irritability, negative self talk  from 6 to 7 days/week to 0 to 1 days/week per client report for at least 3 consecutive months.    Short-term goal:  Verbally express understanding of the relationship between feelings of depression, anxiety and their impact on thinking patterns and behaviors. Verbalize an understanding of the role that distorted thinking plays in creating fears, excessive worry, and ruminations. Improve effective communication skills to reduce relational stress Decrease irritability frequency and anger outbursts  Progress: Progressing  [name] Session, [facility]

## 2022-04-13 ENCOUNTER — Ambulatory Visit: Payer: BC Managed Care – PPO | Admitting: Mental Health

## 2022-04-23 DIAGNOSIS — G4733 Obstructive sleep apnea (adult) (pediatric): Secondary | ICD-10-CM | POA: Diagnosis not present

## 2022-04-26 ENCOUNTER — Ambulatory Visit (INDEPENDENT_AMBULATORY_CARE_PROVIDER_SITE_OTHER): Payer: BC Managed Care – PPO | Admitting: Mental Health

## 2022-04-26 DIAGNOSIS — F4323 Adjustment disorder with mixed anxiety and depressed mood: Secondary | ICD-10-CM | POA: Diagnosis not present

## 2022-04-26 NOTE — Progress Notes (Signed)
[facility] Psychotherapy Note  Name: Christian [name] Date: 04/26/22 MRN: [id number] DOB: 1975-09-25 PCP: Cleatis Polka., MD  Time spent: 55 minutes  Treatment: Individual Therapy  Mental Status Exam:    Appearance:   Casual     Behavior:  Appropriate  Motor:  Normal  Speech/Language:   Clear and Coherent    Affect:  Full range  Mood:   euthymic  Thought process:  normal  Thought content:    WNL  Sensory/Perceptual disturbances:    WNL  Orientation:  x4  Attention:  Good  Concentration:  Good  Memory:  intact  Fund of knowledge:   WNL  Insight:    good  Judgment:   Good  Impulse Control:  Good   Reported Symptoms:  Irritable, depressed, anxiety, anger outbursts (verbal), tearful, feelings of inadequacy  Risk Assessment:  Danger to Self:  No Self-injurious Behavior: No Danger to Others: No Duty to Warn:no Physical Aggression / Violence:No  Access to Firearms a concern: No  Gang Involvement:No  Patient / guardian was educated about steps to take if suicide or homicide risk level increases between visits: yes While future psychiatric events cannot be accurately predicted, the patient does not currently require acute inpatient psychiatric care and does not currently meet [facility] involuntary commitment criteria.  Subjective:  Patient presents for today's session.  Patient shared recent events in progress. He said that him and his wife are doing better overall on their relationship however, he went on the shares a couple of instances where his wife would shut down, get upset primarily due to challenges with their daughter. He shared how she had issues over the last weekend where they had to unless his mother for assistance as their babysitter quit due to their daughters define behaviors. Patient identified the need to continue to be mindful of his reactions, he does not feel he gets the same support reciprocated to him from his wife when he is stressed.  Ways to manage his frustrations, communicate effectively in these instances was explored collaboratively.   Intervention: supportive therapy, motivational interviewing, CBT   Diagnoses:  No diagnosis found.   Plan: Patient is to use CBT, mindfulness and coping skills,  Patient to follow through with coping skills discussed. Patient to continue to be mindful of his communication and maintaining boundaries to keep stress more manageable.    Long-term goal:    Reduce overall level, frequency, and intensity of the feelings of depression, anxiety and panic evidenced by decreased irritability, negative self talk  from 6 to 7 days/week to 0 to 1 days/week per client report for at least 3 consecutive months.    Short-term goal:  Verbally express understanding of the relationship between feelings of depression, anxiety and their impact on thinking patterns and behaviors. Verbalize an understanding of the role that distorted thinking plays in creating fears, excessive worry, and ruminations. Improve effective communication skills to reduce relational stress Decrease irritability frequency and anger outbursts  Progress: Progressing  [name] Session, [facility]

## 2022-04-27 DIAGNOSIS — M5134 Other intervertebral disc degeneration, thoracic region: Secondary | ICD-10-CM | POA: Diagnosis not present

## 2022-05-12 ENCOUNTER — Ambulatory Visit (INDEPENDENT_AMBULATORY_CARE_PROVIDER_SITE_OTHER): Payer: BC Managed Care – PPO | Admitting: Mental Health

## 2022-05-12 DIAGNOSIS — F4323 Adjustment disorder with mixed anxiety and depressed mood: Secondary | ICD-10-CM | POA: Diagnosis not present

## 2022-05-12 NOTE — Progress Notes (Signed)
[facility] Psychotherapy Note  Name: Christian [name] Date: 05/12/22 MRN: [id number] DOB: 14-Dec-1975 PCP: Cleatis Polka., MD  Time spent: 54 minutes  Treatment: Individual Therapy  Mental Status Exam:    Appearance:   Casual     Behavior:  Appropriate  Motor:  Normal  Speech/Language:   Clear and Coherent    Affect:  Full range  Mood:   euthymic  Thought process:  normal  Thought content:    WNL  Sensory/Perceptual disturbances:    WNL  Orientation:  x4  Attention:  Good  Concentration:  Good  Memory:  intact  Fund of knowledge:   WNL  Insight:    good  Judgment:   Good  Impulse Control:  Good   Reported Symptoms:  Irritable, depressed, anxiety, anger outbursts (verbal), tearful, feelings of inadequacy  Risk Assessment:  Danger to Self:  No Self-injurious Behavior: No Danger to Others: No Duty to Warn:no Physical Aggression / Violence:No  Access to Firearms a concern: No  Gang Involvement:No  Patient / guardian was educated about steps to take if suicide or homicide risk level increases between visits: yes While future psychiatric events cannot be accurately predicted, the patient does not currently require acute inpatient psychiatric care and does not currently meet [facility] involuntary commitment criteria.  Subjective:  Patient presents for today's session.  Patient shared events, experiences with family over the Christmas holiday.  He went on to share feelings related to attempting to ask his sister and mother to come to his home to visit.  He stated that his sister has been difficult to communicate with, not responding to his attempts to arrange a date and time for them to come over.  He stated he continues to have a strained relationship with his father, they are not speaking to one another for the past 4 years.  He stated that his mother is supportive, does not "take sides".  Patient identified wanting to have a relationship with his sister,  unsure exactly as to why they have had more of a distance between them over the past few years.  He said he has made attempts to explore with her however, she does not engage in discussions to give him further understanding.  He stated he has been working to manage his anger and frustrations in relationships, how he communicates.  He stated that his oldest son is home from college and how he has been frustrated with how he speaks to his sister.  Facilitated patient and framing thoughts regarding how he wants to handle the situations with his son with effective communication.   Intervention: supportive therapy, motivational interviewing, CBT   Diagnoses:    ICD-10-CM   1. Adjustment disorder with mixed anxiety and depressed mood  F43.23        Plan: Patient is to use CBT, mindfulness and coping skills,  Patient to follow through with coping skills discussed. Patient to continue to be mindful of his communication and maintaining boundaries to keep stress more manageable.    Long-term goal:    Reduce overall level, frequency, and intensity of the feelings of depression, anxiety and panic evidenced by decreased irritability, negative self talk  from 6 to 7 days/week to 0 to 1 days/week per client report for at least 3 consecutive months.    Short-term goal:  Verbally express understanding of the relationship between feelings of depression, anxiety and their impact on thinking patterns and behaviors. Verbalize an understanding of the role that  distorted thinking plays in creating fears, excessive worry, and ruminations. Improve effective communication skills to reduce relational stress Decrease irritability frequency and anger outbursts  Progress: Progressing  [name] Session, [facility]

## 2022-06-06 ENCOUNTER — Ambulatory Visit (INDEPENDENT_AMBULATORY_CARE_PROVIDER_SITE_OTHER): Payer: No Typology Code available for payment source | Admitting: Mental Health

## 2022-06-06 DIAGNOSIS — F4323 Adjustment disorder with mixed anxiety and depressed mood: Secondary | ICD-10-CM

## 2022-06-06 NOTE — Progress Notes (Signed)
[facility] Psychotherapy Note  Name: Christian [name] Date: 06/06/22 MRN: [id number] DOB: [birth year] PCP: Ginger Organ., MD  Time spent: 53 minutes  Treatment: Individual Therapy  Mental Status Exam:    Appearance:   Casual     Behavior:  Appropriate  Motor:  Normal  Speech/Language:   Clear and Coherent    Affect:  Full range  Mood:   euthymic  Thought process:  normal  Thought content:    WNL  Sensory/Perceptual disturbances:    WNL  Orientation:  x4  Attention:  Good  Concentration:  Good  Memory:  intact  Fund of knowledge:   WNL  Insight:    good  Judgment:   Good  Impulse Control:  Good   Reported Symptoms:  Irritable, depressed, anxiety, anger outbursts (verbal), tearful, feelings of inadequacy  Risk Assessment:  Danger to Self:  No Self-injurious Behavior: No Danger to Others: No Duty to Warn:no Physical Aggression / Violence:No  Access to Firearms a concern: No  Gang Involvement:No  Patient / guardian was educated about steps to take if suicide or homicide risk level increases between visits: yes While future psychiatric events cannot be accurately predicted, the patient does not currently require acute inpatient psychiatric care and does not currently meet [facility] involuntary commitment criteria.  Subjective:  Patient presents for today's session.  Assess progress since last visit.  Reports some concerns about his son who is in college.  He stated that he is struggling socially, partially due to issues with an ex-girlfriend.  Facilitated patient sharing relational progress with his wife where he stated they have not having this money arguments recently.  He stated that she expressed concern about his own self-care in terms of getting proper rest at night particularly due to his sleep apnea.  He stated that he has difficulty wearing his CPAP mask encouraged him to reach out to his provider to discuss options.  Explored collaboratively ways to  cope and care for himself where he plans to follow through with going to bed in a timely manner more consistently in the evenings and benefits of exercise.  He stated that he has been isolating more in the past couple of years, typically working from having, stating that he often wants to avoid people expressing feeling frustrated easily when around others at times.  He identified also the stressors at work as there has been more challenges recently with new conditions required by upper management.  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    Intervention: supportive therapy, motivational interviewing, CBT   Diagnoses:    ICD-10-CM   1. Adjustment disorder with mixed anxiety and depressed mood  F43.23         Plan: Patient is to use CBT, mindfulness and coping skills,  Patient to follow through with coping skills discussed. Patient to continue to be mindful of his communication and maintaining boundaries to keep stress more manageable.    Long-term goal:    Reduce overall level, frequency, and intensity of the feelings of depression, anxiety and panic evidenced by decreased irritability, negative self talk  from 6 to 7 days/week to 0 to 1 days/week per client report for at least 3 consecutive months.    Short-term goal:  Verbally express understanding of the relationship  between feelings of depression, anxiety and their impact on thinking patterns and behaviors. Verbalize an understanding of the role that distorted thinking plays in creating fears, excessive worry, and ruminations. Improve effective communication skills to reduce relational stress Decrease irritability frequency and anger outbursts  Progress: Progressing  Anson Oregon, [place]

## 2022-06-13 ENCOUNTER — Other Ambulatory Visit (HOSPITAL_COMMUNITY): Payer: Self-pay

## 2022-06-20 ENCOUNTER — Ambulatory Visit (INDEPENDENT_AMBULATORY_CARE_PROVIDER_SITE_OTHER): Payer: No Typology Code available for payment source | Admitting: Mental Health

## 2022-06-20 DIAGNOSIS — F411 Generalized anxiety disorder: Secondary | ICD-10-CM

## 2022-06-20 NOTE — Progress Notes (Signed)
[facility] Psychotherapy Note  Name: Christian [name] Date: 06/20/22 MRN: [id number] DOB: 08/22/1975 PCP: Ginger Organ., MD  Time spent: 54 minutes  Treatment: Individual Therapy  Mental Status Exam:    Appearance:   Casual     Behavior:  Appropriate  Motor:  Normal  Speech/Language:   Clear and Coherent    Affect:  Full range  Mood:   euthymic  Thought process:  normal  Thought content:    WNL  Sensory/Perceptual disturbances:    WNL  Orientation:  x4  Attention:  Good  Concentration:  Good  Memory:  intact  Fund of knowledge:   WNL  Insight:    good  Judgment:   Good  Impulse Control:  Good   Reported Symptoms:  Irritable, depressed, anxiety, anger outbursts (verbal), tearful, feelings of inadequacy  Risk Assessment:  Danger to Self:  No Self-injurious Behavior: No Danger to Others: No Duty to Warn:no Physical Aggression / Violence:No  Access to Firearms a concern: No  Gang Involvement:No  Patient / guardian was educated about steps to take if suicide or homicide risk level increases between visits: yes While future psychiatric events cannot be accurately predicted, the patient does not currently require acute inpatient psychiatric care and does not currently meet [facility] involuntary commitment criteria.  Subjective:  Patient presents for today's session.  Assessed progress.  Patient shared ongoing stressors related to work.  He stated that he has had challenges with meeting his monthly quotas, along with many other coworkers in the same position.  Although patient is highly effective and successful at his job historically, and recognizes recent challenges at work related to factors that he cannot control, he nonetheless worries about his job and shared how the anxiety has reached a point on some mornings where he is experience nausea.  He stated that he spoke to his supervisor who assured him that he was in no position to lose his job.  Facilitated  patient continuing to reframe and focus on successes he has had with his job, positive comments he has received from his supervisor.  He stated that his wife has a tendency to worry which can exacerbate his stress understandably.  He reports their relationship has improved recently, some improved communication between them has resulted in less stress for patient daily  Intervention: supportive therapy, motivational interviewing, CBT   Diagnoses:    ICD-10-CM   1. Generalized anxiety disorder  F41.1          Plan: Patient is to use CBT, mindfulness and coping skills,  Patient to follow through with coping skills discussed. Patient to continue to be mindful of his communication and maintaining boundaries to keep stress more manageable.    Long-term goal:    Reduce overall level, frequency, and intensity of the feelings of depression, anxiety and panic evidenced by decreased irritability, negative self talk  from 6 to 7 days/week to 0 to 1 days/week per client report for at least 3 consecutive months.    Short-term goal:  Verbally express understanding of the relationship between feelings of depression, anxiety and their impact on thinking patterns and behaviors. Verbalize an understanding of the role that distorted thinking plays in creating fears, excessive worry, and ruminations. Improve effective communication skills to reduce relational stress Decrease irritability frequency and anger outbursts  Progress: Progressing  Anson Oregon, [facility]

## 2022-06-22 ENCOUNTER — Other Ambulatory Visit (HOSPITAL_COMMUNITY): Payer: Self-pay

## 2022-06-22 ENCOUNTER — Other Ambulatory Visit: Payer: Self-pay

## 2022-06-22 MED ORDER — AJOVY 225 MG/1.5ML ~~LOC~~ SOSY
225.0000 mg | PREFILLED_SYRINGE | SUBCUTANEOUS | 11 refills | Status: DC
  Filled 2022-06-22 – 2022-07-05 (×2): qty 1.5, 30d supply, fill #0
  Filled 2022-08-16: qty 1.5, 30d supply, fill #1
  Filled 2022-09-10: qty 1.5, 30d supply, fill #2
  Filled 2022-10-19: qty 1.5, 30d supply, fill #3
  Filled 2022-12-07: qty 1.5, 30d supply, fill #4
  Filled 2023-01-10: qty 1.5, 30d supply, fill #5
  Filled 2023-02-25: qty 1.5, 30d supply, fill #6
  Filled 2023-03-23: qty 1.5, 30d supply, fill #7
  Filled 2023-04-26: qty 1.5, 30d supply, fill #8
  Filled 2023-05-22: qty 1.5, 30d supply, fill #9
  Filled 2023-06-15: qty 1.5, 30d supply, fill #10

## 2022-06-22 MED ORDER — VALSARTAN-HYDROCHLOROTHIAZIDE 160-25 MG PO TABS
1.0000 | ORAL_TABLET | Freq: Every day | ORAL | 3 refills | Status: DC
Start: 2022-06-22 — End: 2023-04-26
  Filled 2022-06-22 – 2022-07-05 (×2): qty 90, 90d supply, fill #0
  Filled 2022-10-06: qty 90, 90d supply, fill #1
  Filled 2022-11-14 – 2023-01-12 (×3): qty 90, 90d supply, fill #2
  Filled 2023-03-23 – 2023-03-27 (×2): qty 90, 90d supply, fill #3

## 2022-07-04 ENCOUNTER — Other Ambulatory Visit (HOSPITAL_COMMUNITY): Payer: Self-pay

## 2022-07-05 ENCOUNTER — Other Ambulatory Visit (HOSPITAL_COMMUNITY): Payer: Self-pay

## 2022-07-06 ENCOUNTER — Ambulatory Visit: Payer: No Typology Code available for payment source | Admitting: Mental Health

## 2022-07-06 DIAGNOSIS — F411 Generalized anxiety disorder: Secondary | ICD-10-CM

## 2022-07-06 NOTE — Progress Notes (Signed)
[facility] Psychotherapy Note  Name: Christian [name] Date: 07/06/22 MRN: [id number] DOB: 1975/11/15 PCP: Ginger Organ., MD  Time spent: 45 minutes  Treatment: Individual Therapy  Mental Status Exam:    Appearance:   Casual     Behavior:  Appropriate  Motor:  Normal  Speech/Language:   Clear and Coherent    Affect:  Full range  Mood:   euthymic  Thought process:  normal  Thought content:    WNL  Sensory/Perceptual disturbances:    WNL  Orientation:  x4  Attention:  Good  Concentration:  Good  Memory:  intact  Fund of knowledge:   WNL  Insight:    good  Judgment:   Good  Impulse Control:  Good   Reported Symptoms:  Irritable, depressed, anxiety, anger outbursts (verbal), tearful, feelings of inadequacy  Risk Assessment:  Danger to Self:  No Self-injurious Behavior: No Danger to Others: No Duty to Warn:no Physical Aggression / Violence:No  Access to Firearms a concern: No  Gang Involvement:No  Patient / guardian was educated about steps to take if suicide or homicide risk level increases between visits: yes While future psychiatric events cannot be accurately predicted, the patient does not currently require acute inpatient psychiatric care and does not currently meet [facility] involuntary commitment criteria.  Subjective:  Patient presents for today's session.  Assessed progress.  Patient shared recent stressors related to his marriage.  Most of the session was centered around providing details related to disagreements he and his wife have had recently.  He stated that she was able to go on a beach trip with a friend however, upon her return she was highly critical of how he kept the house.  Patient stated that he maintained the house, cleaning the kitchen daily, keeping up with the laundry, taking the kids to their sporting events and other school activities [name].  He stated this is a chronic issue where she can be highly critical of him and  disregards the efforts he puts in such as cleaning and cooking daily along with working full-time.  Facilitated his identifying ways to set interpersonal boundaries as he identified how he does not want to engage in arguments with his wife but also the need to be assertive and give his opinions when needed.    Intervention: supportive therapy, motivational interviewing    Diagnoses:    ICD-10-CM   1. Generalized anxiety disorder  F41.1           Plan: Patient is to use CBT, mindfulness and coping skills,  Patient to follow through with coping skills discussed. Patient to continue to be mindful of his communication and maintaining boundaries to keep stress more manageable.    Long-term goal:    Reduce overall level, frequency, and intensity of the feelings of depression, anxiety and panic evidenced by decreased irritability, negative self talk  from 6 to 7 days/week to 0 to 1 days/week per client report for at least 3 consecutive months.    Short-term goal:  Verbally express understanding of the relationship between feelings of depression, anxiety and their impact on thinking patterns and behaviors. Verbalize an understanding of the role that distorted thinking plays in creating fears, excessive worry, and ruminations. Improve effective communication skills to reduce relational stress Decrease irritability frequency and anger outbursts  Progress: Progressing  Anson [place], [facility]

## 2022-07-11 ENCOUNTER — Other Ambulatory Visit (HOSPITAL_COMMUNITY): Payer: Self-pay

## 2022-07-19 ENCOUNTER — Other Ambulatory Visit (HOSPITAL_COMMUNITY): Payer: Self-pay

## 2022-07-21 ENCOUNTER — Ambulatory Visit: Payer: No Typology Code available for payment source | Admitting: Mental Health

## 2022-07-21 DIAGNOSIS — F411 Generalized anxiety disorder: Secondary | ICD-10-CM

## 2022-07-21 NOTE — Progress Notes (Signed)
[facility] Psychotherapy Note  Name: CHANCETON BOOHER Date: 07/21/22 MRN: [id number] DOB: 1975-08-11 PCP: Ginger Organ., MD  Time spent:49 minutes  Treatment: Individual Therapy  Mental Status Exam:    Appearance:   Casual     Behavior:  Appropriate  Motor:  Normal  Speech/Language:   Clear and Coherent    Affect:  Full range  Mood:   euthymic  Thought process:  normal  Thought content:    WNL  Sensory/Perceptual disturbances:    WNL  Orientation:  x4  Attention:  Good  Concentration:  Good  Memory:  intact  Fund of knowledge:   WNL  Insight:    good  Judgment:   Good  Impulse Control:  Good   Reported Symptoms:  Irritable, depressed, anxiety, anger outbursts (verbal), tearful, feelings of inadequacy  Risk Assessment:  Danger to Self:  No Self-injurious Behavior: No Danger to Others: No Duty to Warn:no Physical Aggression / Violence:No  Access to Firearms a concern: No  Gang Involvement:No  Patient / guardian was educated about steps to take if suicide or homicide risk level increases between visits: yes While future psychiatric events cannot be accurately predicted, the patient does not currently require acute inpatient psychiatric care and does not currently meet [facility] involuntary commitment criteria.  Subjective:  Patient presents for today's session.  Assessed progress over the last 2 weeks.  Patient shared how he has had increased anxiety, continued to focus on finances.  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  He went on to share specifics, cautiously optimistic about other changes that may occur in the organization that may improve productivity for the company and in his area of specialty specifically.  He shared the challenge of getting needed support from his wife, where he went on to share how she typically can have "melt downs" which ultimately  make him feel even more down, depressed and anxious about the situation.  He shared how he tries to be supportive when his wife is upset and he just needs this reciprocated sometimes.  He identified how he is maintained a boundary, sleeping in separate bedrooms to ensure that he gets enough rest given his sleep apnea and also to avoid having any further discussions particularly in the middle of the night if his wife is awake when he is.  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     Intervention: supportive therapy, motivational interviewing    Diagnoses:    ICD-10-CM   1. Generalized anxiety disorder  F41.1            Plan: Patient is to use CBT, mindfulness and coping skills,  Patient to follow through with coping skills discussed. Patient to continue to be mindful of his communication and maintaining boundaries to keep stress more manageable.    Long-term goal:    Reduce overall level, frequency, and intensity of the feelings of depression, anxiety and panic evidenced by decreased irritability, negative self talk  from 6 to 7 days/week to 0 to 1 days/week per client report for at least 3 consecutive months.    Short-term goal:  Verbally express understanding of the relationship between feelings of depression, anxiety and their impact on thinking patterns and behaviors. Verbalize an  understanding of the role that distorted thinking plays in creating fears, excessive worry, and ruminations. Improve effective communication skills to reduce relational stress Decrease irritability frequency and anger outbursts  Progress: Progressing  [place], [facility]

## 2022-08-11 ENCOUNTER — Ambulatory Visit: Payer: No Typology Code available for payment source | Admitting: Mental Health

## 2022-08-23 ENCOUNTER — Ambulatory Visit (INDEPENDENT_AMBULATORY_CARE_PROVIDER_SITE_OTHER): Payer: No Typology Code available for payment source | Admitting: Adult Health

## 2022-08-23 ENCOUNTER — Encounter: Payer: Self-pay | Admitting: Adult Health

## 2022-08-23 DIAGNOSIS — G4700 Insomnia, unspecified: Secondary | ICD-10-CM

## 2022-08-23 DIAGNOSIS — F4323 Adjustment disorder with mixed anxiety and depressed mood: Secondary | ICD-10-CM

## 2022-08-23 MED ORDER — HYDROXYZINE HCL 25 MG PO TABS: ORAL_TABLET | ORAL | 5 refills | Status: DC

## 2022-08-23 MED ORDER — FLUOXETINE HCL 40 MG PO CAPS: 40.0000 mg | ORAL_CAPSULE | Freq: Every day | ORAL | 1 refills | Status: DC

## 2022-08-23 NOTE — Progress Notes (Signed)
Christian [name] [id number] 29-Dec-1975 47 y.o.  Subjective:   Patient ID:  Christian [name] is a 47 y.o. (DOB Mar 13, 1976) male.  Chief Complaint: No chief complaint on file.   HPI Christian [name] presents to the office today for follow-up of adjustment disorder.  Describes mood today as "a little better". Pleasant. Denies tearfulness. Mood symptoms - denies depression, anxiety. Denies panic attacks. Reports irritability at times - "more situational". Denies worry, rumination, and over thinking. Mood is consistent. Feels like the Prozac at 40mg  works well for him. Willing to consider options for sleep. Improved interest and motivation. Taking medications as prescribed.  Energy levels stable. Active, does not have a regular exercise routine. Walking dogs. Enjoys some usual interests and activities. Married. Lives with wife and 3 children - one son away at college [employer] [name]. Spending time with family. Appetite suppression. Weight loss - 260's from 274 pounds. Restarted Wegovy. Reports sleeping difficulties. Averages 8 hours of broken sleep. Denies daytime napping. Having difficulties using CPAP at night - waking up feeling like he is "suffocating". Focus and concentration stable. Completing tasks. Managing aspects of household. 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   Denies SI or HI.  Denies AH or VH. Denies self harm. Denies substance use.  Previous medication trials: Christian [name]   Flowsheet Row Admission (Discharged) from 04/23/2021 in [facility]  C-SS[facility] RISK CATEGORY No Risk        Review of Systems:  Review of Systems  Musculoskeletal:  Negative for gait problem.  Neurological:  Negative for tremors.  Psychiatric/Behavioral:         Please refer to HPI    Medications: I have reviewed the patient's current medications.  Current Outpatient Medications  Medication Sig Dispense Refill   baclofen (LIORESAL) 10 MG tablet Take 10 mg by mouth 3 (three) times daily as  needed. Patient takes once daily at bedtime prn.     clorazepate (TRANXENE) 3.75 MG tablet Take 1 tablet (3.75 mg total) by mouth 2 (two) times daily as needed for anxiety. (Patient taking differently: Take 3.75 mg by mouth 2 (two) times daily as needed for anxiety. Rarely takes) 30 tablet 3   FLUoxetine (PROZAC) 40 MG capsule Take 1 capsule (40 mg total) by mouth daily. 90 capsule 1   Fremanezumab-vfrm (AJOVY) 225 MG/1.5ML SOSY Inject 225 mg into the skin every 30 (thirty) days. 1.5 mL 11   lisinopril-hydrochlorothiazide (ZESTORETIC) 10-12.5 MG tablet Take 1 tablet by mouth at bedtime.     oxybutynin (DITROPAN) 5 MG tablet Take 1 tablet (5 mg total) by mouth every 8 (eight) hours as needed for bladder spasms. 15 tablet 1   rizatriptan (MAXALT) 10 MG tablet Take 10 mg by mouth as needed.     rosuvastatin (CRESTOR) 10 MG tablet Take 10 mg by mouth at bedtime.     Semaglutide-Weight Management 0.5 MG/0.5ML SOAJ Inject 0.5 mg into the skin once a week. 2 mL 0   Semaglutide-Weight Management 1 MG/0.5ML SOAJ Inject 1 mg into the skin once a week. 2 mL 11   Semaglutide-Weight Management 1.7 MG/0.75ML SOAJ Inject 1.7 mg into the skin once a week. 3 mL 0   TROKENDI XR 200 MG CP24 Take 1 capsule by mouth at bedtime.     TROKENDI XR 200 MG CP24 Take 1 capsule (200 mg) by mouth daily. 30 capsule 5   TROKENDI XR 200 MG CP24 Take 1 capsule (200 mg) by mouth daily. 30 capsule 5  TROKENDI XR 200 MG CP24 Take 200 mg by mouth daily. 30 capsule 5   valsartan-hydrochlorothiazide (DIOVAN-HCT) 160-25 MG tablet Take 1 tablet by mouth daily. 90 tablet 3   No current facility-administered medications for this visit.    Medication Side Effects: None  Allergies:  Allergies  Allergen Reactions   Lisinopril Cough    Past Medical History:  Diagnosis Date   Abdominal mass 2022   Pt had an abdominal mass abcess treated with antibiotics. Mass reoccurred. Surgery was done to remove abcess on 04/06/21 by Dr. Karie [name]  Gross.   Depression    Only during Covid. Pt is better and off all medication as of 04/23/2019.   Family history of adverse reaction to anesthesia    Pt stated that his mother has had severe N & V after anesthesia. He has never had a problem per pt.   Hypertension    Meningitis    spinal meningitis 1980   Metabolic syndrome    Migraine headache    hx of chronic migraines   Obesity    Other intervertebral disc degeneration, thoracic region    Patient has back pain due to an old injury. He received an epidural injection in his back on 04/22/21.   Prediabetes    last blood drawn in 01/2021 showed prediabetes per pt   Sleep apnea    Patient was diagnosed with sleep apnea from a 02/04/2019 home study. He was unable to tolerate a CPAP.   Snoring    Wears glasses     Past Medical History, Surgical history, Social history, and Family history were reviewed and updated as appropriate.   Please see review of systems for further details on the patient's review from today.   Objective:   Physical Exam:  There were no vitals taken for this visit.  Physical Exam Constitutional:      General: He is not in acute distress. Musculoskeletal:        General: No deformity.  Neurological:     Mental Status: He is alert and oriented to person, place, and time.     Coordination: Coordination normal.  Psychiatric:        Attention and Perception: Attention and perception normal. He does not perceive auditory or visual hallucinations.        Mood and Affect: Mood normal. Mood is not anxious or depressed. Affect is not labile, blunt, angry or inappropriate.        Speech: Speech normal.        Behavior: Behavior normal.        Thought Content: Thought content normal. Thought content is not paranoid or delusional. Thought content does not include homicidal or suicidal ideation. Thought content does not include homicidal or suicidal plan.        Cognition and Memory: Cognition and memory normal.         Judgment: Judgment normal.     Comments: Insight intact     Lab Review:     Component Value Date/Time   NA 139 04/23/2021 1128   K 3.1 (L) 04/23/2021 1128   CL 106 04/23/2021 1128   CO2 25 04/19/2014 1152   GLUCOSE 118 (H) 04/23/2021 1128   BUN 23 (H) 04/23/2021 1128   CREATININE 0.80 04/23/2021 1128   CALCIUM 9.4 04/19/2014 1152   PROT 8.0 04/19/2014 1152   ALBUMIN 4.1 04/19/2014 1152   AST 18 04/19/2014 1152   ALT 23 04/19/2014 1152   ALKPHOS 92 04/19/2014 1152   BILITOT  0.3 04/19/2014 1152   GFRNONAA >90 04/19/2014 1152   GFRAA >90 04/19/2014 1152       Component Value Date/Time   WBC 7.0 04/19/2014 1152   RBC 5.38 04/19/2014 1152   HGB 16.3 04/23/2021 1128   HGB 14.8 11/25/2008 1520   HCT 48.0 04/23/2021 1128   HCT 43.3 11/25/2008 1520   PLT 152 04/19/2014 1152   PLT 196 11/25/2008 1520   MCV 87.2 04/19/2014 1152   MCV 87.8 11/25/2008 1520   MCH 29.4 04/19/2014 1152   MCHC 33.7 04/19/2014 1152   RDW 13.0 04/19/2014 1152   RDW 13.8 11/25/2008 1520   LYMPHSABS 1.4 04/19/2014 1152   LYMPHSABS 2.0 11/25/2008 1520   MONOABS 1.0 04/19/2014 1152   MONOABS 0.7 11/25/2008 1520   EOSABS 0.1 04/19/2014 1152   EOSABS 0.1 11/25/2008 1520   BASOSABS 0.0 04/19/2014 1152   BASOSABS 0.1 11/25/2008 1520    No results found for: "POCLITH", "LITHIUM"   No results found for: "PHENYTOIN", "PHENOBARB", "VALPROATE", "CBMZ"   .res Assessment: Plan:    Plan:  PDMP reviewed  Prozac 40mg  daily   Add Hydroxyzine 25mg  - 1 to 2 tablets at bedtime.  RTC 6 months   Patient advised to contact office with any questions, adverse effects, or acute worsening in signs and symptoms.  Time spent with patient was 15 minutes. Greater than 50% of face to face time with patient was spent on counseling and coordination of care.    Discussed potential benefits, risk, and side effects of benzodiazepines to include potential risk of tolerance and dependence, as well as possible  drowsiness. Advised patient not to drive if experiencing drowsiness and to take lowest possible effective dose to minimize risk of dependence and tolerance.  There are no diagnoses linked to this encounter.   Please see After Visit Summary for patient specific instructions.  Future Appointments  Date Time Provider Department [name]  08/25/2022  9:00 AM [name] Session, [name] CP-CP None  09/08/2022  9:00 AM [name] Session, [name] CP[facility] None    No orders of the defined types were placed in this encounter.   -------------------------------

## 2022-08-25 ENCOUNTER — Ambulatory Visit: Payer: No Typology Code available for payment source | Admitting: Mental Health

## 2022-08-25 DIAGNOSIS — F4323 Adjustment disorder with mixed anxiety and depressed mood: Secondary | ICD-10-CM | POA: Diagnosis not present

## 2022-08-25 NOTE — Progress Notes (Signed)
[facility] Psychotherapy Note  Name: Christian [name] Date: 08/25/22 MRN: [id number] DOB: Feb 21, 1976 PCP: Christian [name]., MD  Time spent:  52 minutes  Treatment: Individual Therapy  Mental Status Exam:    Appearance:   Casual     Behavior:  Appropriate  Motor:  Normal  Speech/Language:   Clear and Coherent    Affect:  Full range  Mood:   Pleasant, sad  Thought process:  normal  Thought content:    WNL  Sensory/Perceptual disturbances:    WNL  Orientation:  x4  Attention:  Good  Concentration:  Good  Memory:  intact  Fund of knowledge:   WNL  Insight:    good  Judgment:   Good  Impulse Control:  Good   Reported Symptoms:  Irritable, depressed, anxiety, anger outbursts (verbal), tearful, feelings of inadequacy  Risk Assessment:  Danger to Self:  No Self-injurious Behavior: No Danger to Others: No Duty to Warn:no Physical Aggression / Violence:No  Access to Firearms a concern: No  Gang Involvement:No  Patient / guardian was educated about steps to take if suicide or homicide risk level increases between visits: yes While future psychiatric events cannot be accurately predicted, the patient does not currently require acute inpatient psychiatric care and does not currently meet [facility] involuntary commitment criteria.  Subjective:  Patient presents for today's session.  Assessed progress.  He shared how he has been struggling with his sleep, started new medication recently and is hopeful it might improve his tendency to wake through the night.  Most of the session was centered around challenges with his daughter's behaviors.  His daughter, who is on the autistic spectrum, struggles chronically with defiant behaviors, and various aspects of daily functioning.  He stated that at some point she will probably need a group home placements after she turns 18.  He shared the emotional toll it takes on him, his wife and his younger son.  Provide support throughout,  facilitating patient identifying needs to utilizing motivational interviewing.  Ways to cope and care for himself while he tries to also be a support to his wife was shared.    Intervention: supportive therapy, motivational interviewing    Diagnoses:    ICD-10-CM   1. Adjustment disorder with mixed anxiety and depressed mood  F43.23         Plan: Patient is to use CBT, mindfulness and coping skills,  Patient to follow through with coping skills discussed. Patient to continue to be mindful of his communication and maintaining boundaries to keep stress more manageable.    Long-term goal:    Reduce overall level, frequency, and intensity of the feelings of depression, anxiety and panic evidenced by decreased irritability, negative self talk  from 6 to 7 days/week to 0 to 1 days/week per client report for at least 3 consecutive months.    Short-term goal:  Verbally express understanding of the relationship between feelings of depression, anxiety and their impact on thinking patterns and behaviors. Verbalize an understanding of the role that distorted thinking plays in creating fears, excessive worry, and ruminations. Improve effective communication skills to reduce relational stress Decrease irritability frequency and anger outbursts  Progress: Progressing  [name] Session, [facility]

## 2022-09-08 ENCOUNTER — Ambulatory Visit (INDEPENDENT_AMBULATORY_CARE_PROVIDER_SITE_OTHER): Payer: No Typology Code available for payment source | Admitting: Mental Health

## 2022-09-08 DIAGNOSIS — F4323 Adjustment disorder with mixed anxiety and depressed mood: Secondary | ICD-10-CM

## 2022-09-08 NOTE — Progress Notes (Signed)
[facility] Psychotherapy Note  Name: Christian [name] Date: 09/08/22 MRN: [id number] DOB: 07/17/1975 PCP: Cleatis Polka., MD  Time spent:  54 minutes  Treatment: Individual Therapy  Mental Status Exam:    Appearance:   Casual     Behavior:  Appropriate  Motor:  Normal  Speech/Language:   Clear and Coherent    Affect:  Full range  Mood:   Pleasant, sad  Thought process:  normal  Thought content:    WNL  Sensory/Perceptual disturbances:    WNL  Orientation:  x4  Attention:  Good  Concentration:  Good  Memory:  intact  Fund of knowledge:   WNL  Insight:    good  Judgment:   Good  Impulse Control:  Good   Reported Symptoms:  Irritable, depressed, anxiety, anger outbursts (verbal), tearful, feelings of inadequacy  Risk Assessment:  Danger to Self:  No Self-injurious Behavior: No Danger to Others: No Duty to Warn:no Physical Aggression / Violence:No  Access to Firearms a concern: No  Gang Involvement:No  Patient / guardian was educated about steps to take if suicide or homicide risk level increases between visits: yes While future psychiatric events cannot be accurately predicted, the patient does not currently require acute inpatient psychiatric care and does not currently meet [facility] involuntary commitment criteria.  Subjective:  Patient presents for today's session.  Assessed progress over the last few weeks.  He shared ongoing stress at home, somehow arguments between him and his wife regarding ongoing care for their daughter.  He stated that he has tried to identify and set boundaries with his wife to get adequate sleep, sleeping in a different room in the house going to bed earlier trying to get rest as he is often tired.  He stated he started a recent sleep medication prescribed which she feels has been somewhat helpful.  We encouraged him to use his sleep apnea machine as he stated that he has not been using it due to not setting up the room fully  as of yet.  Facilitated his identifying needs utilizing motivational interviewing, where he identified the need to continue to set boundaries with his wife as he is to navigate his full-time job while also maintaining many duties at home such as cooking and cleaning in the kitchen most nights.  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  Patient stated that he hopes it goes well but expresses concern about being able to meet all their needs logistically as he often is taking his son to football practice and his daughter to school.  He stated that he is hopeful his wife will be able to adjust and also help in meeting their needs consistently.    Intervention: supportive therapy, motivational interviewing    Diagnoses:    ICD-10-CM   1. Adjustment disorder with mixed anxiety and depressed mood  F43.23          Plan: Patient is to use CBT, mindfulness and coping skills,  Patient to follow through with coping skills discussed. Patient to continue to be mindful of his communication and maintaining boundaries to keep stress more manageable.    Long-term goal:    Reduce overall level, frequency, and intensity of the feelings of depression, anxiety and panic evidenced by decreased irritability, negative self talk  from 6 to 7 days/week to 0 to 1 days/week per client report for at least  3 consecutive months.    Short-term goal:  Verbally express understanding of the relationship between feelings of depression, anxiety and their impact on thinking patterns and behaviors. Verbalize an understanding of the role that distorted thinking plays in creating fears, excessive worry, and ruminations. Improve effective communication skills to reduce relational stress Decrease irritability frequency and anger outbursts  Progress: Progressing  [name] Session, [facility]

## 2022-09-14 ENCOUNTER — Other Ambulatory Visit: Payer: Self-pay | Admitting: Adult Health

## 2022-09-14 ENCOUNTER — Other Ambulatory Visit (HOSPITAL_COMMUNITY): Payer: Self-pay

## 2022-09-14 DIAGNOSIS — F4323 Adjustment disorder with mixed anxiety and depressed mood: Secondary | ICD-10-CM

## 2022-09-14 MED ORDER — WEGOVY 1.7 MG/0.75ML ~~LOC~~ SOAJ
0.7500 mL | SUBCUTANEOUS | 3 refills | Status: DC
  Filled 2022-09-14: qty 3, 28d supply, fill #0
  Filled 2022-10-08: qty 3, 28d supply, fill #1

## 2022-09-29 ENCOUNTER — Ambulatory Visit: Payer: No Typology Code available for payment source | Admitting: Mental Health

## 2022-09-29 DIAGNOSIS — F411 Generalized anxiety disorder: Secondary | ICD-10-CM | POA: Diagnosis not present

## 2022-09-29 NOTE — Progress Notes (Signed)
[facility] Psychotherapy Note  Name: Christian [name] Date: 09/29/22 MRN: [id number] DOB: 10-Jul-1975 PCP: Cleatis Polka., MD  Time spent:  53 minutes  Treatment: Individual Therapy  Mental Status Exam:    Appearance:   Casual     Behavior:  Appropriate  Motor:  Normal  Speech/Language:   Clear and Coherent    Affect:  Full range  Mood:   Pleasant   Thought process:  normal  Thought content:    WNL  Sensory/Perceptual disturbances:    WNL  Orientation:  x4  Attention:  Good  Concentration:  Good  Memory:  intact  Fund of knowledge:   WNL  Insight:    good  Judgment:   Good  Impulse Control:  Good   Reported Symptoms:  Irritable, depressed, anxiety, anger outbursts (verbal), tearful, feelings of inadequacy  Risk Assessment:  Danger to Self:  No Self-injurious Behavior: No Danger to Others: No Duty to Warn:no Physical Aggression / Violence:No  Access to Firearms a concern: No  Gang Involvement:No  Patient / guardian was educated about steps to take if suicide or homicide risk level increases between visits: yes While future psychiatric events cannot be accurately predicted, the patient does not currently require acute inpatient psychiatric care and does not currently meet [facility] involuntary commitment criteria.  Subjective:  Patient presents for today's session.  Patient shared recent events and progress how he and his wife now have 2 foster children.  He went on to share details, how they were removed from their home in February and how they are the second foster home placement.  Patient stated that both he and his wife have strived to keep up with their needs as they have continued to adjust to living in their home which has been for about 3 weeks.  He shared how between keeping up with their needs, his own children and work he has been extremely busy.  He stated that he is kept up with work but has had to adjust and get creative with his schedule.  He  stated that his wife got upset about a week ago due to feeling stress as she was trying to adjust as she also is very busy with these changes.  He shared their communication, how he is trying to be supportive of her and due to her asking for more help from him, he is now following even more.  Explore ways to cope and care for himself with a focus on getting adequate rest as he stated he is often tired, trying to get a consistent sleep schedule.  He stated that his son is home from college but will be leaving next week and how he has been sleeping in his bedroom to ensure that he gets to bed early enough and not be to start; he stated he plans to move back in that room in about a week and feels his sleep might improve which will help reduce his stress and associated anxiety.  Intervention: supportive therapy, motivational interviewing    Diagnoses:    ICD-10-CM   1. Generalized anxiety disorder  F41.1           Plan: Patient is to use CBT, mindfulness and coping skills,  Patient to follow through with coping skills discussed. Patient to continue to be mindful of his communication and maintaining boundaries to keep stress more manageable.    Long-term goal:    Reduce overall level, frequency, and intensity of the feelings of depression,  anxiety and panic evidenced by decreased irritability, negative self talk  from 6 to 7 days/week to 0 to 1 days/week per client report for at least 3 consecutive months.    Short-term goal:  Verbally express understanding of the relationship between feelings of depression, anxiety and their impact on thinking patterns and behaviors. Verbalize an understanding of the role that distorted thinking plays in creating fears, excessive worry, and ruminations. Improve effective communication skills to reduce relational stress Decrease irritability frequency and anger outbursts  Progress: Progressing  [name] Session, [facility]

## 2022-10-11 ENCOUNTER — Ambulatory Visit (INDEPENDENT_AMBULATORY_CARE_PROVIDER_SITE_OTHER): Payer: No Typology Code available for payment source | Admitting: Mental Health

## 2022-10-11 ENCOUNTER — Other Ambulatory Visit (HOSPITAL_COMMUNITY): Payer: Self-pay

## 2022-10-11 DIAGNOSIS — F411 Generalized anxiety disorder: Secondary | ICD-10-CM

## 2022-10-11 NOTE — Progress Notes (Signed)
[facility] Psychotherapy Note  Name: Christian [name] Date: 10/11/22 MRN: [id number] DOB: 1975-07-25 PCP: Cleatis Polka., MD  Time spent:  54 minutes  Treatment: Individual Therapy  Mental Status Exam:    Appearance:   Casual     Behavior:  Appropriate  Motor:  Normal  Speech/Language:   Clear and Coherent    Affect:  Full range  Mood:   Pleasant   Thought process:  normal  Thought content:    WNL  Sensory/Perceptual disturbances:    WNL  Orientation:  x4  Attention:  Good  Concentration:  Good  Memory:  intact  Fund of knowledge:   WNL  Insight:    good  Judgment:   Good  Impulse Control:  Good   Reported Symptoms:  Irritable, depressed, anxiety, anger outbursts (verbal), tearful, feelings of inadequacy  Risk Assessment:  Danger to Self:  No Self-injurious Behavior: No Danger to Others: No Duty to Warn:no Physical Aggression / Violence:No  Access to Firearms a concern: No  Gang Involvement:No  Patient / guardian was educated about steps to take if suicide or homicide risk level increases between visits: yes While future psychiatric events cannot be accurately predicted, the patient does not currently require acute inpatient psychiatric care and does not currently meet [facility] involuntary commitment criteria.  Subjective:  Patient presents for today's session.  He shared recent events where he and his wife continue to have challenges with their daughter who is 25.  He stated that she continues to have defiant behaviors at home going on to share details, how she will make frequent messes in the house, eat excessive amounts of food in the kitchen leaving a mess often throughout the night.  He stated they have continued to consider group home placement; at this point, he feels it is the most reasonable option given her behaviors that have continued for years and the stress it has caused the family. They continue to foster 2 young children.  He stated  that he is often very busy with his daily schedule between work and meeting the needs of the children and other aspects of the household.  Facilitated his identifying names for himself, ways to focus on his own self-care, getting adequate rest more consistently and ensuring that he is focusing enough time on work which has been challenging recently.  Intervention: supportive therapy, motivational interviewing    Diagnoses:    ICD-10-CM   1. Generalized anxiety disorder  F41.1            Plan: Patient is to use CBT, mindfulness and coping skills,  Patient to follow through with coping skills discussed. Patient to continue to be mindful of his communication and maintaining boundaries to keep stress more manageable.    Long-term goal:    Reduce overall level, frequency, and intensity of the feelings of depression, anxiety and panic evidenced by decreased irritability, negative self talk  from 6 to 7 days/week to 0 to 1 days/week per client report for at least 3 consecutive months.    Short-term goal:  Verbally express understanding of the relationship between feelings of depression, anxiety and their impact on thinking patterns and behaviors. Verbalize an understanding of the role that distorted thinking plays in creating fears, excessive worry, and ruminations. Improve effective communication skills to reduce relational stress Decrease irritability frequency and anger outbursts  Progress: Progressing  [name] Session, [facility]

## 2022-10-13 ENCOUNTER — Other Ambulatory Visit: Payer: Self-pay

## 2022-10-14 ENCOUNTER — Other Ambulatory Visit: Payer: Self-pay

## 2022-10-19 ENCOUNTER — Other Ambulatory Visit (HOSPITAL_COMMUNITY): Payer: Self-pay

## 2022-10-19 MED ORDER — WEGOVY 2.4 MG/0.75ML ~~LOC~~ SOAJ
2.4000 mg | SUBCUTANEOUS | 2 refills | Status: DC
  Filled 2022-10-19: qty 3, 30d supply, fill #0

## 2022-10-20 ENCOUNTER — Other Ambulatory Visit (HOSPITAL_COMMUNITY): Payer: Self-pay

## 2022-10-21 ENCOUNTER — Other Ambulatory Visit (HOSPITAL_COMMUNITY): Payer: Self-pay

## 2022-10-21 MED ORDER — ZEPBOUND 7.5 MG/0.5ML ~~LOC~~ SOAJ: 7.5000 mg | SUBCUTANEOUS | 0 refills | Status: DC

## 2022-10-21 MED ORDER — ZEPBOUND 10 MG/0.5ML ~~LOC~~ SOAJ
10.0000 mg | SUBCUTANEOUS | 0 refills | Status: DC
  Filled 2022-11-11: qty 2, 28d supply, fill #0

## 2022-10-21 MED ORDER — ZEPBOUND 5 MG/0.5ML ~~LOC~~ SOAJ
5.0000 mg | SUBCUTANEOUS | 0 refills | Status: DC
  Filled 2022-10-21: qty 2, 28d supply, fill #0

## 2022-10-24 ENCOUNTER — Ambulatory Visit: Payer: No Typology Code available for payment source | Admitting: Mental Health

## 2022-10-24 DIAGNOSIS — F411 Generalized anxiety disorder: Secondary | ICD-10-CM | POA: Diagnosis not present

## 2022-10-24 NOTE — Progress Notes (Signed)
[facility] Psychotherapy Note  Name: Christian [name] Date: 10/24/22 MRN: [id number] DOB: 05-17-1975 PCP: Cleatis Polka., MD  Time spent:  52 minutes  Treatment: Individual Therapy  Mental Status Exam:    Appearance:   Casual     Behavior:  Appropriate  Motor:  Normal  Speech/Language:   Clear and Coherent    Affect:  Full range  Mood:   Pleasant   Thought process:  normal  Thought content:    WNL  Sensory/Perceptual disturbances:    WNL  Orientation:  x4  Attention:  Good  Concentration:  Good  Memory:  intact  Fund of knowledge:   WNL  Insight:    good  Judgment:   Good  Impulse Control:  Good   Reported Symptoms:  Irritable, depressed, anxiety, anger outbursts (verbal), tearful, feelings of inadequacy  Risk Assessment:  Danger to Self:  No Self-injurious Behavior: No Danger to Others: No Duty to Warn:no Physical Aggression / Violence:No  Access to Firearms a concern: No  Gang Involvement:No  Patient / guardian was educated about steps to take if suicide or homicide risk level increases between visits: yes While future psychiatric events cannot be accurately predicted, the patient does not currently require acute inpatient psychiatric care and does not currently meet [facility] involuntary commitment criteria.  Subjective:  Patient presents for today's session.  Assessed progress.  He stated that he has been under increased stress lately due to recent developments with the foster children but they have taken into their home.  He stated that they reported to their foster care agency that the 2 foster children are engaging in sexual behaviors with 1 another.  They stated they made this report immediately however it took the agency a full week to make the report to social services which was last Friday.  Currently, both he and his wife have been under increased stress due to trying to monitor the 2 children for safety.  He stated they have had motion  sensors with alarms installed to assist them in knowing where they are at especially at night.  Provide support as patient shared feelings related, being exhausted most days, this noticeable in session.  Explored ways to cope and care for himself, the need to prioritize his getting enough rest at night while he is also trying to work his full-time job simultaneously.   Intervention: supportive therapy, motivational interviewing    Diagnoses:    ICD-10-CM   1. Generalized anxiety disorder  F41.1             Plan: Patient is to use CBT, mindfulness and coping skills,  Patient to follow through with coping skills discussed. Patient to continue to be mindful of his communication and maintaining boundaries to keep stress more manageable.    Long-term goal:    Reduce overall level, frequency, and intensity of the feelings of depression, anxiety and panic evidenced by decreased irritability, negative self talk  from 6 to 7 days/week to 0 to 1 days/week per client report for at least 3 consecutive months.    Short-term goal:  Verbally express understanding of the relationship between feelings of depression, anxiety and their impact on thinking patterns and behaviors. Verbalize an understanding of the role that distorted thinking plays in creating fears, excessive worry, and ruminations. Improve effective communication skills to reduce relational stress Decrease irritability frequency and anger outbursts  Progress: Progressing  Christian [name] Session, [facility]

## 2022-10-27 ENCOUNTER — Other Ambulatory Visit (HOSPITAL_COMMUNITY): Payer: Self-pay

## 2022-11-11 ENCOUNTER — Other Ambulatory Visit (HOSPITAL_COMMUNITY): Payer: Self-pay

## 2022-11-14 ENCOUNTER — Other Ambulatory Visit (HOSPITAL_COMMUNITY): Payer: Self-pay

## 2022-11-14 MED ORDER — ZEPBOUND 12.5 MG/0.5ML ~~LOC~~ SOAJ
12.5000 mg | SUBCUTANEOUS | 3 refills | Status: DC
  Filled 2022-11-14: qty 2, 28d supply, fill #0
  Filled 2022-12-07: qty 2, 28d supply, fill #1

## 2022-11-22 ENCOUNTER — Ambulatory Visit: Payer: No Typology Code available for payment source | Admitting: Mental Health

## 2022-11-22 DIAGNOSIS — F411 Generalized anxiety disorder: Secondary | ICD-10-CM | POA: Diagnosis not present

## 2022-11-22 NOTE — Progress Notes (Signed)
[facility] Psychotherapy Note  Name: Christian [name] Date: 11/22/22 MRN: [id number] DOB: December 25, 1975 PCP: Cleatis Polka., MD  Time spent:  54 minutes  Treatment: Individual Therapy  Mental Status Exam:    Appearance:   Casual     Behavior:  Appropriate  Motor:  Normal  Speech/Language:   Clear and Coherent    Affect:  Full range  Mood:   Anxious, pleasant  Thought process:  normal  Thought content:    WNL  Sensory/Perceptual disturbances:    WNL  Orientation:  x4  Attention:  Good  Concentration:  Good  Memory:  intact  Fund of knowledge:   WNL  Insight:    good  Judgment:   Good  Impulse Control:  Good   Reported Symptoms:  Irritable, depressed, anxiety, anger outbursts (verbal), tearful, feelings of inadequacy  Risk Assessment:  Danger to Self:  No Self-injurious Behavior: No Danger to Others: No Duty to Warn:no Physical Aggression / Violence:No  Access to Firearms a concern: No  Gang Involvement:No  Patient / guardian was educated about steps to take if suicide or homicide risk level increases between visits: yes While future psychiatric events cannot be accurately predicted, the patient does not currently require acute inpatient psychiatric care and does not currently meet [facility] involuntary commitment criteria.  Subjective:  Patient presents for today's session.  Assessed progress since last visit which was about 1 month ago.  Patient shared how they continue to care for the 2 foster children in their home, recently having to make a report to DSS due to some information that was shared by one of the children regarding an incident that took place while in care with her biological mother and her boyfriend.  Patient shared the challenges of caring for the children while it has been difficult to go on trips as they were initially assured that this would be able to occur.  He stated that they later learned that the biological mother has to approve if  they go out of state or are gone from home more than 72 hour,; this was not made clear to them prior to their agreeing to being a foster family. He went on to share challenges in his marital relationship, his wife often needing his assistance in various capacities throughout the day while he also tries to manage working full-time.  He stated that she is quite often negative about various situations, some of which are her making judgmental or disparaging comments toward him.  He identified ways he has tried to set some boundaries in their relationship as a way to cope and care for himself while also trying to be supportive of family needs.  Intervention: supportive therapy, motivational interviewing    Diagnoses:  No diagnosis found.         Plan: Patient is to use CBT, mindfulness and coping skills,  Patient to follow through with coping skills discussed. Patient to continue to be mindful of his communication and maintaining boundaries to keep stress more manageable.    Long-term goal:    Reduce overall level, frequency, and intensity of the feelings of depression, anxiety and panic evidenced by decreased irritability, negative self talk  from 6 to 7 days/week to 0 to 1 days/week per client report for at least 3 consecutive months.    Short-term goal:  Verbally express understanding of the relationship between feelings of depression, anxiety and their impact on thinking patterns and behaviors. Verbalize an understanding of the role  that distorted thinking plays in creating fears, excessive worry, and ruminations. Improve effective communication skills to reduce relational stress Decrease irritability frequency and anger outbursts  Progress: Progressing  [name] Session, [facility]

## 2022-12-06 ENCOUNTER — Ambulatory Visit (INDEPENDENT_AMBULATORY_CARE_PROVIDER_SITE_OTHER): Payer: No Typology Code available for payment source | Admitting: Mental Health

## 2022-12-06 DIAGNOSIS — F411 Generalized anxiety disorder: Secondary | ICD-10-CM

## 2022-12-06 NOTE — Progress Notes (Signed)
[facility] Psychotherapy Note  Name: Christian [name] Date: 12/06/22 MRN: [id number] DOB: December 27, 1975 PCP: Cleatis Polka., MD  Time spent:  55 minutes  Treatment: Individual Therapy  Mental Status Exam:    Appearance:   Casual     Behavior:  Appropriate  Motor:  Normal  Speech/Language:   Clear and Coherent    Affect:  Full range  Mood:   Anxious, pleasant  Thought process:  normal  Thought content:    WNL  Sensory/Perceptual disturbances:    WNL  Orientation:  x4  Attention:  Good  Concentration:  Good  Memory:  intact  Fund of knowledge:   WNL  Insight:    good  Judgment:   Good  Impulse Control:  Good   Reported Symptoms:  Irritable, depressed, anxiety, anger outbursts (verbal), tearful, feelings of inadequacy  Risk Assessment:  Danger to Self:  No Self-injurious Behavior: No Danger to Others: No Duty to Warn:no Physical Aggression / Violence:No  Access to Firearms a concern: No  Gang Involvement:No  Patient / guardian was educated about steps to take if suicide or homicide risk level increases between visits: yes While future psychiatric events cannot be accurately predicted, the patient does not currently require acute inpatient psychiatric care and does not currently meet [facility] involuntary commitment criteria.  Subjective:  Patient presents for today's session.  Assessed progress per patient shared recent issues with family.  He stated that his continue to be challenging, they continue to care for 2 foster children.  He stated that he has been very busy supporting his son, taking him to his sports tournaments while also trying to manage the needs of the other children.  The challenges that his daughter presents makes it difficult as she has difficulty with completing hygiene, taking food excessively, not cleaning up after herself and other behavioral issues have been ongoing and added to his stress levels.  He stated that of the 2 foster  children, the boy has significant needs and more behavioral issues.  He stated that DSS is looking for potential alternative placement.  He stated that his wife has attached more to his sister and how they feel that she could thrive if continuing to live with them.  Ways to cope and care for himself, destress when possible given his hectic schedule, meeting the needs of others in the house by providing transportation, cooking and grocery shopping was explored.  Provide support and understanding throughout, facilitating his clarifying thoughts, feelings and needs.   Intervention: supportive therapy, motivational interviewing    Diagnoses:    ICD-10-CM   1. Generalized anxiety disorder  F41.1              Plan: Patient is to use CBT, mindfulness and coping skills,  Patient to follow through with coping skills discussed. Patient to continue to be mindful of his communication and maintaining boundaries to keep stress more manageable.    Long-term goal:    Reduce overall level, frequency, and intensity of the feelings of depression, anxiety and panic evidenced by decreased irritability, negative self talk  from 6 to 7 days/week to 0 to 1 days/week per client report for at least 3 consecutive months.    Short-term goal:  Verbally express understanding of the relationship between feelings of depression, anxiety and their impact on thinking patterns and behaviors. Verbalize an understanding of the role that distorted thinking plays in creating fears, excessive worry, and ruminations. Improve effective communication skills to reduce relational  stress Decrease irritability frequency and anger outbursts  Progress: Progressing  [name] Session, [facility]

## 2022-12-11 ENCOUNTER — Other Ambulatory Visit: Payer: Self-pay | Admitting: Adult Health

## 2022-12-11 DIAGNOSIS — F4323 Adjustment disorder with mixed anxiety and depressed mood: Secondary | ICD-10-CM

## 2022-12-22 ENCOUNTER — Ambulatory Visit: Payer: No Typology Code available for payment source | Admitting: Mental Health

## 2022-12-22 NOTE — Progress Notes (Signed)
No charge for missed session 12/22/22 (severe weather)

## 2022-12-27 ENCOUNTER — Other Ambulatory Visit (HOSPITAL_COMMUNITY): Payer: Self-pay

## 2022-12-27 MED ORDER — ZEPBOUND 15 MG/0.5ML ~~LOC~~ SOAJ
15.0000 mg | SUBCUTANEOUS | 11 refills | Status: DC
Start: 2022-12-27 — End: 2024-02-12
  Filled 2022-12-27: qty 2, 28d supply, fill #0
  Filled 2023-01-27: qty 2, 28d supply, fill #1
  Filled 2023-02-23: qty 2, 28d supply, fill #2
  Filled 2023-03-23: qty 2, 28d supply, fill #3
  Filled 2023-04-26: qty 2, 28d supply, fill #4
  Filled 2023-05-22: qty 2, 28d supply, fill #5
  Filled 2023-06-15 – 2023-06-29 (×2): qty 2, 28d supply, fill #6
  Filled 2023-07-20 – 2023-07-21 (×2): qty 2, 28d supply, fill #7
  Filled 2023-08-18 – 2023-09-14 (×6): qty 2, 28d supply, fill #8

## 2023-01-03 ENCOUNTER — Ambulatory Visit (INDEPENDENT_AMBULATORY_CARE_PROVIDER_SITE_OTHER): Payer: No Typology Code available for payment source | Admitting: Mental Health

## 2023-01-03 DIAGNOSIS — F411 Generalized anxiety disorder: Secondary | ICD-10-CM | POA: Diagnosis not present

## 2023-01-03 NOTE — Progress Notes (Signed)
[facility] Psychotherapy Note  Name: Christian [name] Date: 01/03/23 MRN: [id number] DOB: 1975/07/08 PCP: Cleatis Polka., MD  Time spent:  50 minutes  Treatment: Individual Therapy  Mental Status Exam:    Appearance:   Casual     Behavior:  Appropriate  Motor:  Normal  Speech/Language:   Clear and Coherent    Affect:  Full range  Mood:   Anxious, pleasant  Thought process:  normal  Thought content:    WNL  Sensory/Perceptual disturbances:    WNL  Orientation:  x4  Attention:  Good  Concentration:  Good  Memory:  intact  Fund of knowledge:   WNL  Insight:    good  Judgment:   Good  Impulse Control:  Good   Reported Symptoms:  Irritable, depressed, anxiety, anger outbursts (verbal), tearful, feelings of inadequacy  Risk Assessment:  Danger to Self:  No Self-injurious Behavior: No Danger to Others: No Duty to Warn:no Physical Aggression / Violence:No  Access to Firearms a concern: No  Gang Involvement:No  Patient / guardian was educated about steps to take if suicide or homicide risk level increases between visits: yes While future psychiatric events cannot be accurately predicted, the patient does not currently require acute inpatient psychiatric care and does not currently meet [facility] involuntary commitment criteria.  Subjective:  Patient presents for today's session.  Patient shared recent events in progress.  He stated that one of the foster children, the boy, was taken by DSS last Friday for an alternative out-of-home placement due to his behaviors in their home and how he is interacted with his sister.  Patient shared how he feels it was needed and going on to share his thoughts. He went on to share the challenges of trying to manage his daughter's needs and behaviors.  He stated that he has tried to limit access to the food pantry in the kitchen specifically due to his daughter eating at all hours of the night and/or whenever she has an  opportunity and they are not around.  He stated that she struggles with impulse control and seeks immediate gratification.  He stated this particularly can be stressful for his wife.  He is trying to manage his stress levels was explored.  He continues to work from home and overall would rather this with the situation as opposed to going back into work on site however, patient identified the need to have time to get work done and ways to accomplish this more consistently was explored collaboratively.  Intervention: supportive therapy, motivational interviewing    Diagnoses:    ICD-10-CM   1. Generalized anxiety disorder  F41.1        Plan: Patient is to use CBT, mindfulness and coping skills,  Patient to follow through with coping skills discussed. Patient to continue to be mindful of his communication and maintaining boundaries to keep stress more manageable.    Long-term goal:    Reduce overall level, frequency, and intensity of the feelings of depression, anxiety and panic evidenced by decreased irritability, negative self talk  from 6 to 7 days/week to 0 to 1 days/week per client report for at least 3 consecutive months.    Short-term goal:  Verbally express understanding of the relationship between feelings of depression, anxiety and their impact on thinking patterns and behaviors. Verbalize an understanding of the role that distorted thinking plays in creating fears, excessive worry, and ruminations. Improve effective communication skills to reduce relational stress Decrease irritability frequency  and anger outbursts  Progress: Progressing  [name] Session, [facility]

## 2023-01-11 ENCOUNTER — Other Ambulatory Visit (HOSPITAL_COMMUNITY): Payer: Self-pay

## 2023-01-12 ENCOUNTER — Other Ambulatory Visit (HOSPITAL_COMMUNITY): Payer: Self-pay

## 2023-01-13 ENCOUNTER — Other Ambulatory Visit (HOSPITAL_COMMUNITY): Payer: Self-pay

## 2023-01-17 ENCOUNTER — Ambulatory Visit: Payer: No Typology Code available for payment source | Admitting: Mental Health

## 2023-01-17 DIAGNOSIS — F411 Generalized anxiety disorder: Secondary | ICD-10-CM | POA: Diagnosis not present

## 2023-01-17 NOTE — Progress Notes (Signed)
[facility] Psychotherapy Note  Name: Christian [name] Date: 01/17/23 MRN: [id number] DOB: 1976/03/19 PCP: Cleatis Polka., MD  Time spent:  51 minutes  Treatment: Individual Therapy  Mental Status Exam:    Appearance:   Casual     Behavior:  Appropriate  Motor:  Normal  Speech/Language:   Clear and Coherent    Affect:  Full range  Mood:   Anxious, pleasant  Thought process:  normal  Thought content:    WNL  Sensory/Perceptual disturbances:    WNL  Orientation:  x4  Attention:  Good  Concentration:  Good  Memory:  intact  Fund of knowledge:   WNL  Insight:    good  Judgment:   Good  Impulse Control:  Good   Reported Symptoms:  Irritable, depressed, anxiety, anger outbursts (verbal), tearful, feelings of inadequacy  Risk Assessment:  Danger to Self:  No Self-injurious Behavior: No Danger to Others: No Duty to Warn:no Physical Aggression / Violence:No  Access to Firearms a concern: No  Gang Involvement:No  Patient / guardian was educated about steps to take if suicide or homicide risk level increases between visits: yes While future psychiatric events cannot be accurately predicted, the patient does not currently require acute inpatient psychiatric care and does not currently meet [facility] involuntary commitment criteria.  Subjective:  Patient presents for today's session.  Assessed progress where patient shared challenges primarily related to family issues.  He stated that his son had a football game on the weekend recently, this conflicting with their family vacation for a week and however, patient stated he cannot work her way to solve the issue where his son could stay with a coach and then be able to go to the game.  He stated this was not acceptable to his wife.  Patient shared how this and other examples reinforce his feeling "nothing I do is good enough" in reference to his wife.  He stated tha he stayed home with his son over the weekend, cleaned  the entire house, cooked but when she comes home she does not appreciate any of his efforts, rather she will complain about something.  He shared an instance where he lost his temper verbally with his daughter, recognizing I need to work on managing his anger.  We explored ways to cope, facilitating how he feels he could have handled it more effectively.  Identified the need also to have an outlet for himself as a way to cope and care for himself.    Intervention: supportive therapy, motivational interviewing    Diagnoses:    ICD-10-CM   1. Generalized anxiety disorder  F41.1         Plan: Patient is to use CBT, mindfulness and coping skills,  Patient to follow through with coping skills discussed. Patient to continue to be mindful of his communication and maintaining boundaries to keep stress more manageable.    Long-term goal:    Reduce overall level, frequency, and intensity of the feelings of depression, anxiety and panic evidenced by decreased irritability, negative self talk  from 6 to 7 days/week to 0 to 1 days/week per client report for at least 3 consecutive months.    Short-term goal:  Verbally express understanding of the relationship between feelings of depression, anxiety and their impact on thinking patterns and behaviors. Verbalize an understanding of the role that distorted thinking plays in creating fears, excessive worry, and ruminations. Improve effective communication skills to reduce relational stress Decrease irritability frequency  and anger outbursts  Progress: Progressing  [name] Session, [facility]

## 2023-01-27 ENCOUNTER — Other Ambulatory Visit (HOSPITAL_COMMUNITY): Payer: Self-pay

## 2023-01-31 ENCOUNTER — Ambulatory Visit: Payer: No Typology Code available for payment source | Admitting: Mental Health

## 2023-01-31 DIAGNOSIS — F411 Generalized anxiety disorder: Secondary | ICD-10-CM | POA: Diagnosis not present

## 2023-01-31 NOTE — Progress Notes (Signed)
[facility] Note  Name: Christian [name] Date: 01/31/23 MRN: [id number] DOB: [birth year] PCP: Christian Polka., MD  Time spent:  55 minutes  Treatment: Individual Therapy  Mental Status Exam:    Appearance:   Casual     Behavior:  Appropriate  Motor:  Normal  Speech/Language:   Clear and Coherent    Affect:  Full range  Mood:   Anxious, pleasant  Thought process:  normal  Thought content:    WNL  Sensory/Perceptual disturbances:    WNL  Orientation:  x4  Attention:  Good  Concentration:  Good  Memory:  intact  Fund of knowledge:   WNL  Insight:    good  Judgment:   Good  Impulse Control:  Good   Reported Symptoms:  Irritable, depressed, anxiety, anger outbursts (verbal), tearful, feelings of inadequacy  Risk Assessment:  Danger to Self:  No Self-injurious Behavior: No Danger to Others: No Duty to Warn:no Physical Aggression / Violence:No  Access to Firearms a concern: No  Gang Involvement:No  Patient / guardian was educated about steps to take if suicide or homicide risk level increases between visits: yes While future psychiatric events cannot be accurately predicted, the patient does not currently require acute inpatient psychiatric care and does not currently meet [facility] involuntary commitment criteria.  Subjective:  Patient presents for today's session.  Patient shared recent events, how he continues to identify stressors primarily related to family.  Details related to how they continue to try and care for their daughter effectively shared, the stress and toll it takes on their relationship was also further detailed.  He identified feeling that he is the one who is made responsible by his wife to address or fix situations.  He stated that most recent example is related to how his wife wanted her to not be enrolled at her school, to stay at home and how this has added considerable stress to their daily life.  Patient reviewed how he  expressed his concern about this plan, and how in previous discussions with his wife, not feeling heard.  He stated that his wife can become easily overwhelmed while he also recognizes when he gets frustrated he does not communicate effectively which can create more strain in their relationship.  Explored collaboratively ways to communicate while also recognizing other aspects of his own self-care, trying to focus on getting adequate rest, maintaining boundaries at home as he works from home to get work related tasks completed.  He shared also further history related to how he did not feel validated furcation such as birthdays growing up, his parents often not celebrating them for him and how this is made him feel, unworthy.    Intervention: supportive therapy, motivational interviewing    Diagnoses:    ICD-10-CM   1. Generalized anxiety disorder  F41.1          Plan: Patient is to use CBT, mindfulness and coping skills,  Patient to follow through with coping skills discussed. Patient to continue to be mindful of his communication and maintaining boundaries to keep stress more manageable.    Long-term goal:    Reduce overall level, frequency, and intensity of the feelings of depression, anxiety and panic evidenced by decreased irritability, negative self talk  from 6 to 7 days/week to 0 to 1 days/week per client report for at least 3 consecutive months.    Short-term goal:  Verbally express understanding of the relationship between feelings of depression, anxiety and  their impact on thinking patterns and behaviors. Verbalize an understanding of the role that distorted thinking plays in creating fears, excessive worry, and ruminations. Improve effective communication skills to reduce relational stress Decrease irritability frequency and anger outbursts  Progress: Progressing  [name] Session, [facility]

## 2023-02-14 ENCOUNTER — Ambulatory Visit: Payer: No Typology Code available for payment source | Admitting: Mental Health

## 2023-02-22 ENCOUNTER — Other Ambulatory Visit: Payer: Self-pay

## 2023-02-22 ENCOUNTER — Telehealth (INDEPENDENT_AMBULATORY_CARE_PROVIDER_SITE_OTHER): Payer: No Typology Code available for payment source | Admitting: Adult Health

## 2023-02-22 DIAGNOSIS — F4323 Adjustment disorder with mixed anxiety and depressed mood: Secondary | ICD-10-CM | POA: Diagnosis not present

## 2023-02-22 MED ORDER — FLUOXETINE HCL 20 MG PO CAPS: 20.0000 mg | ORAL_CAPSULE | Freq: Every day | ORAL | 0 refills | Status: DC

## 2023-02-22 MED ORDER — FLUOXETINE HCL 40 MG PO CAPS: 40.0000 mg | ORAL_CAPSULE | Freq: Every day | ORAL | 1 refills | Status: DC

## 2023-02-22 MED ORDER — HYDROXYZINE PAMOATE 100 MG PO CAPS: 100.0000 mg | ORAL_CAPSULE | Freq: Every evening | ORAL | 0 refills | Status: DC

## 2023-02-22 NOTE — Telephone Encounter (Signed)
See office visit note for medication changes.

## 2023-02-22 NOTE — Telephone Encounter (Signed)
Patient was seen today. Medications were modified per Regina's request

## 2023-02-27 ENCOUNTER — Encounter: Payer: Self-pay | Admitting: Adult Health

## 2023-02-27 NOTE — Progress Notes (Addendum)
Christian [name] [id number] 1976-01-10 47 y.o.  Virtual Visit via Video Note  I connected with pt @ on 02/22/23 at  9:00 AM EDT by a video enabled telemedicine application and verified that I am speaking with the correct person using two identifiers.   I discussed the limitations of evaluation and management by telemedicine and the availability of in person appointments. The patient expressed understanding and agreed to proceed.  I discussed the assessment and treatment plan with the patient. The patient was provided an opportunity to ask questions and all were answered. The patient agreed with the plan and demonstrated an understanding of the instructions.   The patient was advised to call back or seek an in-person evaluation if the symptoms worsen or if the condition fails to improve as anticipated.  I provided 10 minutes of non-face-to-face time during this encounter.  The patient was located at home.  The provider was located at [facility].   Dorothyann Gibbs, NP   Subjective:   Patient ID:  Christian [name] is a 47 y.o. (DOB [birth year]) male.  Chief Complaint: No chief complaint on file.   HPI Christian [name] presents for follow-up of adjustment disorder.  Describes mood today as "ok". Pleasant. Denies tearfulness. Mood symptoms - reports depression, anxiety, and irritability - related to situational stressors. Reports a few panic attacks. Denies worry, rumination and over thinking. Mood is stable. Stating "I don't feel terrible, but I don't like the way I feel". Feels like current medications are helpful but may need to be increased. Varying interest and motivation. Taking medications as prescribed.  Energy levels stable - "good". Active, walking daily with daughter.  Enjoys some usual interests and activities. Married. Lives with wife and children. Appetite adequate. Weight loss - 17 pounds Sleeping well most nights. Averages 8 hours. Focus and concentration  difficulties. Completing tasks. Managing aspects of household. Working full time. Denies SI or HI. Denies AH or VH. Denies self-harm.  Denies substance use.  Previous medication trials: Qysmia, Lexapro  Review of Systems:  Review of Systems  Musculoskeletal:  Negative for gait problem.  Neurological:  Negative for tremors.  Psychiatric/Behavioral:         Please refer to HPI    Medications: I have reviewed the patient's current medications.  Current Outpatient Medications  Medication Sig Dispense Refill   baclofen (LIORESAL) 10 MG tablet Take 10 mg by mouth 3 (three) times daily as needed. Patient takes once daily at bedtime prn.     FLUoxetine (PROZAC) 20 MG capsule Take 1 capsule (20 mg total) by mouth daily. 90 capsule 0   FLUoxetine (PROZAC) 40 MG capsule Take 1 capsule (40 mg total) by mouth daily. 90 capsule 1   Fremanezumab-vfrm (AJOVY) 225 MG/1.5ML SOSY Inject 225 mg into the skin every 30 (thirty) days. 1.5 mL 11   hydrOXYzine (VISTARIL) 100 MG capsule Take 1 capsule (100 mg total) by mouth at bedtime. 90 capsule 0   lisinopril-hydrochlorothiazide (ZESTORETIC) 10-12.5 MG tablet Take 1 tablet by mouth at bedtime.     oxybutynin (DITROPAN) 5 MG tablet Take 1 tablet (5 mg total) by mouth every 8 (eight) hours as needed for bladder spasms. 15 tablet 1   rizatriptan (MAXALT) 10 MG tablet Take 10 mg by mouth as needed.     rosuvastatin (CRESTOR) 10 MG tablet Take 10 mg by mouth at bedtime.     Semaglutide-Weight Management (WEGOVY) 1.7 MG/0.75ML SOAJ Inject 1.7 mg into the skin once a week. 3  mL 3   Semaglutide-Weight Management (WEGOVY) 2.4 MG/0.75ML SOAJ Inject 2.4 mg into the skin once a week as directed 3 mL 2   Semaglutide-Weight Management 1 MG/0.5ML SOAJ Inject 1 mg into the skin once a week. 2 mL 11   Semaglutide-Weight Management 1.7 MG/0.75ML SOAJ Inject 1.7 mg into the skin once a week. 3 mL 0   tirzepatide (ZEPBOUND) 10 MG/0.5ML Pen Inject 10 mg into the skin once a  week. 2 mL 0   tirzepatide (ZEPBOUND) 12.5 MG/0.5ML Pen Inject 12.5 mg into the skin once a week. 2 mL 3   tirzepatide (ZEPBOUND) 15 MG/0.5ML Pen Inject 15 mg into the skin once a week. 2 mL 11   tirzepatide (ZEPBOUND) 5 MG/0.5ML Pen Inject 5 mg into the skin once a week. 2 mL 0   tirzepatide (ZEPBOUND) 7.5 MG/0.5ML Pen Inject 7.5 mg into the skin once a week. 2 mL 0   TROKENDI XR 200 MG CP24 Take 1 capsule by mouth at bedtime.     TROKENDI XR 200 MG CP24 Take 1 capsule (200 mg) by mouth daily. 30 capsule 5   valsartan-hydrochlorothiazide (DIOVAN-HCT) 160-25 MG tablet Take 1 tablet by mouth daily. 90 tablet 3   No current facility-administered medications for this visit.    Medication Side Effects: None  Allergies:  Allergies  Allergen Reactions   Lisinopril Cough    Past Medical History:  Diagnosis Date   Abdominal mass 2022   Pt had an abdominal mass abcess treated with antibiotics. Mass reoccurred. Surgery was done to remove abcess on 04/06/21 by Dr. Karie Soda.   Depression    Only during Covid. Pt is better and off all medication as of 04/23/2019.   Family history of adverse reaction to anesthesia    Pt stated that his mother has had severe N & V after anesthesia. He has never had a problem per pt.   Hypertension    Meningitis    spinal meningitis 1980   Metabolic syndrome    Migraine headache    hx of chronic migraines   Obesity    Other intervertebral disc degeneration, thoracic region    Patient has back pain due to an old injury. He received an epidural injection in his back on 04/22/21.   Prediabetes    last blood drawn in 01/2021 showed prediabetes per pt   Sleep apnea    Patient was diagnosed with sleep apnea from a 02/04/2019 home study. He was unable to tolerate a CPAP.   Snoring    Wears glasses     Family History  Problem Relation Age of Onset   Fibromyalgia Mother    Neurodegenerative disease Mother    CVA Father    CAD Father    Diabetes Father     Hypertension Father    Obesity Father    Liver cancer Maternal Grandfather    Heart attack Maternal Grandfather    Lung disease Paternal Grandmother    Heart attack Paternal Grandfather    Diabetes Paternal Grandfather     Social History   Socioeconomic History   Marital status: Married    Spouse name: Not on file   Number of children: Not on file   Years of education: Not on file   Highest education level: Not on file  Occupational History   Not on file  Tobacco Use   Smoking status: Never   Smokeless tobacco: Never  Vaping Use   Vaping status: Never Used  Substance and Sexual  Activity   Alcohol use: Yes    Comment: Occassional Beer    Drug use: No   Sexual activity: Not on file  Other Topics Concern   Not on file  Social History Narrative   Not on file   Social Determinants of Health   Financial Resource Strain: Low Risk  (06/23/2022)   Received from [facility], [facility]   Overall Financial Resource Strain (CARDIA)    Difficulty of Paying Living Expenses: Not hard at all  Food Insecurity: No Food Insecurity (06/23/2022)   Received from [facility], [facility]   Hunger Vital Sign    Worried About Running Out of Food in the Last Year: Never true    Ran Out of Food in the Last Year: Never true  Transportation Needs: No Transportation Needs (06/23/2022)   Received from [facility], [facility]   PRAPARE - Transportation    Lack of Transportation (Medical): No    Lack of Transportation (Non-Medical): No  Physical Activity: Unknown (06/23/2022)   Received from [facility]   Exercise Vital Sign    Days of Exercise per Week: 0 days    Minutes of Exercise per Session: Not on file  Recent Concern: Physical Activity - Inactive (06/23/2022)   Received from [facility]   Exercise Vital Sign    Days of Exercise per Week: 0 days    Minutes of Exercise per Session: 0 min  Stress: No Stress Concern Present (06/23/2022)   Received from [facility], [facility] - Occupational Stress Questionnaire    Feeling of Stress : Only a little  Social Connections: Socially Isolated (06/23/2022)   Received from [facility], [facility]   Social Network    How would you rate your social network (family, work, friends)?: Little participation, lonely and socially isolated  Intimate Partner Violence: Not At Risk (06/23/2022)   Received from [facility], [facility]   HITS    Over the last 12 months how often did your partner physically hurt you?: 1    Over the last 12 months how often did your partner insult you or talk down to you?: 1    Over the last 12 months how often did your partner threaten you with physical harm?: 1    Over the last 12 months how often did your partner scream or curse at you?: 2    Past Medical History, Surgical history, Social history, and Family history were reviewed and updated as appropriate.   Please see review of systems for further details on the patient's review from today.   Objective:   Physical Exam:  There were no vitals taken for this visit.  Physical Exam Constitutional:      General: He is not in acute distress. Musculoskeletal:        General: No deformity.  Neurological:     Mental Status: He is alert and oriented to person, place, and time.     Coordination: Coordination normal.  Psychiatric:        Attention and Perception: Attention and perception normal. He does not perceive auditory or visual hallucinations.        Mood and Affect: Affect is not labile, blunt, angry or inappropriate.        Speech: Speech normal.        Behavior: Behavior normal.        Thought Content: Thought content normal. Thought content is not paranoid or delusional. Thought content  does not include homicidal or suicidal ideation. Thought content does not include homicidal or suicidal plan.        Cognition and Memory: Cognition and memory normal.        Judgment: Judgment  normal.     Comments: Insight intact     Lab Review:     Component Value Date/Time   NA 139 04/23/2021 1128   K 3.1 (L) 04/23/2021 1128   CL 106 04/23/2021 1128   CO2 25 04/19/2014 1152   GLUCOSE 118 (H) 04/23/2021 1128   BUN 23 (H) 04/23/2021 1128   CREATININE 0.80 04/23/2021 1128   CALCIUM 9.4 04/19/2014 1152   PROT 8.0 04/19/2014 1152   ALBUMIN 4.1 04/19/2014 1152   AST 18 04/19/2014 1152   ALT 23 04/19/2014 1152   ALKPHOS 92 04/19/2014 1152   BILITOT 0.3 04/19/2014 1152   GFRNONAA >90 04/19/2014 1152   GFRAA >90 04/19/2014 1152       Component Value Date/Time   WBC 7.0 04/19/2014 1152   RBC 5.38 04/19/2014 1152   HGB 16.3 04/23/2021 1128   HGB 14.8 11/25/2008 1520   HCT 48.0 04/23/2021 1128   HCT 43.3 11/25/2008 1520   PLT 152 04/19/2014 1152   PLT 196 11/25/2008 1520   MCV 87.2 04/19/2014 1152   MCV 87.8 11/25/2008 1520   MCH 29.4 04/19/2014 1152   MCHC 33.7 04/19/2014 1152   RDW 13.0 04/19/2014 1152   RDW 13.8 11/25/2008 1520   LYMPHSABS 1.4 04/19/2014 1152   LYMPHSABS 2.0 11/25/2008 1520   MONOABS 1.0 04/19/2014 1152   MONOABS 0.7 11/25/2008 1520   EOSABS 0.1 04/19/2014 1152   EOSABS 0.1 11/25/2008 1520   BASOSABS 0.0 04/19/2014 1152   BASOSABS 0.1 11/25/2008 1520    No results found for: "POCLITH", "LITHIUM"   No results found for: "PHENYTOIN", "PHENOBARB", "VALPROATE", "CBMZ"   .res Assessment: Plan:    Plan:  PDMP reviewed  Increase Prozac 40 to 60mg  daily.  Increase Hydroxyzine 75 to 100mg  at hs  RTC 3 months  Patient advised to contact office with any questions, adverse effects, or acute worsening in signs and symptoms.  Time spent with patient was 15 minutes. Greater than 50% of face to face time with patient was spent on counseling and coordination of care.    Discussed potential benefits, risk, and side effects of benzodiazepines to include potential risk of tolerance and dependence, as well as possible drowsiness. Advised patient  not to drive if experiencing drowsiness and to take lowest possible effective dose to minimize risk of dependence and tolerance.  Diagnoses and all orders for this visit:  Adjustment disorder with mixed anxiety and depressed mood     Please see After Visit Summary for patient specific instructions.  Future Appointments  Date Time Provider Department Center  03/01/2023 11:00 AM [name] Session, [facility] None  03/16/2023  9:00 AM [name] Session, [facility] None    No orders of the defined types were placed in this encounter.     -------------------------------

## 2023-03-01 ENCOUNTER — Ambulatory Visit (INDEPENDENT_AMBULATORY_CARE_PROVIDER_SITE_OTHER): Payer: No Typology Code available for payment source | Admitting: Mental Health

## 2023-03-01 DIAGNOSIS — F411 Generalized anxiety disorder: Secondary | ICD-10-CM

## 2023-03-01 NOTE — Progress Notes (Unsigned)
[facility] Psych[facility] Note  Name: Christian [name] Date: 01/31/23 MRN: [id number] DOB: [birth year] PCP: Cleatis Polka., MD  Time spent:  55 minutes  Treatment: Individual Therapy  Mental Status Exam:    Appearance:   Casual     Behavior:  Appropriate  Motor:  Normal  Speech/Language:   Clear and Coherent    Affect:  Full range  Mood:   Anxious, pleasant  Thought process:  normal  Thought content:    WNL  Sensory/Perceptual disturbances:    WNL  Orientation:  x4  Attention:  Good  Concentration:  Good  Memory:  intact  Fund of knowledge:   WNL  Insight:    good  Judgment:   Good  Impulse Control:  Good   Reported Symptoms:  Irritable, depressed, anxiety, anger outbursts (verbal), tearful, feelings of inadequacy  Risk Assessment:  Danger to Self:  No Self-injurious Behavior: No Danger to Others: No Duty to Warn:no Physical Aggression / Violence:No  Access to Firearms a concern: No  Gang Involvement:No  Patient / guardian was educated about steps to take if suicide or homicide risk level increases between visits: yes While future psychiatric events cannot be accurately predicted, the patient does not currently require acute inpatient psychiatric care and does not currently meet [facility] involuntary commitment criteria.  Subjective:  Patient presents for today's session.    Patient shared recent events, how he continues to identify stressors primarily related to family.  Details related to how they continue to try and care for their daughter effectively shared, the stress and toll it takes on their relationship was also further detailed.  He identified feeling that he is the one who is made responsible by his wife to address or fix situations.  He stated that most recent example is related to how his wife wanted her to not be enrolled at her school, to stay at home and how this has added considerable stress to their daily life.  Patient reviewed how he  expressed his concern about this plan, and how in previous discussions with his wife, not feeling heard.  He stated that his wife can become easily overwhelmed while he also recognizes when he gets frustrated he does not communicate effectively which can create more strain in their relationship.  Explored collaboratively ways to communicate while also recognizing other aspects of his own self-care, trying to focus on getting adequate rest, maintaining boundaries at home as he works from home to get work related tasks completed.  He shared also further history related to how he did not feel validated furcation such as birthdays growing up, his parents often not celebrating them for him and how this is made him feel, unworthy.    Intervention: supportive therapy, motivational interviewing    Diagnoses:  No diagnosis found.      Plan: Patient is to use CBT, mindfulness and coping skills,  Patient to follow through with coping skills discussed. Patient to continue to be mindful of his communication and maintaining boundaries to keep stress more manageable.    Long-term goal:    Reduce overall level, frequency, and intensity of the feelings of depression, anxiety and panic evidenced by decreased irritability, negative self talk  from 6 to 7 days/week to 0 to 1 days/week per client report for at least 3 consecutive months.    Short-term goal:  Verbally express understanding of the relationship between feelings of depression, anxiety and their impact on thinking patterns and behaviors. Verbalize an understanding  of the role that distorted thinking plays in creating fears, excessive worry, and ruminations. Improve effective communication skills to reduce relational stress Decrease irritability frequency and anger outbursts  Progress: Progressing  [name] Session, [facility]

## 2023-03-06 ENCOUNTER — Other Ambulatory Visit: Payer: Self-pay | Admitting: Adult Health

## 2023-03-06 DIAGNOSIS — F4323 Adjustment disorder with mixed anxiety and depressed mood: Secondary | ICD-10-CM

## 2023-03-16 ENCOUNTER — Ambulatory Visit (INDEPENDENT_AMBULATORY_CARE_PROVIDER_SITE_OTHER): Payer: No Typology Code available for payment source | Admitting: Mental Health

## 2023-03-16 DIAGNOSIS — F411 Generalized anxiety disorder: Secondary | ICD-10-CM

## 2023-03-16 NOTE — Progress Notes (Signed)
[facility] Psychotherapy Note  Name: Christian [name] Date: 03/16/23 MRN: [id number] DOB: Mar 08, 1976 PCP: Christian [name]., MD  Time spent:  51 minutes  Treatment: Individual Therapy  Mental Status Exam:    Appearance:   Casual     Behavior:  Appropriate  Motor:  Normal  Speech/Language:   Clear and Coherent    Affect:  Full range  Mood:   Anxious, pleasant  Thought process:  normal  Thought content:    WNL  Sensory/Perceptual disturbances:    WNL  Orientation:  x4  Attention:  Good  Concentration:  Good  Memory:  intact  Fund of knowledge:   WNL  Insight:    good  Judgment:   Good  Impulse Control:  Good   Reported Symptoms:  Irritable, depressed, anxiety, anger outbursts (verbal), tearful, feelings of inadequacy  Risk Assessment:  Danger to Self:  No Self-injurious Behavior: No Danger to Others: No Duty to Warn:no Physical Aggression / Violence:No  Access to Firearms a concern: No  Gang Involvement:No  Patient / guardian was educated about steps to take if suicide or homicide risk level increases between visits: yes While future psychiatric events cannot be accurately predicted, the patient does not currently require acute inpatient psychiatric care and does not currently meet [facility] involuntary commitment criteria.  Subjective:  Patient presents for today's session.  Assessed progress.  Patient shared feeling "burned out".  He went on to share recent events related to his daughter, her continuing to have challenging behaviors day-to-day that can bring stress to the household.  He stated that he tries to be supportive of his wife who teaches her most days and then on Friday his daughter goes to a co-op group.  Facilitated his identifying needs, he stated that he often secludes himself in a bedroom in the house that he can work out of and also sleeps in this room.  He stated that he will stay in his room at times more excessively just to give himself  moments of reprieve.  Assessed his marital relationship where he stated they have been doing "better", less arguments most of their stresses centered around coping with their daughter's needs.  He identified further enjoying spending time with his sons, his youngest son who plays football.  Ways to continue to cope and care for himself, getting adequate rest as he stated that he is often tired and at some point possibly taking a vacation with his wife as he stated this been a few years.     Intervention: supportive therapy, motivational interviewing, problem solving   Diagnoses:    ICD-10-CM   1. Generalized anxiety disorder  F41.1         Plan: Patient is to use CBT, mindfulness and coping skills,  Patient to follow through with coping skills discussed. Patient to continue to be mindful of his communication and maintaining boundaries to keep stress more manageable.    Long-term goal:    Reduce overall level, frequency, and intensity of the feelings of depression, anxiety and panic evidenced by decreased irritability, negative self talk  from 6 to 7 days/week to 0 to 1 days/week per client report for at least 3 consecutive months.    Short-term goal:  Verbally express understanding of the relationship between feelings of depression, anxiety and their impact on thinking patterns and behaviors. Verbalize an understanding of the role that distorted thinking plays in creating fears, excessive worry, and ruminations. Improve effective communication skills to reduce relational  stress Decrease irritability frequency and anger outbursts  Progress: Progressing  [name] Session, [facility]

## 2023-03-23 ENCOUNTER — Other Ambulatory Visit: Payer: Self-pay

## 2023-03-23 ENCOUNTER — Other Ambulatory Visit (HOSPITAL_COMMUNITY): Payer: Self-pay

## 2023-03-24 ENCOUNTER — Other Ambulatory Visit (HOSPITAL_COMMUNITY): Payer: Self-pay

## 2023-03-29 ENCOUNTER — Ambulatory Visit: Payer: No Typology Code available for payment source | Admitting: Mental Health

## 2023-04-03 ENCOUNTER — Telehealth: Payer: Self-pay | Admitting: Adult Health

## 2023-04-03 DIAGNOSIS — F4323 Adjustment disorder with mixed anxiety and depressed mood: Secondary | ICD-10-CM

## 2023-04-03 NOTE — Telephone Encounter (Signed)
Christian [name] called and LM at 2:23 to request refill of his fluoxetine 60mg .  This is a dose change. Send to [facility] outpatient pharmacy

## 2023-04-03 NOTE — Telephone Encounter (Signed)
Has RF at [facility].

## 2023-04-04 ENCOUNTER — Other Ambulatory Visit (HOSPITAL_COMMUNITY): Payer: Self-pay

## 2023-04-04 MED ORDER — FLUOXETINE HCL 40 MG PO CAPS
40.0000 mg | ORAL_CAPSULE | Freq: Every day | ORAL | 0 refills | Status: DC
  Filled 2023-04-04 – 2023-06-15 (×7): qty 90, 90d supply, fill #0

## 2023-04-04 MED ORDER — FLUOXETINE HCL 20 MG PO CAPS
20.0000 mg | ORAL_CAPSULE | Freq: Every day | ORAL | 0 refills | Status: DC
  Filled 2023-04-04 – 2023-04-26 (×2): qty 90, 90d supply, fill #0
  Filled 2023-04-28: qty 30, 30d supply, fill #0
  Filled 2023-05-22: qty 30, 30d supply, fill #1
  Filled 2023-06-06 – 2023-06-15 (×3): qty 30, 30d supply, fill #2

## 2023-04-04 NOTE — Telephone Encounter (Signed)
Rx for 40 and 20 fluoxetine sent to [facility].

## 2023-04-11 ENCOUNTER — Ambulatory Visit: Payer: No Typology Code available for payment source | Admitting: Mental Health

## 2023-04-25 ENCOUNTER — Ambulatory Visit: Payer: No Typology Code available for payment source | Admitting: Mental Health

## 2023-04-25 NOTE — Progress Notes (Signed)
No chg for 04/25/23 missed session.

## 2023-04-26 ENCOUNTER — Other Ambulatory Visit (HOSPITAL_COMMUNITY): Payer: Self-pay

## 2023-04-26 ENCOUNTER — Other Ambulatory Visit: Payer: Self-pay

## 2023-04-26 MED ORDER — VALSARTAN-HYDROCHLOROTHIAZIDE 160-25 MG PO TABS
1.0000 | ORAL_TABLET | Freq: Every day | ORAL | 3 refills | Status: AC
  Filled 2023-05-22 – 2023-07-13 (×3): qty 90, 90d supply, fill #0
  Filled 2023-10-12: qty 90, 90d supply, fill #1

## 2023-04-28 ENCOUNTER — Other Ambulatory Visit (HOSPITAL_COMMUNITY): Payer: Self-pay

## 2023-05-05 ENCOUNTER — Other Ambulatory Visit: Payer: Self-pay | Admitting: Adult Health

## 2023-05-08 ENCOUNTER — Ambulatory Visit (INDEPENDENT_AMBULATORY_CARE_PROVIDER_SITE_OTHER): Payer: No Typology Code available for payment source | Admitting: Mental Health

## 2023-05-08 DIAGNOSIS — F4323 Adjustment disorder with mixed anxiety and depressed mood: Secondary | ICD-10-CM

## 2023-05-08 NOTE — Progress Notes (Unsigned)
[facility] Psychotherapy Note  Name: Christian [name] Date: 05/08/23 MRN: [id number] DOB: 1976-04-19 PCP: Christian Polka., MD  Time spent:  50 minutes  Treatment: Individual Therapy  Mental Status Exam:    Appearance:   Casual     Behavior:  Appropriate  Motor:  Normal  Speech/Language:   Clear and Coherent    Affect:  Full range  Mood:    Euthymic  Thought process:  normal  Thought content:    WNL  Sensory/Perceptual disturbances:    WNL  Orientation:  x4  Attention:  Good  Concentration:  Good  Memory:  intact  Fund of knowledge:   WNL  Insight:    good  Judgment:   Good  Impulse Control:  Good   Reported Symptoms:  Irritable, depressed, anxiety, anger outbursts (verbal), tearful, feelings of inadequacy  Risk Assessment:  Danger to Self:  No Self-injurious Behavior: No Danger to Others: No Duty to Warn:no Physical Aggression / Violence:No  Access to Firearms a concern: No  Gang Involvement:No  Patient / guardian was educated about steps to take if suicide or homicide risk level increases between visits: yes While future psychiatric events cannot be accurately predicted, the patient does not currently require acute inpatient psychiatric care and does not currently meet [facility] involuntary commitment criteria.  Subjective:  Patient presents for today's session.    Assessed progress.  Patient shared feeling "burned out".  He went on to share recent events related to his daughter, her continuing to have challenging behaviors day-to-day that can bring stress to the household.  He stated that he tries to be supportive of his wife who teaches her most days and then on Friday his daughter goes to a co-op group.  Facilitated his identifying needs, he stated that he often secludes himself in a bedroom in the house that he can work out of and also sleeps in this room.  He stated that he will stay in his room at times more excessively just to give himself  moments of reprieve.  Assessed his marital relationship where he stated they have been doing "better", less arguments most of their stresses centered around coping with their daughter's needs.  He identified further enjoying spending time with his sons, his youngest son who plays football.  Ways to continue to cope and care for himself, getting adequate rest as he stated that he is often tired and at some point possibly taking a vacation with his wife as he stated this been a few years.     Intervention: supportive therapy, motivational interviewing, problem solving   Diagnoses:  No diagnosis found.     Plan: Patient is to use CBT, mindfulness and coping skills,  Patient to follow through with coping skills discussed. Patient to continue to be mindful of his communication and maintaining boundaries to keep stress more manageable.    Long-term goal:    Reduce overall level, frequency, and intensity of the feelings of depression, anxiety and panic evidenced by decreased irritability, negative self talk  from 6 to 7 days/week to 0 to 1 days/week per client report for at least 3 consecutive months.    Short-term goal:  Verbally express understanding of the relationship between feelings of depression, anxiety and their impact on thinking patterns and behaviors. Verbalize an understanding of the role that distorted thinking plays in creating fears, excessive worry, and ruminations. Improve effective communication skills to reduce relational stress Decrease irritability frequency and anger outbursts  Progress: Progressing  [name] Session, [name]

## 2023-05-20 ENCOUNTER — Other Ambulatory Visit: Payer: Self-pay | Admitting: Adult Health

## 2023-05-22 ENCOUNTER — Other Ambulatory Visit (HOSPITAL_COMMUNITY): Payer: Self-pay

## 2023-05-22 ENCOUNTER — Other Ambulatory Visit: Payer: Self-pay

## 2023-05-22 NOTE — Telephone Encounter (Signed)
 Please schedule pt an appt. LV 10/9 DUE BACK IN 3 months.

## 2023-05-23 ENCOUNTER — Other Ambulatory Visit: Payer: Self-pay

## 2023-05-23 ENCOUNTER — Other Ambulatory Visit (HOSPITAL_COMMUNITY): Payer: Self-pay

## 2023-05-24 ENCOUNTER — Other Ambulatory Visit (HOSPITAL_COMMUNITY): Payer: Self-pay

## 2023-06-06 ENCOUNTER — Other Ambulatory Visit (HOSPITAL_COMMUNITY): Payer: Self-pay

## 2023-06-07 ENCOUNTER — Ambulatory Visit: Payer: No Typology Code available for payment source | Admitting: Mental Health

## 2023-06-07 DIAGNOSIS — F411 Generalized anxiety disorder: Secondary | ICD-10-CM | POA: Diagnosis not present

## 2023-06-07 NOTE — Progress Notes (Signed)
[facility] Psychotherapy Note  Name: Christian [name] Date: 06/07/23 MRN: [id number] DOB: 1975-10-12 PCP: Cleatis Polka., MD  Time spent:  50 minutes  Treatment: Individual Therapy  Mental Status Exam:    Appearance:   Casual     Behavior:  Appropriate  Motor:  Normal  Speech/Language:   Clear and Coherent    Affect:  Full range  Mood:    Euthymic  Thought process:  normal  Thought content:    WNL  Sensory/Perceptual disturbances:    WNL  Orientation:  x4  Attention:  Good  Concentration:  Good  Memory:  intact  Fund of knowledge:   WNL  Insight:    good  Judgment:   Good  Impulse Control:  Good   Reported Symptoms:  Irritable, depressed, anxiety, anger outbursts (verbal), tearful, feelings of inadequacy  Risk Assessment:  Danger to Self:  No Self-injurious Behavior: No Danger to Others: No Duty to Warn:no Physical Aggression / Violence:No  Access to Firearms a concern: No  Gang Involvement:No  Patient / guardian was educated about steps to take if suicide or homicide risk level increases between visits: yes While future psychiatric events cannot be accurately predicted, the patient does not currently require acute inpatient psychiatric care and does not currently meet [facility] involuntary commitment criteria.  Subjective:  Patient presents for today's session.  Patient shared ongoing stress at home.  He went on to share how his younger son and wife went out of town due to his having a football game.  He stated that he helped look their room however, he stated that his wife purchased airfare not coinciding with these dates which ultimately meant that he had to spend more money to compensate.  Patient shared frustration with this as well as being at home with his daughter.  He went on to share details, her continuing to eat excessively which is typical, he feels he has a tendency to obsess about food going on to share additional examples.  He stated his  wife is to arrive home today, he acknowledged feeling nervous due to her potential to complain when she gets home after being on hold and patient stated that he tries to keep up with daily household tasks such as cleaning, cooking etc.  Ways to cope and care for himself were explored, and plans to attempt to set some boundaries in some ways to communicate with his wife were explored collaboratively.  Also, ways to allow for himself to get time away to focus on work.  Intervention: supportive therapy, motivational interviewing, problem solving   Diagnoses:    ICD-10-CM   1. Generalized anxiety disorder  F41.1           Plan: Patient is to use CBT, mindfulness and coping skills,  Patient to follow through with coping skills discussed. Patient to continue to be mindful of his communication and maintaining boundaries to keep stress more manageable.    Long-term goal:    Reduce overall level, frequency, and intensity of the feelings of depression, anxiety and panic evidenced by decreased irritability, negative self talk  from 6 to 7 days/week to 0 to 1 days/week per client report for at least 3 consecutive months.    Short-term goal:  Verbally express understanding of the relationship between feelings of depression, anxiety and their impact on thinking patterns and behaviors. Verbalize an understanding of the role that distorted thinking plays in creating fears, excessive worry, and ruminations. Improve effective communication skills  to reduce relational stress Decrease irritability frequency and anger outbursts  Progress: Progressing  [name] Session, [facility]

## 2023-06-12 ENCOUNTER — Other Ambulatory Visit (HOSPITAL_COMMUNITY): Payer: Self-pay

## 2023-06-15 ENCOUNTER — Other Ambulatory Visit (HOSPITAL_COMMUNITY): Payer: Self-pay

## 2023-06-15 ENCOUNTER — Other Ambulatory Visit: Payer: Self-pay

## 2023-06-16 ENCOUNTER — Other Ambulatory Visit: Payer: Self-pay

## 2023-06-19 ENCOUNTER — Ambulatory Visit (INDEPENDENT_AMBULATORY_CARE_PROVIDER_SITE_OTHER): Payer: No Typology Code available for payment source | Admitting: Mental Health

## 2023-06-19 DIAGNOSIS — F411 Generalized anxiety disorder: Secondary | ICD-10-CM

## 2023-06-19 DIAGNOSIS — F4323 Adjustment disorder with mixed anxiety and depressed mood: Secondary | ICD-10-CM | POA: Diagnosis not present

## 2023-06-19 NOTE — Progress Notes (Signed)
[facility] Psychotherapy Note  Name: Christian [name] Date: 06/19/23 MRN: [id number] DOB: 1976-05-11 PCP: Cleatis Polka., MD  Time spent:  51 minutes  Treatment: Individual Therapy  Mental Status Exam:    Appearance:   Casual     Behavior:  Appropriate  Motor:  Normal  Speech/Language:   Clear and Coherent    Affect:  Full range  Mood:    Sad, pleasant  Thought process:  normal  Thought content:    WNL  Sensory/Perceptual disturbances:    WNL  Orientation:  x4  Attention:  Good  Concentration:  Good  Memory:  intact  Fund of knowledge:   WNL  Insight:    good  Judgment:   Good  Impulse Control:  Good   Reported Symptoms:  Irritable, depressed, anxiety, anger outbursts (verbal), tearful, feelings of inadequacy  Risk Assessment:  Danger to Self:  No Self-injurious Behavior: No Danger to Others: No Duty to Warn:no Physical Aggression / Violence:No  Access to Firearms a concern: No  Gang Involvement:No  Patient / guardian was educated about steps to take if suicide or homicide risk level increases between visits: yes While future psychiatric events cannot be accurately predicted, the patient does not currently require acute inpatient psychiatric care and does not currently meet [facility] involuntary commitment criteria.  Subjective:  Patient presents for today's session.  He shared recent events, continued stress at home.  He stated that his wife took their daughter to a volleyball term and over the weekend.  He went on to share details, how their daughter struggles with hygiene related issues and how this became an argument between her and her mother.  He stated that his wife reaches out to him multiple times, often yelling at him going on to give various examples of the many instances recently.  He stated that her yelling can often be loaded with significant anger, being critical of him, blaming.  He stated that he is been off his medication recently but  plans to restart.  He stated he has noticed he has been more emotionally upset when stressors increase.  He continues to have stress with his daughter, his wife continues to homeschool her but, due to her developmental challenges, this is difficult.  He often is tired, he has lost about 45 pounds over the last few months and he states that he is no longer snoring therefore has not used his CPAP.  He feels his chronic fatigue is related to stress.  Ways to cope and care for himself was explored collaboratively, ways to potentially set boundaries so that he can focus on his full-time job as this is often challenging due to his working from home.  He expresses concern if he went back to working on site as his wife is heavily reliant on his being at home.    Intervention: supportive therapy, motivational interviewing, problem solving   Diagnoses:    ICD-10-CM   1. Generalized anxiety disorder  F41.1     2. Adjustment disorder with mixed anxiety and depressed mood  F43.23         Plan: Patient is to use CBT, mindfulness and coping skills,  Patient to follow through with coping skills discussed. Patient to continue to be mindful of his communication and maintaining boundaries to keep stress more manageable.    Long-term goal:    Reduce overall level, frequency, and intensity of the feelings of depression, anxiety and panic evidenced by decreased irritability, negative self  talk  from 6 to 7 days/week to 0 to 1 days/week per client report for at least 3 consecutive months.    Short-term goal:  Verbally express understanding of the relationship between feelings of depression, anxiety and their impact on thinking patterns and behaviors. Verbalize an understanding of the role that distorted thinking plays in creating fears, excessive worry, and ruminations. Improve effective communication skills to reduce relational stress Decrease irritability frequency and anger outbursts  Progress:  Progressing  [name] Session, [facility]

## 2023-06-23 NOTE — Telephone Encounter (Signed)
 LM for pt to schedule with Gina

## 2023-06-28 ENCOUNTER — Other Ambulatory Visit (HOSPITAL_COMMUNITY): Payer: Self-pay

## 2023-06-29 ENCOUNTER — Other Ambulatory Visit (HOSPITAL_COMMUNITY): Payer: Self-pay

## 2023-07-06 ENCOUNTER — Ambulatory Visit: Payer: No Typology Code available for payment source | Admitting: Mental Health

## 2023-07-06 ENCOUNTER — Other Ambulatory Visit: Payer: Self-pay

## 2023-07-07 ENCOUNTER — Other Ambulatory Visit (HOSPITAL_COMMUNITY): Payer: Self-pay

## 2023-07-07 MED ORDER — AJOVY 225 MG/1.5ML ~~LOC~~ SOSY
1.5000 mL | PREFILLED_SYRINGE | SUBCUTANEOUS | 11 refills | Status: AC
  Filled 2023-07-07: qty 1.5, 30d supply, fill #0
  Filled 2023-07-20 – 2023-08-12 (×2): qty 1.5, 30d supply, fill #1
  Filled 2023-08-18 – 2023-09-13 (×2): qty 1.5, 30d supply, fill #2
  Filled 2023-10-12: qty 1.5, 30d supply, fill #3
  Filled 2023-11-12: qty 1.5, 30d supply, fill #4
  Filled 2023-12-18: qty 1.5, 30d supply, fill #5

## 2023-07-13 ENCOUNTER — Other Ambulatory Visit (HOSPITAL_COMMUNITY): Payer: Self-pay

## 2023-07-17 ENCOUNTER — Ambulatory Visit: Payer: No Typology Code available for payment source | Admitting: Mental Health

## 2023-07-17 DIAGNOSIS — F411 Generalized anxiety disorder: Secondary | ICD-10-CM

## 2023-07-17 NOTE — Progress Notes (Signed)
 [facility] Psychotherapy Note  Name: Christian [name] Date: 06/19/23 MRN: [id number] DOB: 01-10-1976 PCP: Cleatis Polka., MD  Time spent:  51 minutes  Treatment: Individual Therapy  Mental Status Exam:    Appearance:   Casual     Behavior:  Appropriate  Motor:  Normal  Speech/Language:   Clear and Coherent    Affect:  Full range  Mood:    Sad, pleasant  Thought process:  normal  Thought content:    WNL  Sensory/Perceptual disturbances:    WNL  Orientation:  x4  Attention:  Good  Concentration:  Good  Memory:  intact  Fund of knowledge:   WNL  Insight:    good  Judgment:   Good  Impulse Control:  Good   Reported Symptoms:  Irritable, depressed, anxiety, anger outbursts (verbal), tearful, feelings of inadequacy  Risk Assessment:  Danger to Self:  No Self-injurious Behavior: No Danger to Others: No Duty to Warn:no Physical Aggression / Violence:No  Access to Firearms a concern: No  Gang Involvement:No  Patient / guardian was educated about steps to take if suicide or homicide risk level increases between visits: yes While future psychiatric events cannot be accurately predicted, the patient does not currently require acute inpatient psychiatric care and does not currently meet [facility] involuntary commitment criteria.  Subjective:  Patient presents for today's session.  Patient shared progress since last visit which was about 1 month ago.  He stated that he continues to be challenging at home, he stated that their daughter continues to have her developmental needs going on to share how this adds a level of ongoing stress at home and in their relationship.  Patient continues to have to block cabinets to avoid her from going through their pantry and eating all the food, leaving it around the house and making a mess.  He shared how he tries to take some of the burden off his wife as she is home with her throughout the day by taking her to volleyball practice  and in other capacities.  He stated that his daughter enrolled in mainstream school about 2 weeks ago, her current schedule is to be there for about 4 hours/day but this may change after next month.  Provide support and time for patient to process feelings related to the stress in his marital relationship, stating that his wife chronically complains about the situation with her daughter as well as other issues.  He stated that he often does not have the energy to try and work on their relationship particularly due to feeling invalidated or criticized by his wife.  He identified the needed time to himself as valuable as he has his own bedroom primarily instituted to get sleep more consistently which he states has been helpful.  He stated that he has also lost about 50 pounds at this point and plans to continue as a way to cope and care for himself.  Encouraged patient to focus on self-care, balancing responsibilities between work and home, making time for himself, getting proper rest.   Intervention: supportive therapy, motivational interviewing, problem solving   Diagnoses:    ICD-10-CM   1. Generalized anxiety disorder  F41.1          Plan: Patient is to use CBT, mindfulness and coping skills,  Patient to follow through with coping skills discussed. Patient to continue to be mindful of his communication and maintaining boundaries to keep stress more manageable.    Long-term goal:  Reduce overall level, frequency, and intensity of the feelings of depression, anxiety and panic evidenced by decreased irritability, negative self talk  from 6 to 7 days/week to 0 to 1 days/week per client report for at least 3 consecutive months.    Short-term goal:  Verbally express understanding of the relationship between feelings of depression, anxiety and their impact on thinking patterns and behaviors. Verbalize an understanding of the role that distorted thinking plays in creating fears, excessive worry,  and ruminations. Improve effective communication skills to reduce relational stress Decrease irritability frequency and anger outbursts  Progress: Progressing  [name] Session, [facility]

## 2023-07-20 ENCOUNTER — Other Ambulatory Visit (HOSPITAL_COMMUNITY): Payer: Self-pay

## 2023-07-21 ENCOUNTER — Other Ambulatory Visit: Payer: Self-pay

## 2023-07-24 ENCOUNTER — Other Ambulatory Visit (HOSPITAL_COMMUNITY): Payer: Self-pay

## 2023-07-31 ENCOUNTER — Ambulatory Visit: Payer: No Typology Code available for payment source | Admitting: Mental Health

## 2023-07-31 DIAGNOSIS — F411 Generalized anxiety disorder: Secondary | ICD-10-CM

## 2023-07-31 NOTE — Progress Notes (Signed)
 [facility] Psychotherapy Note  Name: Christian [name] Date:  07/31/23 MRN: [id number] DOB: 1976-03-17 PCP: Cleatis Polka., MD  Time spent:  50 minutes  Treatment: Individual Therapy  Mental Status Exam:    Appearance:   Casual     Behavior:  Appropriate  Motor:  Normal  Speech/Language:   Clear and Coherent    Affect:  Full range  Mood:    Anxious, pleasant  Thought process:  normal  Thought content:    WNL  Sensory/Perceptual disturbances:    WNL  Orientation:  x4  Attention:  Good  Concentration:  Good  Memory:  intact  Fund of knowledge:   WNL  Insight:    good  Judgment:   Good  Impulse Control:  Good   Reported Symptoms:  Irritable, depressed, anxiety, anger outbursts (verbal), tearful, feelings of inadequacy  Risk Assessment:  Danger to Self:  No Self-injurious Behavior: No Danger to Others: No Duty to Warn:no Physical Aggression / Violence:No  Access to Firearms a concern: No  Gang Involvement:No  Patient / guardian was educated about steps to take if suicide or homicide risk level increases between visits: yes While future psychiatric events cannot be accurately predicted, the patient does not currently require acute inpatient psychiatric care and does not currently meet [facility] involuntary commitment criteria.  Subjective:  Patient presents for today's session.  Assessed progress per patient shared ongoing marital stress.  He went on to share details over the past weekend, his wife taking their daughter to her volleyball term it while he stated home with her 2 foster children.  He stated this initially upset his wife due to her having to take their daughter as it can be stressful, however, patient stated he offered to switch where he would take her and she was stay home but then this was not what she wanted either.  Patient identified feeling he is often trying to figure out what his wife wants, his efforts to try to make daily activities more  manageable for his wife when possible.  He stated that although he offers this, she often will get upset and invalidate his efforts.  He stated that she sent him text messages and eventually called him "going off" regarding their some of their daughter's behaviors on the trip. He shared his efforts to try and communicate with her over the weekend as well as yesterday about their communication and relationship.  He stated that she is recently talked to a divorce attorney which she has done over the past on multiple occasions.  Reviewed how they may potentially benefit from couples counseling, he stated they have tried this in the past and that when he is mentioned this recently she declines.  Explored with patient ways to cope and care for himself, destress and try and manage some of his anxiety which he states was high over the weekend.  Intervention: supportive therapy, motivational interviewing, problem solving   Diagnoses:    ICD-10-CM   1. Generalized anxiety disorder  F41.1           Plan: Patient is to use CBT, mindfulness and coping skills,  Patient to follow through with coping skills discussed. Patient to continue to be mindful of his communication and maintaining boundaries to keep stress more manageable.    Long-term goal:    Reduce overall level, frequency, and intensity of the feelings of depression, anxiety and panic evidenced by decreased irritability, negative self talk  from 6 to 7  days/week to 0 to 1 days/week per client report for at least 3 consecutive months.    Short-term goal:  Verbally express understanding of the relationship between feelings of depression, anxiety and their impact on thinking patterns and behaviors. Verbalize an understanding of the role that distorted thinking plays in creating fears, excessive worry, and ruminations. Improve effective communication skills to reduce relational stress Decrease irritability frequency and anger outbursts  Progress:  Progressing  [name] Session, [facility]

## 2023-08-04 ENCOUNTER — Other Ambulatory Visit (HOSPITAL_COMMUNITY): Payer: Self-pay

## 2023-08-04 MED ORDER — DULOXETINE HCL 60 MG PO CPEP
60.0000 mg | ORAL_CAPSULE | Freq: Every day | ORAL | 11 refills | Status: AC
  Filled 2023-08-04: qty 30, 30d supply, fill #0
  Filled 2023-09-13: qty 30, 30d supply, fill #1
  Filled 2023-10-12: qty 30, 30d supply, fill #2
  Filled 2023-11-12: qty 30, 30d supply, fill #3
  Filled 2023-12-18: qty 30, 30d supply, fill #4

## 2023-08-14 ENCOUNTER — Ambulatory Visit (INDEPENDENT_AMBULATORY_CARE_PROVIDER_SITE_OTHER): Admitting: Mental Health

## 2023-08-14 DIAGNOSIS — F411 Generalized anxiety disorder: Secondary | ICD-10-CM

## 2023-08-14 NOTE — Progress Notes (Signed)
 [facility] Psychotherapy Note  Name: Christian [name] Date:  08/14/23 MRN: [id number] DOB: 02/11/1976 PCP: Cleatis Polka., MD  Time spent:  52 minutes  Treatment: Individual Therapy  Mental Status Exam:    Appearance:   Casual     Behavior:  Appropriate  Motor:  Normal  Speech/Language:   Clear and Coherent    Affect:  Full range  Mood:    Anxious, pleasant  Thought process:  normal  Thought content:    WNL  Sensory/Perceptual disturbances:    WNL  Orientation:  x4  Attention:  Good  Concentration:  Good  Memory:  intact  Fund of knowledge:   WNL  Insight:    good  Judgment:   Good  Impulse Control:  Good   Reported Symptoms:  Irritable, depressed, anxiety, anger outbursts (verbal), tearful, feelings of inadequacy  Risk Assessment:  Danger to Self:  No Self-injurious Behavior: No Danger to Others: No Duty to Warn:no Physical Aggression / Violence:No  Access to Firearms a concern: No  Gang Involvement:No  Patient / guardian was educated about steps to take if suicide or homicide risk level increases between visits: yes While future psychiatric events cannot be accurately predicted, the patient does not currently require acute inpatient psychiatric care and does not currently meet [facility] involuntary commitment criteria.  Subjective:  Patient presents for today's session. Assessed progress. 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 He went on to share how they came to have this opportunity. He expressed excitement and motivation provide the care needed, you went further to share how he and his wife have been able to effectively collaborate and providing the care thus far. He went on the share the relational stressors that have been ongoing regarding his oldest daughter. He examples of how they tried to set limits coming boundaries with her and various ways but most specifically of late her  continued persistence in eating food excessively. He stated that this has been an issue for the last few years and hell this, along with other behaviors she exhibits, causes significant stress in your life. Support in space was provided for him to process feelings related. He would like her to have more independence in her life, however, he states that she often does not take steps to apply herself he feels in part due to challenges with development but also with her choosing not to take steps and relying on both he and his wife and, at times, acting out behaviorally as a form of manipulation. Facilitated his further identifying needs where he plans to continue to work on setting meaningful boundaries and limits toward not enabling some of the behaviors.    Intervention: supportive therapy, motivational interviewing, problem solving   Diagnoses:    ICD-10-CM   1. Generalized anxiety disorder  F41.1         Plan: Patient is to use CBT, mindfulness and coping skills,  Patient to follow through with coping skills discussed. Patient to continue to be mindful of his communication and maintaining boundaries to keep stress more manageable.    Long-term goal:    Reduce overall level, frequency, and intensity of the feelings of depression, anxiety and panic evidenced by decreased irritability, negative self talk  from 6 to 7 days/week to 0 to 1 days/week per client report for at least 3 consecutive months.    Short-term goal:  Verbally express understanding of the relationship between  feelings of depression, anxiety and their impact on thinking patterns and behaviors. Verbalize an understanding of the role that distorted thinking plays in creating fears, excessive worry, and ruminations. Improve effective communication skills to reduce relational stress Decrease irritability frequency and anger outbursts  Progress: Progressing  [name] Session, [facility]

## 2023-08-18 ENCOUNTER — Other Ambulatory Visit (HOSPITAL_COMMUNITY): Payer: Self-pay

## 2023-08-18 ENCOUNTER — Other Ambulatory Visit: Payer: Self-pay | Admitting: Adult Health

## 2023-08-18 IMAGING — CT CT CARDIAC CORONARY ARTERY CALCIUM SCORE
3 series · 14 of 20 positions shown, 16 images · non-contrast
Comparison: None.

CLINICAL DATA: Hyperlipidemia

EXAM:
CT CARDIAC CORONARY ARTERY CALCIUM SCORE
TECHNIQUE: Non-contrast imaging through the heart was performed using
prospective ECG gating. Image post processing was performed on an
independent workstation, allowing for quantitative analysis of the
heart and coronary arteries. Note that this exam targets the heart
and the chest was not imaged in its entirety.

[Series 2: calcium scoring 2.00 qr36 bestdiast 70% hrt calciu · axial · 0.46mm/px · z∈[+1575,+1643]mm · 4 of 58 slices shown]
[im 12/58  vessel]
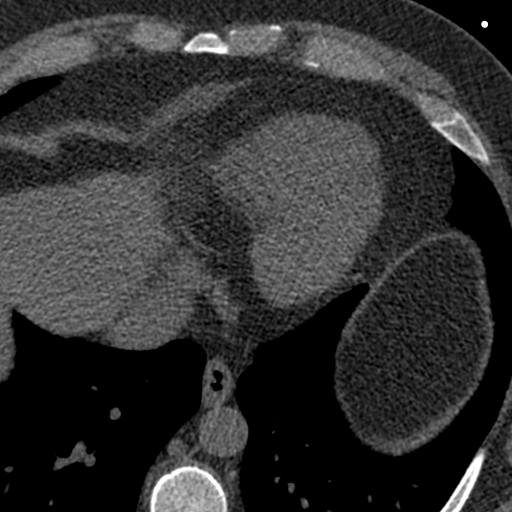
[im 23/58  vessel]
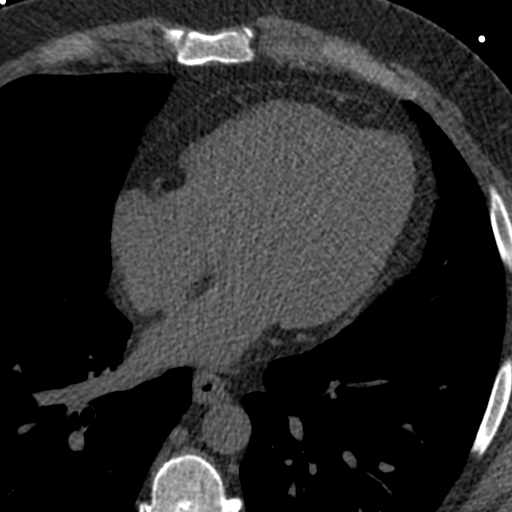
[im 35/58  vessel]
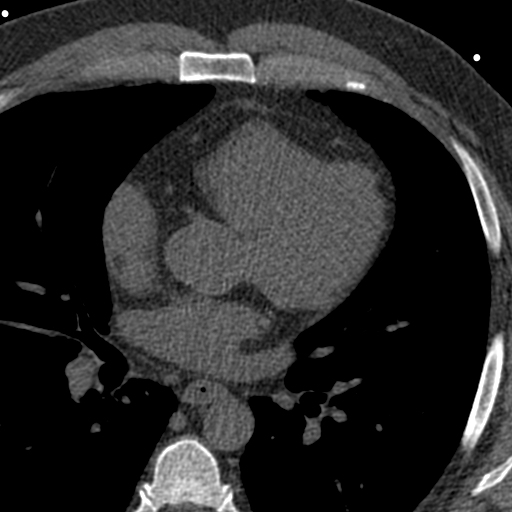
[im 46/58  vessel]
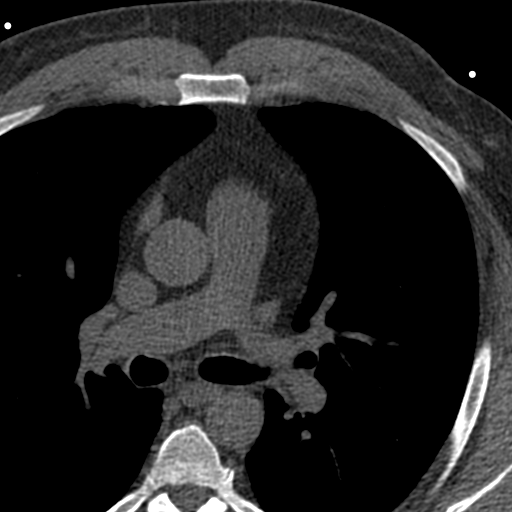

[Series 3: calcium scoring 2.00 br40 bestdiast 70% axial · axial · 0.72mm/px · z∈[+1569,+1647]mm · 5 of 59 slices shown, 7 images]
[im 10/59  vessel]
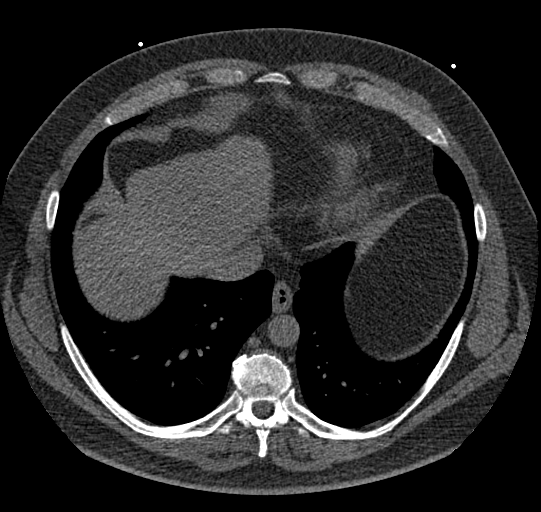
[im 10/59  lung]
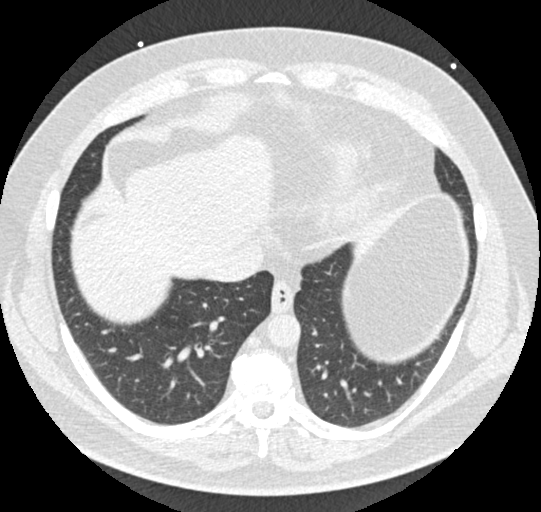
[im 20/59  vessel]
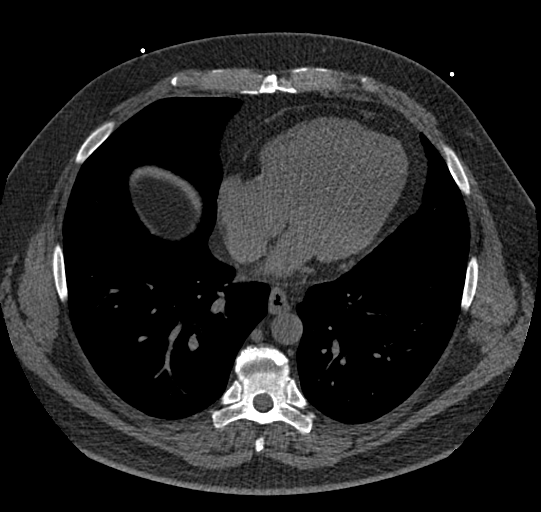
[im 30/59  vessel]
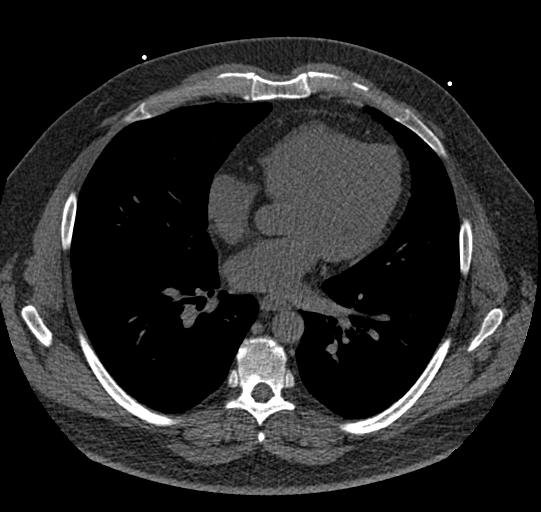
[im 39/59  vessel]
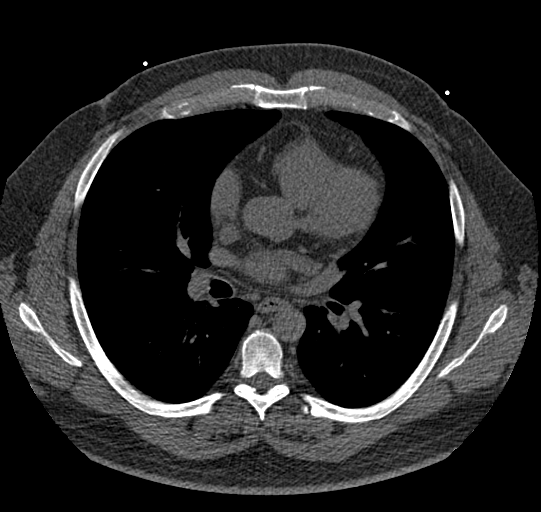
[im 49/59  vessel]
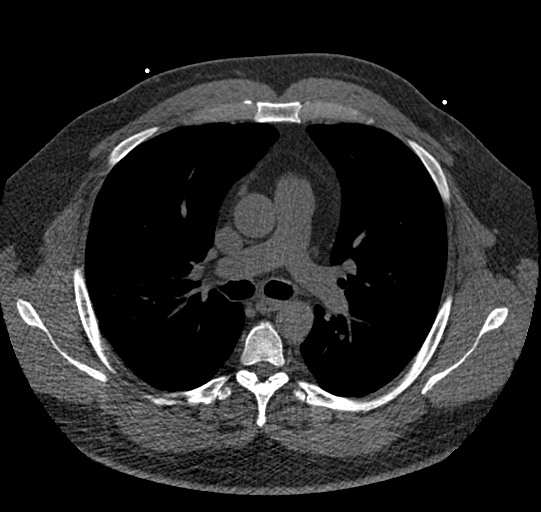
[im 49/59  lung]
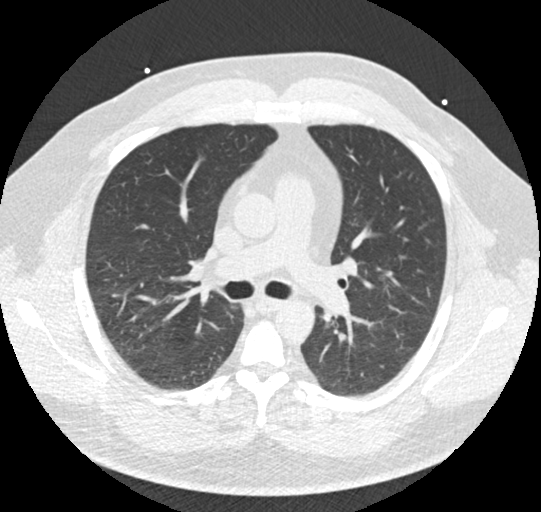

[Series 9: calcium scoring 2.00 br60 bestdiast 70% lungs · axial · 0.72mm/px · z∈[+1569,+1647]mm · 5 of 59 slices shown]
[im 10/59  vessel]
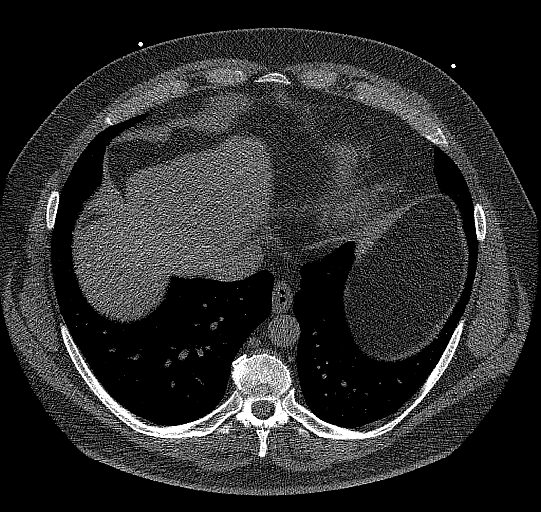
[im 20/59  vessel]
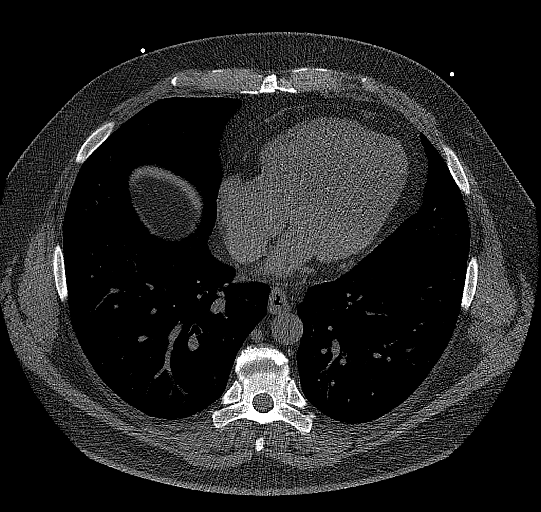
[im 30/59  vessel]
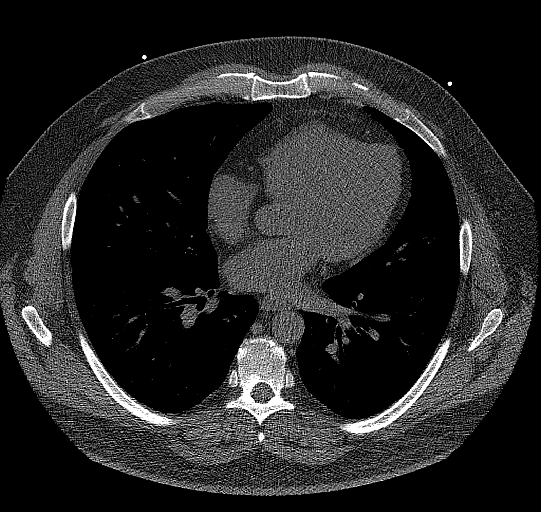
[im 39/59  vessel]
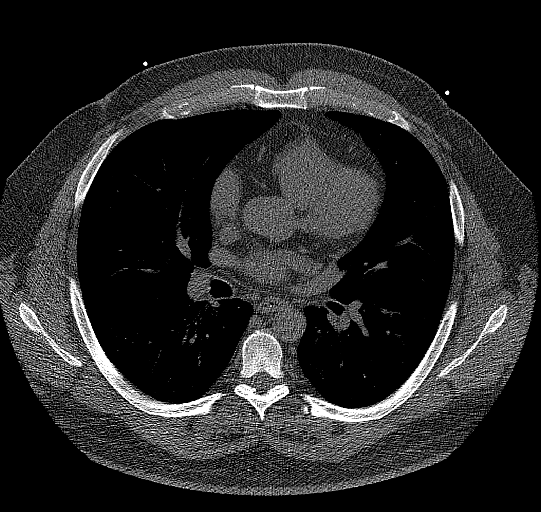
[im 49/59  vessel]
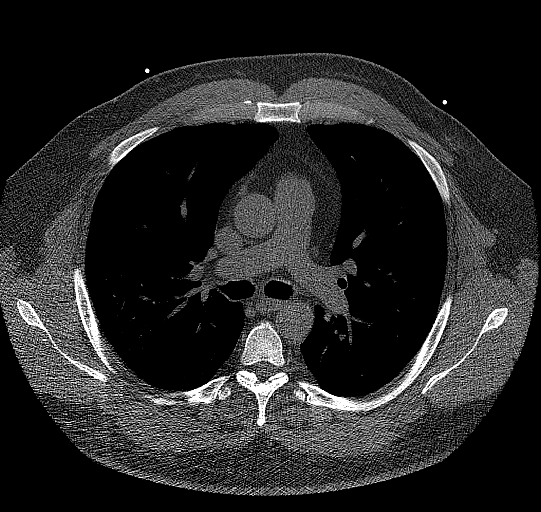

[14 of 20 positions shown; findings below may reference images not displayed]

FINDINGS: CORONARY CALCIUM SCORES:

Left Main: 0

LAD:

LCx:

RCA: 0

Total Agatston Score:

[HOSPITAL] percentile: 84. Note, this is compared to 45 year
olds, the youngest in the [HOSPITAL].

AORTA MEASUREMENTS:

Ascending Aorta: 29 mm

Descending Aorta: 25 mm

OTHER FINDINGS:

Heart is normal size. Scattered aortic calcifications. Normal
caliber aorta. No adenopathy. No confluent airspace opacities or
effusions. Imaging into the upper abdomen demonstrates no acute
findings. Chest wall soft tissues are unremarkable. No acute bony
abnormality.
IMPRESSION: Total Agatston score:

[HOSPITAL] percentile: 84, compared to 45 year olds, the youngest
age in the [HOSPITAL].

Scattered aortic calcifications.

## 2023-08-23 ENCOUNTER — Other Ambulatory Visit (HOSPITAL_COMMUNITY): Payer: Self-pay

## 2023-08-23 ENCOUNTER — Encounter (HOSPITAL_COMMUNITY): Payer: Self-pay

## 2023-08-25 ENCOUNTER — Other Ambulatory Visit (HOSPITAL_COMMUNITY): Payer: Self-pay

## 2023-08-25 MED ORDER — ZEPBOUND 15 MG/0.5ML ~~LOC~~ SOAJ
15.0000 mg | SUBCUTANEOUS | 0 refills | Status: DC
  Filled 2023-08-25: qty 2, 28d supply, fill #0

## 2023-09-04 ENCOUNTER — Ambulatory Visit: Admitting: Mental Health

## 2023-09-04 DIAGNOSIS — F411 Generalized anxiety disorder: Secondary | ICD-10-CM

## 2023-09-04 NOTE — Progress Notes (Signed)
 [facility] Psychotherapy Note  Name: Christian [name] Date:  09/04/23 MRN: [id number] DOB: Jan 26, 1976 PCP: Jeannine Milroy., MD  Time spent:  50 minutes Time in: 9 AM time out: 9: 50 a.m.  Treatment: Individual Therapy  Mental Status Exam:    Appearance:   Casual     Behavior:  Appropriate  Motor:  Normal  Speech/Language:   Clear and Coherent    Affect:  Full range  Mood:    Anxious, pleasant  Thought process:  normal  Thought content:    WNL  Sensory/Perceptual disturbances:    WNL  Orientation:  x4  Attention:  Good  Concentration:  Good  Memory:  intact  Fund of knowledge:   WNL  Insight:    good  Judgment:   Good  Impulse Control:  Good   Reported Symptoms:  Irritable, depressed, anxiety, anger outbursts (verbal), tearful, feelings of inadequacy  Risk Assessment:  Danger to Self:  No Self-injurious Behavior: No Danger to Others: No Duty to Warn:no Physical Aggression / Violence:No  Access to Firearms a concern: No  Gang Involvement:No  Patient / guardian was educated about steps to take if suicide or homicide risk level increases between visits: yes While future psychiatric events cannot be accurately predicted, the patient does not currently require acute inpatient psychiatric care and does not currently meet [place]  involuntary commitment criteria.  Subjective:  Patient presents for today's session.  Assessed progress per patient shared he has had a challenging weekend.  He went on to share how they went out of town to visit his wife's family, however, the plan was for him to return which he did with his 2 sons, daughter and infant foster child.  He stated that he and his wife agreed that she would be able to spend an additional day with family and then with a friend for her birthday.  He shared how it was challenging due to his children having an argument, eventually leading one of them to text their mother.  He stated this was disruptive to her  trip and he encouraged the children to give their mother space and he also emphasized this as well.  He went on to share how she returned home and after hearing from her children about the argument the next day, she got upset.  Patient shared how he only had 1 request which was to sleep in to get caught up with sleep due to staying up with the foster infant.  He stated this did not occur, eventually the family trying to share Easter holiday over lunch but this resulted in the argument among family members as well.  At this point he identified the need to seek out family counseling.  Facilitated his identifying feelings, needs while providing support and understanding.   Intervention: supportive therapy, motivational interviewing    Diagnoses:    ICD-10-CM   1. Generalized anxiety disorder  F41.1          Plan: Patient is to use CBT, mindfulness and coping skills,  Patient to follow through with coping skills discussed. Patient to continue to be mindful of his communication and maintaining boundaries to keep stress more manageable.    Long-term goal:    Reduce overall level, frequency, and intensity of the feelings of depression, anxiety and panic evidenced by decreased irritability, negative self talk  from 6 to 7 days/week to 0 to 1 days/week per client report for at least 3 consecutive months.    Short-term  goal:  Verbally express understanding of the relationship between feelings of depression, anxiety and their impact on thinking patterns and behaviors. Verbalize an understanding of the role that distorted thinking plays in creating fears, excessive worry, and ruminations. Improve effective communication skills to reduce relational stress Decrease irritability frequency and anger outbursts  Progress: Progressing  Avram Lenis, [facility]

## 2023-09-05 ENCOUNTER — Other Ambulatory Visit (HOSPITAL_COMMUNITY): Payer: Self-pay

## 2023-09-13 ENCOUNTER — Other Ambulatory Visit: Payer: Self-pay

## 2023-09-14 ENCOUNTER — Other Ambulatory Visit (HOSPITAL_COMMUNITY): Payer: Self-pay

## 2023-09-18 ENCOUNTER — Ambulatory Visit (INDEPENDENT_AMBULATORY_CARE_PROVIDER_SITE_OTHER): Admitting: Mental Health

## 2023-09-18 DIAGNOSIS — F411 Generalized anxiety disorder: Secondary | ICD-10-CM

## 2023-09-18 NOTE — Progress Notes (Signed)
 [facility] Psychotherapy Note  Name: Christian [name] Date:  09/18/23 MRN: [id number] DOB: 1975/11/17 PCP: Jeannine Milroy., MD  Time spent:  49 minutes Time in: 9 a.m.    time out: 9: 49 a.m.  Treatment: Individual Therapy  Mental Status Exam:    Appearance:   Casual     Behavior:  Appropriate  Motor:  Normal  Speech/Language:   Clear and Coherent    Affect:  Full range  Mood:    Anxious, pleasant  Thought process:  normal  Thought content:    WNL  Sensory/Perceptual disturbances:    WNL  Orientation:  x4  Attention:  Good  Concentration:  Good  Memory:  intact  Fund of knowledge:   WNL  Insight:    good  Judgment:   Good  Impulse Control:  Good   Reported Symptoms:  Irritable, depressed, anxiety, anger outbursts (verbal), tearful, feelings of inadequacy  Risk Assessment:  Danger to Self:  No Self-injurious Behavior: No Danger to Others: No Duty to Warn:no Physical Aggression / Violence:No  Access to Firearms a concern: No  Gang Involvement:No  Patient / guardian was educated about steps to take if suicide or homicide risk level increases between visits: yes While future psychiatric events cannot be accurately predicted, the patient does not currently require acute inpatient psychiatric care and does not currently meet [place]  involuntary commitment criteria.  Subjective:  Patient presents for today's session.  Assessed progress where he shared that they have another foster child along with the 46-week old infant they currently are caring for.  He stated his wife made the decision to get the 44-year-old foster child last Friday unbeknownst to him.  He stated that he understands the process, that you have to react quickly when they are looking for foster placement due to the timeframe of social services.  He went on to share how his wife is currently in the hospital coping with the medical issue.  He hopes that she can be discharged tomorrow and make a  full recovery.  He went on to share how they continue to have some strain in their relationship, often feeling criticized by her or judged.  He further identified feelings associated, frustration at times, recognizing her tendency to trying control situations.  He stated she has a difficulty managing stress, feels if the roles were reversed at this time she would be highly anxious and stressed.  He is thankful that his older son has been able to visit from college to help out over the weekend and there is some other support from family friends.  These changes along with ongoing challenges with his daughter who will often test limits at home, I have made it particularly stressful.  Explored ways to cope and care for himself during this time, getting enough rest which is challenging due to the infant waking at night however, he stated that he feels he is able to manage, values the support system in place.   Intervention: supportive therapy, motivational interviewing    Diagnoses:    ICD-10-CM   1. Generalized anxiety disorder  F41.1           Plan: Patient is to use CBT, mindfulness and coping skills,  Patient to follow through with coping skills discussed. Patient to continue to be mindful of his communication and maintaining boundaries to keep stress more manageable.    Long-term goal:    Reduce overall level, frequency, and intensity of the feelings of depression,  anxiety and panic evidenced by decreased irritability, negative self talk  from 6 to 7 days/week to 0 to 1 days/week per client report for at least 3 consecutive months.    Short-term goal:  Verbally express understanding of the relationship between feelings of depression, anxiety and their impact on thinking patterns and behaviors. Verbalize an understanding of the role that distorted thinking plays in creating fears, excessive worry, and ruminations. Improve effective communication skills to reduce relational stress Decrease  irritability frequency and anger outbursts  Progress: Progressing  Avram Lenis, [facility]

## 2023-09-19 ENCOUNTER — Other Ambulatory Visit (HOSPITAL_COMMUNITY): Payer: Self-pay

## 2023-09-19 MED ORDER — ZEPBOUND 15 MG/0.5ML ~~LOC~~ SOAJ
15.0000 mg | SUBCUTANEOUS | 0 refills | Status: DC
  Filled 2023-09-19: qty 2, 28d supply, fill #0

## 2023-10-02 ENCOUNTER — Ambulatory Visit: Admitting: Mental Health

## 2023-10-02 DIAGNOSIS — F411 Generalized anxiety disorder: Secondary | ICD-10-CM | POA: Diagnosis not present

## 2023-10-02 NOTE — Progress Notes (Signed)
 [facility] Psychotherapy Note  Name: Christian [name] Date:  10/02/23 MRN: [id number] DOB: June 14, 1975 PCP: Jeannine Milroy., MD  Time spent:  48 minutes Time in: 9 a.m.    time out: 9: 48 a.m.  Treatment: Individual Therapy  Mental Status Exam:    Appearance:   Casual     Behavior:  Appropriate  Motor:  Normal  Speech/Language:   Clear and Coherent    Affect:  Full range  Mood:    Anxious, pleasant  Thought process:  normal  Thought content:    WNL  Sensory/Perceptual disturbances:    WNL  Orientation:  x4  Attention:  Good  Concentration:  Good  Memory:  intact  Fund of knowledge:   WNL  Insight:    good  Judgment:   Good  Impulse Control:  Good   Reported Symptoms:  Irritable, depressed, anxiety, anger outbursts (verbal), tearful, feelings of inadequacy  Risk Assessment:  Danger to Self:  No Self-injurious Behavior: No Danger to Others: No Duty to Warn:no Physical Aggression / Violence:No  Access to Firearms a concern: No  Gang Involvement:No  Patient / guardian was educated about steps to take if suicide or homicide risk level increases between visits: yes While future psychiatric events cannot be accurately predicted, the patient does not currently require acute inpatient psychiatric care and does not currently meet [place]  involuntary commitment criteria.  Subjective:  Patient presents for today's session.  Patient shared how he and his wife continue to provide support to the children, recently sharing experiences with their 2 foster children, ages 84 and 8 months.  He stated that they have enjoyed the process, feels it is nothing together in the relationship as well.  He stated that he is going very attached to the children and how they both support each other by working together to meet their needs.  Facilitated his identifying ways he feels a bit effective with his communication in his relationship with his wife.  Discussed the potential benefits  of their spending some time together such as going on days where he stated they did say recently, sharing how it was enjoyable. He shared recent conversation with his mother where his father, in the background, made a negative comment about his inheritance.  He stated that the family has had a family farm for about a Century and how his father told him he would only let him have it if he paid him for it.  He shared how he and his father, strained relationship for the past several years, not speaking to him, for the past 5 years.  He further process feelings related to the relationship, shared some history, reasons why he continues to maintain this boundary.  He continues to have communication in a relationship with his mother.   Intervention: supportive therapy, motivational interviewing    Diagnoses:    ICD-10-CM   1. Generalized anxiety disorder  F41.1            Plan: Patient is to use CBT, mindfulness and coping skills,  Patient to follow through with coping skills discussed. Patient to continue to be mindful of his communication and maintaining boundaries to keep stress more manageable.    Long-term goal:    Reduce overall level, frequency, and intensity of the feelings of depression, anxiety and panic evidenced by decreased irritability, negative self talk  from 6 to 7 days/week to 0 to 1 days/week per client report for at least 3 consecutive months.  Short-term goal:  Verbally express understanding of the relationship between feelings of depression, anxiety and their impact on thinking patterns and behaviors. Verbalize an understanding of the role that distorted thinking plays in creating fears, excessive worry, and ruminations. Improve effective communication skills to reduce relational stress Decrease irritability frequency and anger outbursts  Progress: Progressing  Avram Lenis, [facility]

## 2023-10-12 ENCOUNTER — Other Ambulatory Visit (HOSPITAL_COMMUNITY): Payer: Self-pay

## 2023-10-12 ENCOUNTER — Other Ambulatory Visit: Payer: Self-pay

## 2023-10-17 ENCOUNTER — Ambulatory Visit: Admitting: Mental Health

## 2023-10-18 ENCOUNTER — Other Ambulatory Visit (HOSPITAL_COMMUNITY): Payer: Self-pay

## 2023-10-18 MED ORDER — SEMAGLUTIDE-WEIGHT MANAGEMENT 2.4 MG/0.75ML ~~LOC~~ SOAJ
2.4000 mg | SUBCUTANEOUS | 0 refills | Status: DC
  Filled 2023-10-18: qty 3, 28d supply, fill #0

## 2023-10-19 ENCOUNTER — Ambulatory Visit: Admitting: Mental Health

## 2023-10-19 DIAGNOSIS — F411 Generalized anxiety disorder: Secondary | ICD-10-CM

## 2023-10-19 NOTE — Progress Notes (Signed)
 [facility] Psychotherapy Note  Name: Christian [name] Date:  10/19/23 MRN: [id number] DOB: Apr 07, 1976 PCP: Jeannine Milroy., MD  Time spent:  49 minutes Time in: 9 a.m.    time out: 9: 49 a.m.  Treatment: Individual Therapy  Mental Status Exam:    Appearance:   Casual     Behavior:  Appropriate  Motor:  Normal  Speech/Language:   Clear and Coherent    Affect:  Full range  Mood:    Anxious, pleasant  Thought process:  normal  Thought content:    WNL  Sensory/Perceptual disturbances:    WNL  Orientation:  x4  Attention:  Good  Concentration:  Good  Memory:  intact  Fund of knowledge:   WNL  Insight:    good  Judgment:   Good  Impulse Control:  Good   Reported Symptoms:  Irritable, depressed, anxiety, anger outbursts (verbal), tearful, feelings of inadequacy  Risk Assessment:  Danger to Self:  No Self-injurious Behavior: No Danger to Others: No Duty to Warn:no Physical Aggression / Violence:No  Access to Firearms a concern: No  Gang Involvement:No  Patient / guardian was educated about steps to take if suicide or homicide risk level increases between visits: yes While future psychiatric events cannot be accurately predicted, the patient does not currently require acute inpatient psychiatric care and does not currently meet [place]  involuntary commitment criteria.  Subjective:  Patient presents for today's session.  Assessed progress where he shared challenges with his oldest daughter.  He stated that she required psychiatric inpatient about 2 weeks ago due to her aggression in the home.  He stated they have tried to find residential placement for her, long-term as well as a residential treatment placement.  He stated that, although they had 1 placement fall through, they have another that they are hopeful they will hear from soon which will provide admission.  Provide support and understanding as he process feelings related to the process, disappointment,  frustration but also hopefulness that his daughter will get treatment and his household will be more peaceful as a result.  Some ongoing strain in his marital relationship occurs at times, his wife making hurtful comments toward him when she has expectations of him to "handle the situation" regarding their oldest daughter.  Ways he manages these comments, how he reminds himself that it does not have to bother him as she has done this over the years intermittently.  They continue to also care for their 2 foster children, one infant and one 72-year-old.  He went on to share how he is going significantly attached to the baby as he spends a considerable amount of time caring for the infant.   Intervention: supportive therapy, motivational interviewing    Diagnoses:    ICD-10-CM   1. Generalized anxiety disorder  F41.1        Plan: Patient is to use CBT, mindfulness and coping skills,  Patient to follow through with coping skills discussed. Patient to continue to be mindful of his communication and maintaining boundaries to keep stress more manageable.    Long-term goal:    Reduce overall level, frequency, and intensity of the feelings of depression, anxiety and panic evidenced by decreased irritability, negative self talk  from 6 to 7 days/week to 0 to 1 days/week per client report for at least 3 consecutive months.    Short-term goal:  Verbally express understanding of the relationship between feelings of depression, anxiety and their impact on thinking  patterns and behaviors. Verbalize an understanding of the role that distorted thinking plays in creating fears, excessive worry, and ruminations. Improve effective communication skills to reduce relational stress Decrease irritability frequency and anger outbursts  Progress: Progressing  Avram Lenis, [facility]

## 2023-10-20 ENCOUNTER — Other Ambulatory Visit (HOSPITAL_COMMUNITY): Payer: Self-pay

## 2023-11-02 ENCOUNTER — Ambulatory Visit: Admitting: Mental Health

## 2023-11-02 DIAGNOSIS — F411 Generalized anxiety disorder: Secondary | ICD-10-CM | POA: Diagnosis not present

## 2023-11-02 NOTE — Progress Notes (Signed)
 [facility] Psychotherapy Note  Name: Christian [name] Date:  11/02/23 MRN: [id number] DOB: [birth year] PCP: Jeannine Milroy., MD  Time spent:  50 minutes Time in: 9:00 a.m.    time out: 9: 50 a.m.  Treatment: Individual Therapy  Mental Status Exam:    Appearance:   Casual     Behavior:  Appropriate  Motor:  Normal  Speech/Language:   Clear and Coherent    Affect:  Full range  Mood:    euthymic  Thought process:  normal  Thought content:    WNL  Sensory/Perceptual disturbances:    WNL  Orientation:  x4  Attention:  Good  Concentration:  Good  Memory:  intact  Fund of knowledge:   WNL  Insight:    good  Judgment:   Good  Impulse Control:  Good   Reported Symptoms:  Irritable, depressed, anxiety, anger outbursts (verbal), tearful, feelings of inadequacy  Risk Assessment:  Danger to Self:  No Self-injurious Behavior: No Danger to Others: No Duty to Warn:no Physical Aggression / Violence:No  Access to Firearms a concern: No  Gang Involvement:No  Patient / guardian was educated about steps to take if suicide or homicide risk level increases between visits: yes While future psychiatric events cannot be accurately predicted, the patient does not currently require acute inpatient psychiatric care and does not currently meet [place]  involuntary commitment criteria.  Subjective:  Patient presents for today's session.  Patient shared recent changes, his daughter now inpatient at a mental health facility.  He shared some details related to her being impatient and the challenges of their finding an appropriate program.  He stated that there has been less stress in the household since she has been gone as she had become more defiant, disruptive and at times, aggressive.  He went on to share how they continue to care for their children as well as the 2 foster children in their care, a young child and an infant.  He shared how he has been focused primarily on the  infant's needs as the he stated both he and his wife work together to meet the children's needs.  He stated that he and his wife continue to have some marital strain.  This was explored collaboratively, facilitating his identifying needs, ways to communicate their relationship as this can be an outlet.  Intervention: supportive therapy, motivational interviewing    Diagnoses:    ICD-10-CM   1. Generalized anxiety disorder  F41.1         Plan: Patient is to use CBT, mindfulness and coping skills,  Patient to follow through with coping skills discussed. Patient to continue to be mindful of his communication and maintaining boundaries to keep stress more manageable.    Long-term goal:    Reduce overall level, frequency, and intensity of the feelings of depression, anxiety and panic evidenced by decreased irritability, negative self talk  from 6 to 7 days/week to 0 to 1 days/week per client report for at least 3 consecutive months.    Short-term goal:  Verbally express understanding of the relationship between feelings of depression, anxiety and their impact on thinking patterns and behaviors. Verbalize an understanding of the role that distorted thinking plays in creating fears, excessive worry, and ruminations. Improve effective communication skills to reduce relational stress Decrease irritability frequency and anger outbursts  Progress: Progressing  Avram Lenis, [facility]

## 2023-11-08 ENCOUNTER — Other Ambulatory Visit (HOSPITAL_COMMUNITY): Payer: Self-pay

## 2023-11-08 MED ORDER — SEMAGLUTIDE-WEIGHT MANAGEMENT 2.4 MG/0.75ML ~~LOC~~ SOAJ
2.4000 mg | SUBCUTANEOUS | 0 refills | Status: DC
  Filled 2023-11-08 – 2023-11-12 (×2): qty 3, 28d supply, fill #0

## 2023-11-13 ENCOUNTER — Other Ambulatory Visit (HOSPITAL_COMMUNITY): Payer: Self-pay

## 2023-11-14 ENCOUNTER — Ambulatory Visit: Admitting: Mental Health

## 2023-11-21 ENCOUNTER — Ambulatory Visit: Admitting: Mental Health

## 2023-11-21 DIAGNOSIS — F411 Generalized anxiety disorder: Secondary | ICD-10-CM | POA: Diagnosis not present

## 2023-11-21 NOTE — Progress Notes (Signed)
 [facility]  Name: Christian [name] Date:  11/21/23 MRN: [id number] DOB: Jul 28, 1975 PCP: Loreli Elsie [name], MD  Time spent:  52 minutes Time in: 9:00 a.m.    time out: 9: 52 a.m.  Treatment: Individual Therapy  Mental Status Exam:    Appearance:   Casual     Behavior:  Appropriate  Motor:  Normal  Speech/Language:   Clear and Coherent    Affect:  Full range  Mood:    euthymic  Thought process:  normal  Thought content:    WNL  Sensory/Perceptual disturbances:    WNL  Orientation:  x4  Attention:  Good  Concentration:  Good  Memory:  intact  Fund of knowledge:   WNL  Insight:    good  Judgment:   Good  Impulse Control:  Good   Reported Symptoms:  Irritable, depressed, anxiety, anger outbursts (verbal), tearful, feelings of inadequacy  Risk Assessment:  Danger to Self:  No Self-injurious Behavior: No Danger to Others: No Duty to Warn:no Physical Aggression / Violence:No  Access to Firearms a concern: No  Gang Involvement:No  Patient / guardian was educated about steps to take if suicide or homicide risk level increases between visits: yes While future psychiatric events cannot be accurately predicted, the patient does not currently require acute inpatient psychiatric care and does not currently meet [place]  involuntary commitment criteria.  Subjective:  Patient presents for today's session.   Patient shared recent events, progress.  He stated that he was able to secure a residential placement for his daughter.  He went on to share the many challenges of the process but how he eventually was able to have her placed.  He went on to share how he was hopeful this would bring some level of stress relief to the household, however, he went on to share how his wife decided on her own to take on another young child in foster care.  At this point, they have 3 children, ages 68, 5 and an infant.  This adds to his busy schedule, continuing to try and  balance work along with many household responsibilities.  His wife continues to make critical comments, often telling him what he has done wrong.  Ways he tries to express himself effectively was shared.  Provide support and understanding throughout.  Facilitated his identifying his needs where he was able to communicate with his wife the potential benefit of their stepping away from providing foster care as a way to reduce their stress.   Intervention: supportive therapy, motivational interviewing    Diagnoses:    ICD-10-CM   1. Generalized anxiety disorder  F41.1          Plan: Patient is to use CBT, mindfulness and coping skills,  Patient to follow through with coping skills discussed. Patient to continue to be mindful of his communication and maintaining boundaries to keep stress more manageable.    Long-term goal:    Reduce overall level, frequency, and intensity of the feelings of depression, anxiety and panic evidenced by decreased irritability, negative self talk  from 6 to 7 days/week to 0 to 1 days/week per client report for at least 3 consecutive months.    Short-term goal:  Verbally express understanding of the relationship between feelings of depression, anxiety and their impact on thinking patterns and behaviors. Verbalize an understanding of the role that distorted thinking plays in creating fears, excessive worry, and ruminations. Improve effective communication skills to reduce relational stress  Decrease irritability frequency and anger outbursts  Progress: Progressing  Christian [name], [facility]

## 2023-12-04 ENCOUNTER — Other Ambulatory Visit (HOSPITAL_COMMUNITY): Payer: Self-pay

## 2023-12-04 MED ORDER — MORPHINE SULFATE 15 MG PO TABS
15.0000 mg | ORAL_TABLET | Freq: Three times a day (TID) | ORAL | 0 refills | Status: DC | PRN
  Filled 2023-12-04: qty 15, 5d supply, fill #0

## 2023-12-05 ENCOUNTER — Ambulatory Visit: Admitting: Mental Health

## 2023-12-09 ENCOUNTER — Other Ambulatory Visit: Payer: Self-pay | Admitting: Adult Health

## 2023-12-09 DIAGNOSIS — F4323 Adjustment disorder with mixed anxiety and depressed mood: Secondary | ICD-10-CM

## 2023-12-10 ENCOUNTER — Encounter (HOSPITAL_COMMUNITY): Payer: Self-pay

## 2023-12-11 ENCOUNTER — Other Ambulatory Visit (HOSPITAL_COMMUNITY): Payer: Self-pay

## 2023-12-15 ENCOUNTER — Other Ambulatory Visit (HOSPITAL_COMMUNITY): Payer: Self-pay

## 2023-12-19 ENCOUNTER — Ambulatory Visit (INDEPENDENT_AMBULATORY_CARE_PROVIDER_SITE_OTHER): Admitting: Mental Health

## 2023-12-19 DIAGNOSIS — F411 Generalized anxiety disorder: Secondary | ICD-10-CM

## 2023-12-19 NOTE — Progress Notes (Signed)
 [facility] Psych[facility] Note  Name: Christian [name] Date:  12/19/23 MRN: [id number] DOB: [birth year] PCP: Loreli Elsie [name], MD  Time spent:  51 minutes Time in: 8:00 a.m.    time out:8:01 a.m.  Treatment: Individual Therapy  Mental Status Exam:    Appearance:   Casual     Behavior:  Appropriate  Motor:  Normal  Speech/Language:   Clear and Coherent    Affect:  Full range  Mood:    euthymic  Thought process:  normal  Thought content:    WNL  Sensory/Perceptual disturbances:    WNL  Orientation:  x4  Attention:  Good  Concentration:  Good  Memory:  intact  Fund of knowledge:   WNL  Insight:    good  Judgment:   Good  Impulse Control:  Good   Reported Symptoms:  Irritable, depressed, anxiety, anger outbursts (verbal), tearful, feelings of inadequacy  Risk Assessment:  Danger to Self:  No Self-injurious Behavior: No Danger to Others: No Duty to Warn:no Physical Aggression / Violence:No  Access to Firearms a concern: No  Gang Involvement:No  Patient / guardian was educated about steps to take if suicide or homicide risk level increases between visits: yes While future psychiatric events cannot be accurately predicted, the patient does not currently require acute inpatient psychiatric care and does not currently meet [name]  involuntary commitment criteria.  Subjective:  Patient presents for today's session.   Patient shared recent events, progress.  He shared various examples of his family life, relationship with his wife specifically where he continues to have some stress.  He reports how he enjoyed spending time with his son over his birthday, taking him and a couple of friends to the beach.  He stated that he did this for his son but also cooperatively with his wife who requested that he feel this role while she took care of the 2 younger children.  He went on to share how his wife can get easily overwhelmed and subsequently make him feel responsible.   Provide support and understanding throughout, facilitating his framing needs and how he wants to continue to communicate effectively while also trying to manage his own stress levels.  At this point, they are unsure if they are going to keep the 2 foster children for adoption, primarily due to his wife being inconsistent with her decision as he stated she vacillates.  Ways to cope and care for himself, trying to get enough rest to keep up with work demands as well as other tasks around the house was explored collaboratively to help manage stress.  He stated that he has a doctor appointment in the hopes to reduce some of his chronic back pain.   Intervention: supportive therapy, motivational interviewing    Diagnoses:  No diagnosis found.   Plan: Patient is to use CBT, mindfulness and coping skills,  Patient to follow through with coping skills discussed. Patient to continue to be mindful of his communication and maintaining boundaries to keep stress more manageable.    Long-term goal:    Reduce overall level, frequency, and intensity of the feelings of depression, anxiety and panic evidenced by decreased irritability, negative self talk  from 6 to 7 days/week to 0 to 1 days/week per client report for at least 3 consecutive months.    Short-term goal:  Verbally express understanding of the relationship between feelings of depression, anxiety and their impact on thinking patterns and behaviors. Verbalize an understanding of the role that  distorted thinking plays in creating fears, excessive worry, and ruminations. Improve effective communication skills to reduce relational stress Decrease irritability frequency and anger outbursts  Progress: Progressing  Lonni Fischer, [facility]

## 2023-12-20 ENCOUNTER — Other Ambulatory Visit (HOSPITAL_COMMUNITY): Payer: Self-pay

## 2023-12-20 ENCOUNTER — Encounter: Payer: Self-pay | Admitting: Internal Medicine

## 2023-12-20 MED ORDER — WEGOVY 2.4 MG/0.75ML ~~LOC~~ SOAJ
2.4000 mg | SUBCUTANEOUS | 0 refills | Status: DC
  Filled 2023-12-20: qty 3, 28d supply, fill #0

## 2023-12-26 ENCOUNTER — Telehealth (INDEPENDENT_AMBULATORY_CARE_PROVIDER_SITE_OTHER): Payer: Self-pay

## 2024-01-02 ENCOUNTER — Other Ambulatory Visit (HOSPITAL_COMMUNITY): Payer: Self-pay

## 2024-01-02 MED ORDER — WEGOVY 2.4 MG/0.75ML ~~LOC~~ SOAJ
2.4000 mg | SUBCUTANEOUS | 0 refills | Status: DC
  Filled 2024-01-02 – 2024-01-12 (×2): qty 3, 28d supply, fill #0

## 2024-01-03 ENCOUNTER — Ambulatory Visit (INDEPENDENT_AMBULATORY_CARE_PROVIDER_SITE_OTHER): Admitting: Mental Health

## 2024-01-03 DIAGNOSIS — F411 Generalized anxiety disorder: Secondary | ICD-10-CM

## 2024-01-03 NOTE — Progress Notes (Signed)
 [facility] Psych[facility]  Name: Christian [name] Date:  12/2023 MRN: [id number] DOB: July 08, 1975 PCP: Loreli Elsie [name], MD  Time spent:  50 minutes Time in: 8:00 a.m.    time out:8:50a.m.  Treatment: Individual Therapy  Mental Status Exam:    Appearance:   Casual     Behavior:  Appropriate  Motor:  Normal  Speech/Language:   Clear and Coherent    Affect:  Full range  Mood:    Euthymic  Thought process:  normal  Thought content:    WNL  Sensory/Perceptual disturbances:    WNL  Orientation:  x4  Attention:  Good  Concentration:  Good  Memory:  intact  Fund of knowledge:   WNL  Insight:    good  Judgment:   Good  Impulse Control:  Good   Reported Symptoms:  Irritable, depressed, anxiety, anger outbursts (verbal), tearful, feelings of inadequacy  Risk Assessment:  Danger to Self:  No Self-injurious Behavior: No Danger to Others: No Duty to Warn:no Physical Aggression / Violence:No  Access to Firearms a concern: No  Gang Involvement:No  Patient / guardian was educated about steps to take if suicide or homicide risk level increases between visits: yes While future psychiatric events cannot be accurately predicted, the patient does not currently require acute inpatient psychiatric care and does not currently meet [place]  involuntary commitment criteria.  Subjective:  Patient presents for today's session. Patient shared progress. We stated that he and his wife continued to work on their relationship, got into a mild argument this morning about him leaving the house to go to his appointments. He stated that she was leaving in the afternoon to see their granddaughter but I told him that he always leaves when she does, always has to be on the go. He stated that she's also referenced the past, while working and the company he would be gone during the day, her often wanting to know how many appointments and where they were at. He stated that she is commented on  his always being gone over those years. Ways to communicate in a relationship was explored. Utilizing active listening and discussing how this can be an outlet for him to express his emotions as opposed to being reactive as he agreed his anger tends to build over time.   Intervention: supportive therapy, motivational interviewing    Diagnoses:    ICD-10-CM   1. Generalized anxiety disorder  F41.1        Plan: Patient is to use CBT, mindfulness and coping skills,  Patient to follow through with coping skills discussed. Patient to continue to be mindful of his communication and maintaining boundaries to keep stress more manageable.    Long-term goal:    Reduce overall level, frequency, and intensity of the feelings of depression, anxiety and panic evidenced by decreased irritability, negative self talk  from 6 to 7 days/week to 0 to 1 days/week per client report for at least 3 consecutive months.    Short-term goal:  Verbally express understanding of the relationship between feelings of depression, anxiety and their impact on thinking patterns and behaviors. Verbalize an understanding of the role that distorted thinking plays in creating fears, excessive worry, and ruminations. Improve effective communication skills to reduce relational stress Decrease irritability frequency and anger outbursts  Progress: Progressing  Lonni Fischer, [facility]

## 2024-01-12 ENCOUNTER — Other Ambulatory Visit: Payer: Self-pay

## 2024-01-12 ENCOUNTER — Other Ambulatory Visit (HOSPITAL_COMMUNITY): Payer: Self-pay

## 2024-01-16 ENCOUNTER — Ambulatory Visit: Admitting: Mental Health

## 2024-01-30 ENCOUNTER — Ambulatory Visit (INDEPENDENT_AMBULATORY_CARE_PROVIDER_SITE_OTHER): Admitting: Mental Health

## 2024-01-30 DIAGNOSIS — F411 Generalized anxiety disorder: Secondary | ICD-10-CM | POA: Diagnosis not present

## 2024-01-30 NOTE — Progress Notes (Signed)
 [facility] Psych[facility]  Name: Christian [name] Date: 01/30/2024 MRN: [id number] DOB: Sep 19, 1975 PCP: Christian [name]., MD  Time spent:  49 minutes Time in: 9: 00 a.m. time out:: 4 9 AM  Treatment: Individual Therapy  Mental Status Exam:    Appearance:   Casual     Behavior:  Appropriate  Motor:  Normal  Speech/Language:   Clear and Coherent    Affect:  Full range  Mood:    Euthymic  Thought process:  normal  Thought content:    WNL  Sensory/Perceptual disturbances:    WNL  Orientation:  x4  Attention:  Good  Concentration:  Good  Memory:  intact  Fund of knowledge:   WNL  Insight:    good  Judgment:   Good  Impulse Control:  Good   Reported Symptoms:  Irritable, depressed, anxiety, anger outbursts (verbal), tearful, feelings of inadequacy  Risk Assessment:  Danger to Self:  No Self-injurious Behavior: No Danger to Others: No Duty to Warn:no Physical Aggression / Violence:No  Access to Firearms a concern: No  Gang Involvement:No  Patient / guardian was educated about steps to take if suicide or homicide risk level increases between visits: yes While future psychiatric events cannot be accurately predicted, the patient does not currently require acute inpatient psychiatric care and does not currently meet [place]  involuntary commitment criteria.  Subjective:  Patient presents for today's session.  Assessed progress.  Patient shared how he and his wife have concerns related to their daughter, who is currently in a residential placement.  He stated that she is to be discharged in the coming days unless the facility is able to get more days authorized.  He shared how this is because some stress due to concerns they both have about her return home.  He stated that she has made only minimal progress in the program, which also leaves them more concerned about how she will continue to struggle upon her return home.  He stated that he was able to  communicate with her therapist and how he recognizes his daughter possibly also coping with some depression.  He went on to share how he plans to take a leave of absence from his job to assist with different life adjustments if she is to return home, assist his wife more at home with their 2 foster children as he stated that her stress levels have increased recently and caused some medical issues.  He shared how they got into an argument recently due to his not wanting to at to had a birthday party with the foster child due to his anxiety in social settings.  This was further explored where he stated this has been a issue for many years.  Will plan to further discuss next visit.   Intervention: supportive therapy, motivational interviewing    Diagnoses:    ICD-10-CM   1. Generalized anxiety disorder  F41.1         Plan: Patient is to use CBT, mindfulness and coping skills,  Patient to follow through with coping skills discussed. Patient to continue to be mindful of his communication and maintaining boundaries to keep stress more manageable.    Long-term goal:    Reduce overall level, frequency, and intensity of the feelings of depression, anxiety and panic evidenced by decreased irritability, negative self talk  from 6 to 7 days/week to 0 to 1 days/week per client report for at least 3 consecutive months.    Short-term goal:  Verbally express understanding of the relationship between feelings of depression, anxiety and their impact on thinking patterns and behaviors. Verbalize an understanding of the role that distorted thinking plays in creating fears, excessive worry, and ruminations. Improve effective communication skills to reduce relational stress Decrease irritability frequency and anger outbursts  Progress: Progressing  Lonni Fischer, [facility]

## 2024-01-30 NOTE — Progress Notes (Signed)
 No charge for missed appt 01/16/24.

## 2024-02-05 ENCOUNTER — Other Ambulatory Visit (HOSPITAL_COMMUNITY): Payer: Self-pay

## 2024-02-05 MED ORDER — WEGOVY 2.4 MG/0.75ML ~~LOC~~ SOAJ
2.4000 mg | SUBCUTANEOUS | 0 refills | Status: AC
  Filled 2024-02-05: qty 3, 28d supply, fill #0

## 2024-02-12 ENCOUNTER — Ambulatory Visit (AMBULATORY_SURGERY_CENTER): Payer: Self-pay

## 2024-02-12 ENCOUNTER — Encounter: Payer: Self-pay | Admitting: Internal Medicine

## 2024-02-12 VITALS — Ht 69.0 in | Wt 245.0 lb

## 2024-02-12 DIAGNOSIS — Z1211 Encounter for screening for malignant neoplasm of colon: Secondary | ICD-10-CM

## 2024-02-12 MED ORDER — NA SULFATE-K SULFATE-MG SULF 17.5-3.13-1.6 GM/177ML PO SOLN: 1.0000 | Freq: Once | ORAL | 0 refills | Status: AC

## 2024-02-12 NOTE — Progress Notes (Signed)
 No issues known to pt with past sedation with any surgeries or procedures Patient denies ever being told they had issues or difficulty with intubation  No FH of Malignant Hyperthermia Pt is not on diet pills Pt is not on home 02  Pt is not on blood thinners  Pt denies issues with chronic constipation  No A fib or A flutter Have any cardiac testing pending--no Pt instructed to use [email or web address] or GoodRx for a price reduction on prep  Ambulates independently

## 2024-02-13 ENCOUNTER — Ambulatory Visit: Admitting: Mental Health

## 2024-02-22 ENCOUNTER — Ambulatory Visit (INDEPENDENT_AMBULATORY_CARE_PROVIDER_SITE_OTHER): Admitting: Mental Health

## 2024-02-22 DIAGNOSIS — F411 Generalized anxiety disorder: Secondary | ICD-10-CM | POA: Diagnosis not present

## 2024-02-22 NOTE — Progress Notes (Signed)
 [facility] Psych[facility] Note  Name: Christian [name] Date:   02/22/2024 MRN: [id number] DOB: 08-18-1975 PCP: Loreli Elsie [name], MD  Time spent:  47 minutes Time in: 9: 00 a.m. time out:: 9:47 AM  Treatment: Individual Therapy  Mental Status Exam:    Appearance:   Casual     Behavior:  Appropriate  Motor:  Normal  Speech/Language:   Clear and Coherent    Affect:  Full range  Mood:    Euthymic  Thought process:  normal  Thought content:    WNL  Sensory/Perceptual disturbances:    WNL  Orientation:  x4  Attention:  Good  Concentration:  Good  Memory:  intact  Fund of knowledge:   WNL  Insight:    good  Judgment:   Good  Impulse Control:  Good   Reported Symptoms:  Irritable, depressed, anxiety, anger outbursts (verbal), tearful, feelings of inadequacy  Risk Assessment:  Danger to Self:  No Self-injurious Behavior: No Danger to Others: No Duty to Warn:no Physical Aggression / Violence:No  Access to Firearms a concern: No  Gang Involvement:No  Patient / guardian was educated about steps to take if suicide or homicide risk level increases between visits: yes While future psychiatric events cannot be accurately predicted, the patient does not currently require acute inpatient psychiatric care and does not currently meet [place]  involuntary commitment criteria.  Subjective:  Patient presents for today's session.   Patient shared recent events, progress.  He stated that he has been under more stress, anxiety recently sharing recent events.  He stated that his daughter is back home from her treatment center.  He stated that due to her behaviors she was discharged.  He stated that since she has been back home has been challenging, reporting how it brings a level of stress to the family most notably to his wife where he states she has a tendency to shut down.  He stated they were able to find a facility in [place]  that provides treatment specific to her medical  diagnosis.  He stated they learned recently that she struggles with a medical disorder that causes an insatiable appetite.  He stated that he is hopeful that she can be admitted to this treatment center per insurance authorization which he states has been challenging to obtain thus far.  He stated that they are additionally looking for an intermediary replacement prior to that admission, also awaiting insurance approval.  He plans to start the legal process to obtain power of attorney for his daughter.  He shared how they continue to care for the 2 foster children, stated that his wife has made even more of a connection with the younger child with him he is closely connected.  Explored ways to cope and care for himself where he plans to follow-up with his own medical procedures over the next few weeks, taking time out of work for a 10-week period.   Intervention: supportive therapy, motivational interviewing    Diagnoses:    ICD-10-CM   1. Generalized anxiety disorder  F41.1          Plan: Patient is to use CBT, mindfulness and coping skills,  Patient to follow through with coping skills discussed. Patient to continue to be mindful of his communication and maintaining boundaries to keep stress more manageable.    Long-term goal:    Reduce overall level, frequency, and intensity of the feelings of depression, anxiety and panic evidenced by decreased irritability, negative self talk  from 6 to 7 days/week to 0 to 1 days/week per client report for at least 3 consecutive months.    Short-term goal:  Verbally express understanding of the relationship between feelings of depression, anxiety and their impact on thinking patterns and behaviors. Verbalize an understanding of the role that distorted thinking plays in creating fears, excessive worry, and ruminations. Improve effective communication skills to reduce relational stress Decrease irritability frequency and anger outbursts  Progress:  Progressing  Lonni Fischer, [facility]

## 2024-02-26 ENCOUNTER — Encounter: Payer: Self-pay | Admitting: Internal Medicine

## 2024-02-26 ENCOUNTER — Ambulatory Visit (AMBULATORY_SURGERY_CENTER): Payer: Self-pay | Admitting: Internal Medicine

## 2024-02-26 VITALS — BP 117/73 | HR 73 | Temp 97.7°F | Resp 14 | Ht 69.0 in | Wt 245.0 lb

## 2024-02-26 DIAGNOSIS — D123 Benign neoplasm of transverse colon: Secondary | ICD-10-CM | POA: Diagnosis not present

## 2024-02-26 DIAGNOSIS — K635 Polyp of colon: Secondary | ICD-10-CM | POA: Diagnosis not present

## 2024-02-26 DIAGNOSIS — Z1211 Encounter for screening for malignant neoplasm of colon: Secondary | ICD-10-CM

## 2024-02-26 DIAGNOSIS — K648 Other hemorrhoids: Secondary | ICD-10-CM | POA: Diagnosis not present

## 2024-02-26 DIAGNOSIS — D122 Benign neoplasm of ascending colon: Secondary | ICD-10-CM

## 2024-02-26 MED ORDER — SODIUM CHLORIDE 0.9 % IV SOLN: 500.0000 mL | Freq: Once | INTRAVENOUS | Status: DC

## 2024-02-26 NOTE — Op Note (Signed)
 [facility] Patient Name: Christian [name] Procedure Date: 02/26/2024 11:08 AM MRN: [id number] Endoscopist: Gordy CHRISTELLA Starch , MD, [id number] Age: 48 Referring MD:  Date of Birth: 03/17/1976 Gender: Male Account #: [id number] Procedure:                Colonoscopy Indications:              Screening for colorectal malignant neoplasm Medicines:                Monitored Anesthesia Care Procedure:                Pre-Anesthesia Assessment:                           - Prior to the procedure, a History and Physical                            was performed, and patient medications and                            allergies were reviewed. The patient's tolerance of                            previous anesthesia was also reviewed. The risks                            and benefits of the procedure and the sedation                            options and risks were discussed with the patient.                            All questions were answered, and informed consent                            was obtained. Prior Anticoagulants: The patient has                            taken no anticoagulant or antiplatelet agents. ASA                            Grade Assessment: II - A patient with mild systemic                            disease. After reviewing the risks and benefits,                            the patient was deemed in satisfactory condition to                            undergo the procedure.                           After obtaining informed consent, the colonoscope  was passed under direct vision. Throughout the                            procedure, the patient's blood pressure, pulse, and                            oxygen saturations were monitored continuously. The                            CF [id number] #[id number] was introduced through the anus                            and advanced to the cecum, identified by                            appendiceal orifice and  ileocecal valve. The                            colonoscopy was performed without difficulty. The                            patient tolerated the procedure well. The quality                            of the bowel preparation was good. The ileocecal                            valve, appendiceal orifice, and rectum were                            photographed. Scope In: 11:28:38 AM Scope Out: 11:50:07 AM Scope Withdrawal Time: 0 hours 11 minutes 33 seconds  Total Procedure Duration: 0 hours 21 minutes 29 seconds  Findings:                 The digital rectal exam was normal.                           A 5 mm polyp was found in the ascending colon. The                            polyp was sessile. The polyp was removed with a                            cold snare. Resection and retrieval were complete.                           Two sessile polyps were found in the transverse                            colon. The polyps were 5 to 7 mm in size. These                            polyps were removed  with a cold snare. Resection                            and retrieval were complete.                           Internal hemorrhoids were found during                            retroflexion. The hemorrhoids were small. Complications:            No immediate complications. Estimated Blood Loss:     Estimated blood loss: none. Impression:               - One 5 mm polyp in the ascending colon, removed                            with a cold snare. Resected and retrieved.                           - Two 5 to 7 mm polyps in the transverse colon,                            removed with a cold snare. Resected and retrieved.                           - Small internal hemorrhoids. Recommendation:           - Patient has a contact number available for                            emergencies. The signs and symptoms of potential                            delayed complications were discussed with the                             patient. Return to normal activities tomorrow.                            Written discharge instructions were provided to the                            patient.                           - Resume previous diet.                           - Continue present medications.                           - Await pathology results.                           - Repeat colonoscopy is recommended for  surveillance. The colonoscopy date will be                            determined after pathology results from today's                            exam become available for review. Gordy CHRISTELLA Starch, MD 02/26/2024 11:53:07 AM This report has been signed electronically.

## 2024-02-26 NOTE — Progress Notes (Signed)
 Called to room to assist during endoscopic procedure.  Patient ID and intended procedure confirmed with present staff. Received instructions for my participation in the procedure from the performing physician.

## 2024-02-26 NOTE — Progress Notes (Signed)
 Report to PACU, RN, vss, BBS= Clear.

## 2024-02-26 NOTE — Progress Notes (Signed)
 GASTROENTEROLOGY PROCEDURE H&P NOTE   Primary Care Physician: Loreli Elsie [name], MD    Reason for Procedure:  Colon cancer screening  Plan:    Colonoscopy  Patient is appropriate for endoscopic procedure(s) in the ambulatory ([facility]) setting.  The nature of the procedure, as well as the risks, benefits, and alternatives were carefully and thoroughly reviewed with the patient. Ample time for discussion and questions allowed. The patient understood, was satisfied, and agreed to proceed.     HPI: Christian [name] is a 48 y.o. male who presents for screening colonoscopy.  Medical history as below.  Tolerated the prep.  No recent chest pain or shortness of breath.  No abdominal pain today.  Past Medical History:  Diagnosis Date   Abdominal mass 2022   Pt had an abdominal mass abcess treated with antibiotics. Mass reoccurred. Surgery was done to remove abcess on 04/06/21 by Dr. Elspeth Schultze.   Depression    Only during Covid. Pt is better and off all medication as of 04/23/2019.   Family history of adverse reaction to anesthesia    Pt stated that his mother has had severe N & V after anesthesia. He has never had a problem per pt.   Hyperlipidemia    Hypertension    Meningitis    spinal meningitis 1980   Metabolic syndrome    Migraine headache    hx of chronic migraines   Obesity    Other intervertebral disc degeneration, thoracic region    Patient has back pain due to an old injury. He received an epidural injection in his back on 04/22/21.   Prediabetes    last blood drawn in 01/2021 showed prediabetes per pt   Sleep apnea    Patient was diagnosed with sleep apnea from a 02/04/2019 home study. He was unable to tolerate a CPAP.   Snoring    Wears glasses     Past Surgical History:  Procedure Laterality Date   ABDOMINAL SURGERY  04/06/2021   For abdominal abcess   APPENDECTOMY     COLONOSCOPY     CYSTOSCOPY  1994   Pt stated that his kidneys were bruised when he was a  teenager. He had blood in his urine. His doctor went up into his urethra and did some cauterization.   CYSTOSCOPY WITH RETROGRADE PYELOGRAM, URETEROSCOPY AND STENT PLACEMENT Left 04/23/2021   Procedure: CYSTOSCOPY WITH RETROGRADE PYELOGRAM, URETEROSCOPY , STONE EXTRACTION AND STENT PLACEMENT;  Surgeon: Matilda Senior, MD;  Location: [facility];  Service: Urology;  Laterality: Left;   UMBILICAL HERNIA REPAIR  2016    Prior to Admission medications   Medication Sig Start Date End Date Taking? Authorizing Provider  DULoxetine  (CYMBALTA ) 60 MG capsule Take 1 capsule (60 mg total) by mouth daily at supper 08/04/23  Yes   escitalopram  (LEXAPRO ) 20 MG tablet Take 20 mg by mouth daily. 01/28/20  Yes [provider]  ezetimibe (ZETIA) 10 MG tablet Take 10 mg by mouth daily.   Yes [provider]  PERCOCET 10-325 MG tablet Take 1 tablet by mouth every 4 (four) hours as needed. 04/26/23  Yes [provider]  rosuvastatin (CRESTOR) 10 MG tablet Take 10 mg by mouth at bedtime.   Yes [provider]  valsartan -hydrochlorothiazide  (DIOVAN -HCT) 160-25 MG tablet Take 1 tablet by mouth daily. 04/26/23  Yes   Fremanezumab -vfrm (AJOVY ) 225 MG/1.5ML SOSY Inject 1.5 mLs (225 mg) into the skin every 30 (thirty) days. 07/07/23  Rimegepant Sulfate (NURTEC) 75 MG TBDP TAKE 75 MG BY MOUTH DAILY AS NEEDED. 11/30/21   [provider]  rizatriptan (MAXALT) 10 MG tablet Take 10 mg by mouth as needed. 01/31/19   [provider]  WEGOVY  2.4 MG/0.75ML SOAJ SQ injection Inject 2.4 mg into the skin once a week. 02/05/24       Current Outpatient Medications  Medication Sig Dispense Refill   DULoxetine  (CYMBALTA ) 60 MG capsule Take 1 capsule (60 mg total) by mouth daily at supper 30 capsule 11   escitalopram  (LEXAPRO ) 20 MG tablet Take 20 mg by mouth daily.     ezetimibe (ZETIA) 10 MG tablet Take 10 mg by mouth daily.     PERCOCET 10-325 MG tablet Take 1  tablet by mouth every 4 (four) hours as needed.     rosuvastatin (CRESTOR) 10 MG tablet Take 10 mg by mouth at bedtime.     valsartan -hydrochlorothiazide  (DIOVAN -HCT) 160-25 MG tablet Take 1 tablet by mouth daily. 90 tablet 3   Fremanezumab -vfrm (AJOVY ) 225 MG/1.5ML SOSY Inject 1.5 mLs (225 mg) into the skin every 30 (thirty) days. 1.5 mL 11   Rimegepant Sulfate (NURTEC) 75 MG TBDP TAKE 75 MG BY MOUTH DAILY AS NEEDED.     rizatriptan (MAXALT) 10 MG tablet Take 10 mg by mouth as needed.     WEGOVY  2.4 MG/0.75ML SOAJ SQ injection Inject 2.4 mg into the skin once a week. 3 mL 0   Current Facility-Administered Medications  Medication Dose Route Frequency Provider Last Rate Last Admin   0.9 %  sodium chloride  infusion  500 mL Intravenous Once Hollie Wojahn, Gordy HERO, MD        Allergies as of 02/26/2024 - Review Complete 02/26/2024  Allergen Reaction Noted   Lisinopril Cough 07/05/2022    Family History  Problem Relation Age of Onset   Fibromyalgia Mother    Neurodegenerative disease Mother    CVA Father    CAD Father    Diabetes Father    Hypertension Father    Obesity Father    Liver cancer Maternal Grandfather    Heart attack Maternal Grandfather    Lung disease Paternal Grandmother    Heart attack Paternal Grandfather    Diabetes Paternal Grandfather    Colon cancer Neg Hx    Esophageal cancer Neg Hx    Stomach cancer Neg Hx    Rectal cancer Neg Hx    Colon polyps Neg Hx     Social History   Socioeconomic History   Marital status: Married    Spouse name: Not on file   Number of children: Not on file   Years of education: Not on file   Highest education level: Not on file  Occupational History   Not on file  Tobacco Use   Smoking status: Never   Smokeless tobacco: Never  Vaping Use   Vaping status: Never Used  Substance and Sexual Activity   Alcohol  use: Not Currently   Drug use: No   Sexual activity: Not on file  Other Topics Concern   Not on file  Social History  Narrative   Not on file   Social Drivers of Health   Financial Resource Strain: Low Risk  (06/23/2022)   [facility]   Overall Financial Resource Strain (CARDIA)    Difficulty of Paying Living Expenses: Not hard at all  Food Insecurity: No Food Insecurity (06/23/2022)   Received from [facility]   Hunger Vital Sign  Within the past 12 months, you worried that your food would run out before you got the money to buy more.: Never true    Within the past 12 months, the food you bought just didn't last and you didn't have money to get more.: Never true  Transportation Needs: No Transportation Needs (06/23/2022)   Received from [facility] - Transportation    Lack of Transportation (Medical): No    Lack of Transportation (Non-Medical): No  Physical Activity: Unknown (06/23/2022)   Received from [facility]   Exercise Vital Sign    On average, how many days per week do you engage in moderate to strenuous exercise (like a brisk walk)?: 0 days    Minutes of Exercise per Session: Not on file  Recent Concern: Physical Activity - Inactive (06/23/2022)   Received from [facility]   Exercise Vital Sign    Days of Exercise per Week: 0 days    Minutes of Exercise per Session: 0 min  Stress: No Stress Concern Present (06/23/2022)   Received from [facility] - Occupational Stress Questionnaire    Feeling of Stress : Only a little  Social Connections: Socially Isolated (06/23/2022)   Received from [facility]   Social Network    How would you rate your social network (family, work, friends)?: Little participation, lonely and socially isolated  Intimate Partner Violence: Not At Risk (06/23/2022)   Received from [facility]   HITS    Over the last 12 months how often did your partner physically hurt you?: Never    Over the last 12 months how often did your partner insult you or talk down to you?: Never    Over the last 12  months how often did your partner threaten you with physical harm?: Never    Over the last 12 months how often did your partner scream or curse at you?: Rarely    Physical Exam: Vital signs in last 24 hours: @BP  (!) 148/97   Pulse 96   Temp 97.7 F (36.5 C) (Temporal)   Ht 5' 9 (1.753 m)   Wt 245 lb (111.1 kg)   SpO2 97%   BMI 36.18 kg/m  GEN: NAD EYE: Sclerae anicteric ENT: MMM CV: Non-tachycardic Pulm: CTA b/l GI: Soft, NT/ND NEURO:  Alert & Oriented x 3   Gordy Starch, MD [facility]  02/26/2024 11:19 AM

## 2024-02-26 NOTE — Progress Notes (Signed)
 Pt's states no medical or surgical changes since previsit or office visit.

## 2024-02-26 NOTE — Patient Instructions (Signed)
 Resume previous diet  Continue present medications Await pathology results  Repeat colonoscopy is recommended. Colonoscopy date to be determined based on pathology results.  See handouts for hemorrhoids and polyps YOU HAD AN ENDOSCOPIC PROCEDURE TODAY AT THE [facility]:   Refer to the procedure report that was given to you for any specific questions about what was found during the examination.  If the procedure report does not answer your questions, please call your gastroenterologist to clarify.  If you requested that your care partner not be given the details of your procedure findings, then the procedure report has been included in a sealed envelope for you to review at your convenience later.  YOU SHOULD EXPECT: Some feelings of bloating in the abdomen. Passage of more gas than usual.  Walking can help get rid of the air that was put into your GI tract during the procedure and reduce the bloating. If you had a lower endoscopy (such as a colonoscopy or flexible sigmoidoscopy) you may notice spotting of blood in your stool or on the toilet paper. If you underwent a bowel prep for your procedure, you may not have a normal bowel movement for a few days.  Please Note:  You might notice some irritation and congestion in your nose or some drainage.  This is from the oxygen used during your procedure.  There is no need for concern and it should clear up in a day or so.  SYMPTOMS TO REPORT IMMEDIATELY: Following lower endoscopy (colonoscopy or flexible sigmoidoscopy):  Excessive amounts of blood in the stool  Significant tenderness or worsening of abdominal pains  Swelling of the abdomen that is new, acute  Fever of 100F or higher  For urgent or emergent issues, a gastroenterologist can be reached at any hour by calling [phone]. Do not use MyChart messaging for urgent concerns.   DIET:  We do recommend a small meal at first, but then you may proceed to your regular diet.  Drink  plenty of fluids but you should avoid alcoholic beverages for 24 hours.  ACTIVITY:  You should plan to take it easy for the rest of today and you should NOT DRIVE or use heavy machinery until tomorrow (because of the sedation medicines used during the test).    FOLLOW UP: Our staff will call the number listed on your records the next business day following your procedure.  We will call around 7:15- 8:00 am to check on you and address any questions or concerns that you may have regarding the information given to you following your procedure. If we do not reach you, we will leave a message.     If any biopsies were taken you will be contacted by phone or by letter within the next 1-3 weeks.  Please call us  at [phone] if you have not heard about the biopsies in 3 weeks.   SIGNATURES/CONFIDENTIALITY: You and/or your care partner have signed paperwork which will be entered into your electronic medical record.  These signatures attest to the fact that that the information above on your After Visit Summary has been reviewed and is understood.  Full responsibility of the confidentiality of this discharge information lies with you and/or your care-partner.

## 2024-02-27 ENCOUNTER — Telehealth: Payer: Self-pay

## 2024-02-27 ENCOUNTER — Ambulatory Visit: Admitting: Mental Health

## 2024-02-27 NOTE — Telephone Encounter (Signed)
 Follow up call to pt, no answer.

## 2024-02-29 ENCOUNTER — Ambulatory Visit: Payer: Self-pay | Admitting: Internal Medicine

## 2024-02-29 LAB — SURGICAL PATHOLOGY

## 2024-03-12 ENCOUNTER — Ambulatory Visit: Admitting: Mental Health

## 2024-03-18 ENCOUNTER — Other Ambulatory Visit (HOSPITAL_COMMUNITY): Payer: Self-pay

## 2024-03-18 MED ORDER — MOUNJARO 7.5 MG/0.5ML ~~LOC~~ SOAJ
7.5000 mg | SUBCUTANEOUS | 0 refills | Status: AC
  Filled 2024-03-18: qty 2, 28d supply, fill #0

## 2024-03-26 ENCOUNTER — Ambulatory Visit: Admitting: Mental Health

## 2024-03-26 DIAGNOSIS — F411 Generalized anxiety disorder: Secondary | ICD-10-CM

## 2024-03-26 NOTE — Progress Notes (Signed)
 [facility] Psychotherapy Note  Name: Christian [name] Date: 03/26/2024 MRN: [id number] DOB: Aug 14, 1975 PCP: Loreli Elsie [name], MD  Time spent:  49 minutes Time in: 9: 00 a.m. time out:: 9:49 AM  Treatment: Individual Therapy  Mental Status Exam:    Appearance:   Casual     Behavior:  Appropriate  Motor:  Normal  Speech/Language:   Clear and Coherent    Affect:  Full range  Mood:    Euthymic  Thought process:  normal  Thought content:    WNL  Sensory/Perceptual disturbances:    WNL  Orientation:  x4  Attention:  Good  Concentration:  Good  Memory:  intact  Fund of knowledge:   WNL  Insight:    good  Judgment:   Good  Impulse Control:  Good   Reported Symptoms:  Irritable, depressed, anxiety, anger outbursts (verbal), tearful, feelings of inadequacy  Risk Assessment:  Danger to Self:  No Self-injurious Behavior: No Danger to Others: No Duty to Warn:no Physical Aggression / Violence:No  Access to Firearms a concern: No  Gang Involvement:No  Patient / guardian was educated about steps to take if suicide or homicide risk level increases between visits: yes While future psychiatric events cannot be accurately predicted, the patient does not currently require acute inpatient psychiatric care and does not currently meet   involuntary commitment criteria.  Subjective:  Patient presents for today's session.  Assessed progress with patient shared challenging recent events.  He stated that they were able to have their daughter placed in a facility.  He went on to share details and how this has been a relief for him, reducing some stress levels.  Family share how he tries to keep up with household tasks, does the grocery store, cooks most evenings and tries to keep up with other tasks.  He continues to have chronic back pain, was hopeful to have back surgery although this was denied by insurance.  He reports his pain levels are daily, at night has difficulty  sleeping.   Explored collaboratively stress levels in his marital relationship, encouraged him to consider some ways to set boundaries in the relationship with his wife as he identified needing more help, although he stated that he often avoids more significant discussions in this area to avoid arguments.  Provide support and encouragement for him to make time for himself, identify ways to destress, outlets for some enjoyment.   Intervention: supportive therapy, motivational interviewing    Diagnoses:    ICD-10-CM   1. Generalized anxiety disorder  F41.1           Plan: Patient is to use CBT, mindfulness and coping skills,  Patient to follow through with coping skills discussed. Patient to continue to be mindful of his communication and maintaining boundaries to keep stress more manageable.    Long-term goal:    Reduce overall level, frequency, and intensity of the feelings of depression, anxiety and panic evidenced by decreased irritability, negative self talk  from 6 to 7 days/week to 0 to 1 days/week per client report for at least 3 consecutive months.    Short-term goal:  Verbally express understanding of the relationship between feelings of depression, anxiety and their impact on thinking patterns and behaviors. Verbalize an understanding of the role that distorted thinking plays in creating fears, excessive worry, and ruminations. Improve effective communication skills to reduce relational stress Decrease irritability frequency and anger outbursts  Progress: Progressing  Lonni Fischer, [facility]

## 2024-04-09 ENCOUNTER — Ambulatory Visit (INDEPENDENT_AMBULATORY_CARE_PROVIDER_SITE_OTHER): Admitting: Mental Health

## 2024-04-09 DIAGNOSIS — F411 Generalized anxiety disorder: Secondary | ICD-10-CM

## 2024-04-09 NOTE — Progress Notes (Signed)
 [facility] Psychotherapy Note  Name: Christian [name] Date: 04/09/2024 MRN: [id number] DOB: [birth year] PCP: Loreli Elsie [name], MD  Time spent:  49 minutes Time in: 9: 00 a.m. time out:: 9:49 AM  Treatment: Individual Therapy  Mental Status Exam:    Appearance:   Casual     Behavior:  Appropriate  Motor:  Normal  Speech/Language:   Clear and Coherent    Affect:  Full range  Mood:    Euthymic  Thought process:  normal  Thought content:    WNL  Sensory/Perceptual disturbances:    WNL  Orientation:  x4  Attention:  Good  Concentration:  Good  Memory:  intact  Fund of knowledge:   WNL  Insight:    good  Judgment:   Good  Impulse Control:  Good   Reported Symptoms:  Irritable, depressed, anxiety, anger outbursts (verbal), tearful, feelings of inadequacy  Risk Assessment:  Danger to Self:  No Self-injurious Behavior: No Danger to Others: No Duty to Warn:no Physical Aggression / Violence:No  Access to Firearms a concern: No  Gang Involvement:No  Patient / guardian was educated about steps to take if suicide or homicide risk level increases between visits: yes While future psychiatric events cannot be accurately predicted, the patient does not currently require acute inpatient psychiatric care and does not currently meet [place]  involuntary commitment criteria.  Subjective:  Patient presents for today's session.  Assessed progress where patient shared ongoing challenges with chronic pain related to his back.  At this point, he does not feel able to return to work due to his medical issues.  Other changes he has learned that have occurred at his job further complicate the idea of his returning due to his concerns about his ability to perform his job physically.  Other stressors were discussed, some ongoing marital stress, ways he is trying to communicate effectively with managing irritability.  He continues to acknowledge and also question how his wife often  sharing how she is stressed.  He went on to share how he tries to be supportive, but of trying to keep up with household tasks, his pain levels can increase, leaving him eventually unable to keep going on.  He expressed concern about his future due to his medical issues and how it is also causing some stress and anxiety for his wife.,  Spent providing support, understanding facilitating his identifying needs and ways to cope and care for himself.    Intervention: supportive therapy, motivational interviewing    Diagnoses:    ICD-10-CM   1. Generalized anxiety disorder  F41.1        Plan: Patient is to use CBT, mindfulness and coping skills,  Patient to follow through with coping skills discussed. Patient to continue to be mindful of his communication and maintaining boundaries to keep stress more manageable.    Long-term goal:    Reduce overall level, frequency, and intensity of the feelings of depression, anxiety and panic evidenced by decreased irritability, negative self talk  from 6 to 7 days/week to 0 to 1 days/week per client report for at least 3 consecutive months.    Short-term goal:  Verbally express understanding of the relationship between feelings of depression, anxiety and their impact on thinking patterns and behaviors. Verbalize an understanding of the role that distorted thinking plays in creating fears, excessive worry, and ruminations. Improve effective communication skills to reduce relational stress Decrease irritability frequency and anger outbursts  Progress: Progressing  Lonni Fischer, [facility]

## 2024-04-10 ENCOUNTER — Other Ambulatory Visit (HOSPITAL_COMMUNITY): Payer: Self-pay

## 2024-04-10 MED ORDER — MOUNJARO 10 MG/0.5ML ~~LOC~~ SOAJ
10.0000 mg | SUBCUTANEOUS | 0 refills | Status: AC
  Filled 2024-04-10: qty 2, 28d supply, fill #0

## 2024-04-23 ENCOUNTER — Ambulatory Visit: Admitting: Mental Health

## 2024-05-07 ENCOUNTER — Ambulatory Visit: Admitting: Mental Health

## 2024-05-14 ENCOUNTER — Other Ambulatory Visit (HOSPITAL_COMMUNITY): Payer: Self-pay

## 2024-05-14 MED ORDER — MOUNJARO 12.5 MG/0.5ML ~~LOC~~ SOAJ
12.5000 mg | SUBCUTANEOUS | 0 refills | Status: AC
  Filled 2024-05-14: qty 2, 28d supply, fill #0

## 2024-05-21 ENCOUNTER — Ambulatory Visit (INDEPENDENT_AMBULATORY_CARE_PROVIDER_SITE_OTHER): Admitting: Mental Health

## 2024-05-21 DIAGNOSIS — F411 Generalized anxiety disorder: Secondary | ICD-10-CM | POA: Diagnosis not present

## 2024-05-21 NOTE — Progress Notes (Signed)
 [facility] Psychotherapy Note  Name: Christian [name] Date: 05/21/2024 MRN: [id number] DOB: [birth year] PCP: Loreli Elsie [name], MD  Time spent: 50 minutes Time in: 9: 00 a.m. time out:: 9:50 AM  Treatment: Individual Therapy  Mental Status Exam:    Appearance:   Casual     Behavior:  Appropriate  Motor:  Normal  Speech/Language:   Clear and Coherent    Affect:  Full range  Mood:    Euthymic  Thought process:  normal  Thought content:    WNL  Sensory/Perceptual disturbances:    WNL  Orientation:  x4  Attention:  Good  Concentration:  Good  Memory:  intact  Fund of knowledge:   WNL  Insight:    good  Judgment:   Good  Impulse Control:  Good   Reported Symptoms:  Irritable, depressed, anxiety, anger outbursts (verbal), tearful, feelings of inadequacy  Risk Assessment:  Danger to Self:  No Self-injurious Behavior: No Danger to Others: No Duty to Warn:no Physical Aggression / Violence:No  Access to Firearms a concern: No  Gang Involvement:No  Patient / guardian was educated about steps to take if suicide or homicide risk level increases between visits: yes While future psychiatric events cannot be accurately predicted, the patient does not currently require acute inpatient psychiatric care and does not currently meet [place]  involuntary commitment criteria.  Subjective:  Patient presents for today's session.  Patient shared progress over the last couple of months since his last visit. He stated that he is now no longer working full time due to his medical issues, now on disability.  He stated this has been impactful to the family as they will have to adjust their lifestyle.  He stated that his wife's been more mindful of spending recently.  He stated that his son, who is soon to graduate from college has been more anxious as a result.  He shared some details related to recent discussions with his son, wanting him to actively take steps toward independence after  graduation but also assuring him that he has a place to stay at their home if needed.  He went on to share how he and his wife have had discussions regarding their foster children, at this point patient maintains that he does not want to let the infant they are caring for go back to their biological mother due to concerns about instability.  Patient expressed commitment toward providing continued care.   Intervention: supportive therapy, motivational interviewing    Diagnoses:  No diagnosis found.    Plan: Patient is to use CBT, mindfulness and coping skills,  Patient to follow through with coping skills discussed. Patient to continue to be mindful of his communication and maintaining boundaries to keep stress more manageable.    Long-term goal:    Reduce overall level, frequency, and intensity of the feelings of depression, anxiety and panic evidenced by decreased irritability, negative self talk  from 6 to 7 days/week to 0 to 1 days/week per client report for at least 3 consecutive months.    Short-term goal:  Verbally express understanding of the relationship between feelings of depression, anxiety and their impact on thinking patterns and behaviors. Verbalize an understanding of the role that distorted thinking plays in creating fears, excessive worry, and ruminations. Improve effective communication skills to reduce relational stress Decrease irritability frequency and anger outbursts  Progress: Progressing  Lonni Fischer, [facility]

## 2024-06-04 ENCOUNTER — Ambulatory Visit: Admitting: Mental Health

## 2024-06-04 NOTE — Progress Notes (Signed)
 No chg for late cn 06/04/24.

## 2024-06-18 ENCOUNTER — Ambulatory Visit: Admitting: Mental Health

## 2024-06-18 DIAGNOSIS — F411 Generalized anxiety disorder: Secondary | ICD-10-CM

## 2024-06-20 ENCOUNTER — Other Ambulatory Visit (HOSPITAL_COMMUNITY): Payer: Self-pay

## 2024-06-20 MED ORDER — MOUNJARO 15 MG/0.5ML ~~LOC~~ SOAJ
15.0000 mg | SUBCUTANEOUS | 0 refills | Status: AC
  Filled 2024-06-20: qty 2, 28d supply, fill #0

## 2024-07-02 ENCOUNTER — Ambulatory Visit: Admitting: Mental Health

## 2024-07-16 ENCOUNTER — Ambulatory Visit: Admitting: Mental Health

## 2024-07-30 ENCOUNTER — Ambulatory Visit: Admitting: Mental Health

## 2024-08-13 ENCOUNTER — Ambulatory Visit: Admitting: Mental Health
